# Patient Record
Sex: Male | Born: 1935 | Race: White | Hispanic: No | Marital: Married | Smoking: Never smoker
Health system: Southern US, Community
[De-identification: ages and names within clinical notes are randomized; demographics above are authoritative.]

## PROBLEM LIST (undated history)

## (undated) DIAGNOSIS — R112 Nausea with vomiting, unspecified: Secondary | ICD-10-CM

## (undated) DIAGNOSIS — M1990 Unspecified osteoarthritis, unspecified site: Secondary | ICD-10-CM

## (undated) DIAGNOSIS — E559 Vitamin D deficiency, unspecified: Secondary | ICD-10-CM

## (undated) DIAGNOSIS — M5410 Radiculopathy, site unspecified: Secondary | ICD-10-CM

## (undated) DIAGNOSIS — I2510 Atherosclerotic heart disease of native coronary artery without angina pectoris: Secondary | ICD-10-CM

## (undated) DIAGNOSIS — D649 Anemia, unspecified: Secondary | ICD-10-CM

## (undated) DIAGNOSIS — I10 Essential (primary) hypertension: Secondary | ICD-10-CM

## (undated) DIAGNOSIS — Z9889 Other specified postprocedural states: Secondary | ICD-10-CM

## (undated) HISTORY — PX: ELBOW SURGERY: SHX618

## (undated) HISTORY — PX: BACK SURGERY: SHX140

## (undated) HISTORY — PX: CARPAL TUNNEL RELEASE: SHX101

## (undated) HISTORY — PX: ROTATOR CUFF REPAIR: SHX139

## (undated) HISTORY — PX: CORONARY ANGIOPLASTY: SHX604

## (undated) HISTORY — PX: EYE SURGERY: SHX253

---

## 1996-10-11 HISTORY — PX: CARDIAC CATHETERIZATION: SHX172

## 2011-03-26 HISTORY — PX: CARDIAC CATHETERIZATION: SHX172

## 2020-02-14 ENCOUNTER — Other Ambulatory Visit: Payer: Self-pay | Admitting: Neurosurgery

## 2020-02-14 ENCOUNTER — Ambulatory Visit
Admission: RE | Admit: 2020-02-14 | Discharge: 2020-02-14 | Disposition: A | Discharge status: Home / Self Care | Payer: Medicare Other | Source: Ambulatory Visit | Attending: Neurosurgery | Admitting: Neurosurgery

## 2020-02-14 DIAGNOSIS — M5124 Other intervertebral disc displacement, thoracic region: Secondary | ICD-10-CM

## 2020-02-22 NOTE — Pre-Procedure Instructions (Signed)
Timothy Ware  02/22/2020      [facility] [address], [place] - [address] [place] [address] Phone: [phone] Fax: [phone]    Your procedure is scheduled on Dec. 3  Report to [facility] entrance A at  9:00 A.M.  Call this number if you have problems the morning of surgery:  [phone]               If any questions prior to surgery call Pre Admission Center [phone] Monday-Friday  8 AM-4 PM   Remember:  Do not eat or drink after midnight.      Take these medicines the morning of surgery with A SIP OF WATER :             Atenolol (tenormin)            Atorvastatin (lipitor)                          7 days prior to surgery STOP taking any Aspirin (unless otherwise instructed by your surgeon), Aleve, Naproxen, Ibuprofen, Motrin, Advil, Goody's, BC's, all herbal medications, fish oil, and all vitamins.                  Follow your surgeon's instructions on when to stop Aspirin.  If no instructions were given by your surgeon then you will need to call the office to get those instructions.      Do not wear jewelry.  Do not wear lotions, powders, or perfumes, or deodorant.  Do not shave 48 hours prior to surgery.  Men may shave face and neck.  Do not bring valuables to the hospital.  [facility] is not responsible for any belongings or valuables.  Contacts, dentures or bridgework may not be worn into surgery.  Leave your suitcase in the car.  After surgery it may be brought to your room.  For patients admitted to the hospital, discharge time will be determined by your treatment team.  Patients discharged the day of surgery will not be allowed to drive home.    Special instructions:   - Preparing For Surgery  Before surgery, you can play an important role. Because skin is not sterile, your skin needs to be as free of germs as possible. You can reduce the number of germs on your skin by washing with CHG  (chlorahexidine gluconate) Soap before surgery.  CHG is an antiseptic cleaner which kills germs and bonds with the skin to continue killing germs even after washing.    Oral Hygiene is also important to reduce your risk of infection.  Remember - BRUSH YOUR TEETH THE MORNING OF SURGERY WITH YOUR REGULAR TOOTHPASTE  Please do not use if you have an allergy to CHG or antibacterial soaps. If your skin becomes reddened/irritated stop using the CHG.  Do not shave (including legs and underarms) for at least 48 hours prior to first CHG shower. It is OK to shave your face.  Please follow these instructions carefully.   1. Shower the NIGHT BEFORE SURGERY and the MORNING OF SURGERY with CHG.   2. If you chose to wash your hair, wash your hair first as usual with your normal shampoo.  3. After you shampoo, rinse your hair and body thoroughly to remove the shampoo.  4. Use CHG as you would any other liquid soap. You can apply CHG directly to the skin and wash gently  with a scrungie or a clean washcloth.   5. Apply the CHG Soap to your body ONLY FROM THE NECK DOWN.  Do not use on open wounds or open sores. Avoid contact with your eyes, ears, mouth and genitals (private parts). Wash Face and genitals (private parts)  with your normal soap.  6. Wash thoroughly, paying special attention to the area where your surgery will be performed.  7. Thoroughly rinse your body with warm water from the neck down.  8. DO NOT shower/wash with your normal soap after using and rinsing off the CHG Soap.  9. Pat yourself dry with a CLEAN TOWEL.  10. Wear CLEAN PAJAMAS to bed the night before surgery, wear comfortable clothes the morning of surgery  11. Place CLEAN SHEETS on your bed the night of your first shower and DO NOT SLEEP WITH PETS.    Day of Surgery:  Do not apply any deodorants/lotions.  Please wear clean clothes to the hospital/surgery center.   Remember to brush your teeth WITH YOUR REGULAR  TOOTHPASTE.    Please read over the following fact sheets that you were given.

## 2020-02-23 ENCOUNTER — Other Ambulatory Visit: Payer: Self-pay

## 2020-02-23 ENCOUNTER — Other Ambulatory Visit (HOSPITAL_COMMUNITY)
Admission: RE | Admit: 2020-02-23 | Discharge: 2020-02-23 | Disposition: A | Discharge status: Home / Self Care | Payer: Medicare Other | Source: Ambulatory Visit | Attending: Neurosurgery | Admitting: Neurosurgery

## 2020-02-23 ENCOUNTER — Encounter (HOSPITAL_COMMUNITY)
Admission: RE | Admit: 2020-02-23 | Discharge: 2020-02-23 | Disposition: A | Discharge status: Home / Self Care | Payer: Medicare Other | Source: Ambulatory Visit | Attending: Neurosurgery | Admitting: Neurosurgery

## 2020-02-23 ENCOUNTER — Encounter (HOSPITAL_COMMUNITY): Payer: Self-pay

## 2020-02-23 DIAGNOSIS — Z01812 Encounter for preprocedural laboratory examination: Secondary | ICD-10-CM | POA: Insufficient documentation

## 2020-02-23 DIAGNOSIS — Z20822 Contact with and (suspected) exposure to covid-19: Secondary | ICD-10-CM | POA: Insufficient documentation

## 2020-02-23 DIAGNOSIS — Z01818 Encounter for other preprocedural examination: Secondary | ICD-10-CM | POA: Insufficient documentation

## 2020-02-23 DIAGNOSIS — I10 Essential (primary) hypertension: Secondary | ICD-10-CM | POA: Insufficient documentation

## 2020-02-23 HISTORY — DX: Radiculopathy, site unspecified: M54.10

## 2020-02-23 HISTORY — DX: Other specified postprocedural states: Z98.890

## 2020-02-23 HISTORY — DX: Other specified postprocedural states: R11.2

## 2020-02-23 HISTORY — DX: Anemia, unspecified: D64.9

## 2020-02-23 HISTORY — DX: Atherosclerotic heart disease of native coronary artery without angina pectoris: I25.10

## 2020-02-23 HISTORY — DX: Vitamin D deficiency, unspecified: E55.9

## 2020-02-23 HISTORY — DX: Unspecified osteoarthritis, unspecified site: M19.90

## 2020-02-23 HISTORY — DX: Essential (primary) hypertension: I10

## 2020-02-23 LAB — SURGICAL PCR SCREEN
MRSA, PCR: NEGATIVE
Staphylococcus aureus: NEGATIVE

## 2020-02-23 LAB — CBC
HCT: 42.5 % (ref 39.0–52.0)
Hemoglobin: 14.4 g/dL (ref 13.0–17.0)
MCH: 32.4 pg (ref 26.0–34.0)
MCHC: 33.9 g/dL (ref 30.0–36.0)
MCV: 95.7 fL (ref 80.0–100.0)
Platelets: 193 10*3/uL (ref 150–400)
RBC: 4.44 MIL/uL (ref 4.22–5.81)
RDW: 13 % (ref 11.5–15.5)
WBC: 8 10*3/uL (ref 4.0–10.5)
nRBC: 0 % (ref 0.0–0.2)

## 2020-02-23 LAB — BASIC METABOLIC PANEL
Anion gap: 10 (ref 5–15)
BUN: 21 mg/dL (ref 8–23)
CO2: 25 mmol/L (ref 22–32)
Calcium: 9.4 mg/dL (ref 8.9–10.3)
Chloride: 103 mmol/L (ref 98–111)
Creatinine, Ser: 1.34 mg/dL — ABNORMAL HIGH (ref 0.61–1.24)
GFR, Estimated: 53 mL/min — ABNORMAL LOW (ref >60)
Glucose, Bld: 136 mg/dL — ABNORMAL HIGH (ref 70–99)
Potassium: 4.9 mmol/L (ref 3.5–5.1)
Sodium: 138 mmol/L (ref 135–145)

## 2020-02-23 LAB — SARS CORONAVIRUS 2 (TAT 6-24 HRS): SARS Coronavirus 2: NEGATIVE

## 2020-02-23 NOTE — Progress Notes (Signed)
PCP - Dr. Corrie Mckusick, [facility] Cardiologist - Dr. Geanie Berlin- [facility]  Chest x-ray - N/A EKG - 02/23/20 Stress Test - 15+ years ago- unsure of location ECHO - denies Cardiac Cath - 01/31/12- in Care Everywhere  Sleep Study - denies  Aspirin Instructions: Patient instructed to hold all Aspirin, NSAID's, herbal medications, fish oil and vitamins 7 days prior to surgery.  Covid Test- 02/23/20 at at [address] [facility]. Pt instructed to remain in their car. Educated on Haematologist until [employer]   Anesthesia review: cardiac history  Patient denies shortness of breath, fever, cough and chest pain at PAT appointment   Patient verbalized understanding of instructions that were given to them at the PAT appointment. Patient was also instructed that they will need to review over the PAT instructions again at home before surgery.

## 2020-02-24 ENCOUNTER — Encounter (HOSPITAL_COMMUNITY): Payer: Self-pay

## 2020-02-24 NOTE — Anesthesia Preprocedure Evaluation (Addendum)
Anesthesia Evaluation  Patient identified by MRN, date of birth, ID band Patient awake    Reviewed: Allergy & Precautions, NPO status , Patient's Chart, lab work & pertinent test results  History of Anesthesia Complications (+) PONVNegative for: history of anesthetic complications  Airway Mallampati: II  TM Distance: >3 FB Neck ROM: Full    Dental  (+) Teeth Intact   Pulmonary neg pulmonary ROS,    Pulmonary exam normal        Cardiovascular hypertension, (-) angina+ CAD and + Cardiac Stents (2013)  (-) DOE Normal cardiovascular exam     Neuro/Psych negative neurological ROS  negative psych ROS   GI/Hepatic negative GI ROS, Neg liver ROS,   Endo/Other  negative endocrine ROS  Renal/GU Renal disease (Cr 1.34)  negative genitourinary   Musculoskeletal negative musculoskeletal ROS (+)   Abdominal   Peds  Hematology negative hematology ROS (+)   Anesthesia Other Findings   Reproductive/Obstetrics                          Anesthesia Physical Anesthesia Plan  ASA: III  Anesthesia Plan: General   Post-op Pain Management:    Induction: Intravenous  PONV Risk Score and Plan: 3 and Ondansetron, Dexamethasone, Treatment may vary due to age or medical condition and Midazolam  Airway Management Planned: Oral ETT  Additional Equipment: None  Intra-op Plan:   Post-operative Plan: Extubation in OR  Informed Consent: I have reviewed the patients History and Physical, chart, labs and discussed the procedure including the risks, benefits and alternatives for the proposed anesthesia with the patient or authorized representative who has indicated his/her understanding and acceptance.     Dental advisory given  Plan Discussed with:   Anesthesia Plan Comments: (See PAT note written 02/24/2020 by Shonna Chock, PA-C. )      Anesthesia Quick Evaluation

## 2020-02-24 NOTE — Progress Notes (Addendum)
Anesthesia Chart Review:  Case: [id number] Date/Time: 02/25/20 0950   Procedure: TRANSPEDICULAR MICRODISCECTOMY THORACIC 10- THORACIC 11 (N/A ) - 3C   Anesthesia type: General   Pre-op diagnosis: THORACIC DISC HERNIATION   Location: [facility] ROOM 19 / [facility] OR   Surgeons: Maeola Harman, MD      DISCUSSION: Patient is an 85 year old male scheduled for the above procedure.   History includes never smoker, post-operative N/V, CAD (RCA stent 10/29/94; s/p RCA stent x2 & Cx stent x1 10/11/96), HTN, anemia, back surgery. He reports snoring without known OSA diagnosis.   Patient sees cardiologist Dr. Rockne Menghini in [place]. Our staff and surgeon's office have been attempting to get records from [facility] but unable to get a answer or no call back yet after voice message. [facility] (hospital) had no additional contact number. I called and spoke with Mr. Cremer. His wife was also unable to speak with anyone at Dr. Oneita Jolly office. He says he is seen every six months with next visit scheduled for 04/05/20. He reports that he had burning in his throat when he required stents in 1998. He has not had any recurrent throat burning or chest pain since. He denied SOB, edema, syncope, orthopnea, palpitations. He says Dr. Rockne Menghini had wanted him to consider routine stress testing about every 4 years, but patient has declined given no symptoms and difficulty with previous stress test. He does not recall having an echo within the past several years. He is a retired [employer]. He has been having back issues for 3-4 year but more significant symptoms within 6-12 months. He can no longer stand for more than 3 minutes due to pain and numbness from his waist down, legs feel heavy. He is not able to drive a stick shift because he can't control his left leg. He used to be able to do weed eating but can't hold it any more. Symptoms are better when sitting. Denied diabetes history.    Patient's PAT visit 02/23/20, but  despite multiple attempts, we have been unable to obtain records from [facility] by 5:00 PM 02/24/20. Staff have only been able to leave a voice message. Mr. Banks does not describe any new or progressive CV symptoms. No known CHF. He reports routine cardiology follow-up about every six month. His activity is fairly limited with his spinal issues. Preoperative EKG shows NSR. Discussed with anesthesiologist Achille Rich, MD. Assigned anesthesiologist will evaluate on the day of surgery and definitive plan made at that time. Dr. Fredrich Birks staff suggested a degree of urgency for procedure given the significance and progression of symptoms. Jessica at Dr. Rush Farmer' office updated that decision to proceed as scheduled will be made following anesthesiologist evaluation of patient and further discussion with Dr. Venetia Maxon as indicated.   ADDENDUM 02/25/20 10:59 PM:  Noted that records received from [facility]. Last visit 08/11/19. Note indicates PCI RCA in 1996 and RCA/CX in 1998. (Upon further review of [facility] Care Everywhere records, Cath Lab Report was listed as being from 01/31/12 but when documents opened that dates were from 10/29/94 and 10/11/96, so I have corrected my note and history.) Last stress test 2015. No new testing ordered. To my understanding Dr. Venetia Maxon and/or his scheduler were able to speak directly with cardiologist Dr. Rockne Menghini this morning. We did received a letter from Dr. Rockne Menghini stating, "This is to inform you that the above mentioned patient is managed in our clinic for his  cardiac needs and has been cleared for his upcoming surgery from a cardiovascular standpoint."   VS: BP (!) 162/81   Pulse 83   Temp (!) 36.4 C (Oral)   Resp 18   Ht 6\' 1"  (1.854 m)   Wt 87.4 kg   SpO2 99%   BMI 25.41 kg/m   PROVIDERS: PCP - Dr. , [facility] Cardiologist - Dr. [facility], [facility] [place] [phone])   LABS: Labs reviewed: Acceptable for  surgery. (all labs ordered are listed, but only abnormal results are displayed)  Labs Reviewed  BASIC METABOLIC PANEL - Abnormal; Notable for the following components:      Result Value   Glucose, Bld 136 (*)    Creatinine, Ser 1.34 (*)    GFR, Estimated 53 (*)    All other components within normal limits  SURGICAL PCR SCREEN  CBC     EKG: 02/23/20: NSR   CV: Nuclear stress test 01/04/14 (as outlined in 08/11/19 office note by Dr. 08/13/19): "EF 65%, no ischemia.   Cardiac cath 10/11/1996 ([facility]) DIAGNOSTIC SUMMARY  Coronary Artery Disease    Left Main: normal    LAD system: insignificant    LCX system: significant    RCA system: significant    Number of vessels with significant CAD: 2 - Vessel  Left Ventriculogram    Ejection Fraction:  72%   INTERVENTIONAL SUMMARY  Lesions Attempted: 3   Lesion #1: Dist RCA 95% (pre) to <25% (post)   Lesion #2: Prox RCA 75% (pre) to <25% (post)   Lesion #3: OM2 75% (pre) to <25% (post)   COMMENT  75% proximal and distal 95% RCA and 75% OM2 lesions successfully treated   with stenting. 3 mutilink stants placed (2 RCA and 1 LCX). ReoPro   given.      Past Medical History:  Diagnosis Date  . Anemia   . Back pain with radiculopathy   . Coronary artery disease   . DJD (degenerative joint disease)   . Hypertension   . PONV (postoperative nausea and vomiting)   . Vitamin D deficiency     Past Surgical History:  Procedure Laterality Date  . BACK SURGERY    . CARDIAC CATHETERIZATION  10/11/1996   NL LM, insignificant LAD, 75% pRCA, 95% dRCA, 75% OM2, EF 72%. S/p RCA stents x2 & CX x1.  10/13/1996 CARPAL TUNNEL RELEASE Bilateral   . CORONARY ANGIOPLASTY     RCA PTCA/stent 10/29/1994; RCA stents x2, Cx x1, [facility] 10/11/1996  . ELBOW SURGERY Left   . EYE SURGERY Bilateral    cataract  . ROTATOR CUFF REPAIR Left     MEDICATIONS: . aspirin EC 81 MG tablet  . atenolol (TENORMIN) 25 MG  tablet  . atorvastatin (LIPITOR) 40 MG tablet  . B Complex-C (B-COMPLEX WITH VITAMIN C) tablet  . benazepril (LOTENSIN) 10 MG tablet  . Cholecalciferol (VITAMIN D) 50 MCG (2000 UT) tablet   No current facility-administered medications for this encounter.  ASA is on hold. He takes pills at night except benazpril.   10/13/1996, PA-C Surgical Short Stay/Anesthesiology [facility] Phone [phone] [facility] Phone [phone] 02/24/2020 5:14 PM

## 2020-02-24 NOTE — H&P (Signed)
Patient ID:   [id number] Patient: Timothy Ware  Date of Birth: Sep 12, 1934 Visit Type: Office Visit   Date: 02/16/2020 10:45 AM Provider: Danae Orleans. Venetia Maxon MD   This 85 year old male presents for CT Review.  HISTORY OF PRESENT ILLNESS: 1.  CT Review  Patient returns to review his CT.  The CT scan redemonstrates the large disc herniation at the T10/T11 level and it is only partially calcified  I reviewed the imaging with the patient, his wife, Dr. Jake Samples.  In light of the minimal degree of calcification we feel that a transpedicular resection of the disc herniation is a reasonable approach to this lesion.  Thoracotomy would have significant risks of its own, especially at his age of 63      Medical/Surgical/Interim History Reviewed, no change.  Last detailed document date:11/17/2019.     PAST MEDICAL HISTORY, SURGICAL HISTORY, FAMILY HISTORY, SOCIAL HISTORY AND REVIEW OF SYSTEMS I have reviewed the patient's past medical, surgical, family and social history as well as the comprehensive review of systems as included on the [facility] history form dated 02/14/2020, which I have signed.  Family History: Reviewed, no changes.  Last detailed document date:11/17/2019.   Social History: Reviewed, no changes. Last detailed document date: 11/17/2019.    MEDICATIONS: (added, continued or stopped this visit) Started Medication Directions Instruction Stopped  aspirin     atenolol take 1 tablet by oral route  every day    atorvastatin     benazepril take 1 tablet by oral route  every day   12/20/2019 diazepam 5 mg tablet take one tablet 30 minutes prior to MRI      ALLERGIES: Ingredient Reaction Medication Name Comment PENICILLINS     Reviewed, no changes.    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  Proceed with left T10/11 transpedicular resection of herniated disc.  He will have spinal cord monitoring during this procedure.  We discussed the potential for instrumentation but in light of his previously instrumented fusion from L2 through L5 levels, I have recommended against the use of hardware.  PLAN: T10/11 diskectomy on expedited basis due to the patient's progressive weakness and severe myelopathy  Orders: Instruction(s)/Education: Assessment Instruction I10 Lifestyle education [phone] Dietary management education, guidance, and counseling  Completed Orders (this encounter) Order Details Reason Side Interpretation Result Initial Treatment Date Region Lifestyle education Patient will follow up with Primary Care Physician.       Dietary management education, guidance, and counseling Encouraged patient to eat well balanced diet.        Assessment/Plan  # Detail Type Description  1. Assessment Thoracic disc herniation (M51.24).     2. 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     3. Assessment Essential (primary) hypertension (I10).     4. Assessment Body mass index (BMI) 25.0-25.9, adult (T02.40).  Plan Orders Today's instructions / counseling include(s) Dietary management education, guidance, and counseling. Clinical information/comments: Encouraged patient to eat well balanced diet.       Pain Management Plan Pain Scale: 0/10. Method: Numeric Pain Intensity Scale.              Provider:  Danae Orleans. Venetia Maxon MD  02/20/2020 07:03 AM    Dictation edited by: Danae Orleans. Venetia Maxon    CC Providers: Trudie Reed  Vernell Leep [address] [place],  [place]  [address]   Corrie Mckusick  [address] [place], [place] [address]               Electronically signed by Danae Orleans. Venetia Maxon MD on 02/20/2020 07:03 AM Patient ID:   [id number] Patient: Timothy Ware  Date of Birth: 1934-06-13 Visit Type: Office Visit   Date: 02/14/2020 09:15 AM Provider: Danae Orleans. Venetia Maxon MD   This 85 year old male presents for back pain and bilateral leg numbness.  HISTORY OF PRESENT ILLNESS: 1.  back pain and bilateral leg numbness  Mr. Copher is an 85 year old male who was referred for neurosurgical evaluation for low back pain and bilateral lower extremity numbness and pain.  The patient reports that he has a long history of chronic low back pain and has had 4 lumbar surgeries in the past.  He has seen multiple pain management doctors, as well as has done multiple conservative treatments that have included physical therapy and chiropractic.  He reports over the last few years he has been experiencing worsening symptoms and now reports significant pain while standing and ambulating.  He reports that the numbness in his bilateral lower extremities has been there for many years.  However, he feels as though it has been worsening.  Left is worse than right.  Currently while sitting in the exam room, he rates his pain to be a 0/10.  However, with standing and ambulation, his pain is reported to be a 10/10.  He was recently seen by Dr. Lorrine Kin and received bilateral L5-S1 transforaminal epidural steroid injections.  The patient reports that the injections made no change in his symptoms.  He was trialed in the past for a spinal cord stimulator, but due to a severe increase in his back pain during the trial, it was ultimately aborted.   12/13/2019 Bilateral L5-S1 transforaminal ESI      Medical/Surgical/Interim History Reviewed, no change.  Last detailed document date:11/17/2019.     Family History: Reviewed, no changes.  Last detailed document date:11/17/2019.   Social History: Reviewed, no changes. Last detailed document date: 11/17/2019.    MEDICATIONS: (added, continued or stopped  this visit) Started Medication Directions Instruction Stopped  aspirin     atenolol take 1 tablet by oral route  every day    atorvastatin     benazepril take 1 tablet by oral route  every day   12/20/2019 diazepam 5 mg tablet take one tablet 30 minutes prior to MRI      ALLERGIES: Ingredient Reaction Medication Name Comment PENICILLINS     Reviewed, no changes.    PHYSICAL EXAM:  Vitals Date Temp F BP Pulse Ht In Wt Lb BMI BSA Pain Score 02/14/2020  167/69 78 73 192 25.33  0/10   PHYSICAL EXAM Details General Level of Distress: no acute distress Overall Appearance: normal    Cardiovascular Cardiac: regular rate and rhythm without murmur  Respiratory Lungs: clear to auscultation  Neurological Recent and Remote Memory: normal Attention Span and Concentration:   normal Language: normal Fund of Knowledge: normal  Right Left Sensation: normal normal Upper Extremity Coordination: normal normal  Lower Extremity Coordination: normal normal  Musculoskeletal Gait and Station: normal  Right Left Upper Extremity Muscle Strength: normal normal Lower Extremity Muscle Strength: normal normal Upper Extremity Muscle Tone:  normal normal Lower Extremity Muscle Tone: normal normal   Motor Strength Upper and lower extremity motor strength was tested in the clinically  pertinent muscles. Any abnormal findings will be noted below.   Right Left Hip Flexor: 4/5 4/5 Knee Extensor: 4+/5 4/5 Knee Flexor: 4+/5 4/5 EHL: 4+/5    Deep Tendon Reflexes  Right Left Biceps: increase increase Triceps: increase increase Brachioradialis: increase increase Patellar: increase increase Achilles: increase increase  Sensory Sensation was tested at L1 to S1. Any abnormal findings will be noted below.  Right Left L5: decreased   S1: decreased   Cranial Nerves II. Optic Nerve/Visual Fields: normal III. Oculomotor: normal IV.  Trochlear: normal V. Trigeminal: normal VI. Abducens: normal VII. Facial: normal VIII. Acoustic/Vestibular: normal IX. Glossopharyngeal: normal X. Vagus: normal XI. Spinal Accessory: normal XII. Hypoglossal: normal  Motor and other Tests Lhermittes: negative Rhomberg: negative    Right Left Hoffman's: normal normal Clonus: normal normal Babinski: normal normal SLR: negative negative Patrick's Pearlean Brownie): negative negative Toe Walk: normal normal Toe Lift: normal normal Heel Walk: normal normal SI Joint: nontender nontender     DIAGNOSTIC RESULTS:  Thoracic MRI revealed a disc herniation and osteophyte complex at the T10-11 level leading to severe spinal stenosis and moderate bilateral foraminal stenosis. Leftward disc protrusion and disc osteophyte at the T11-T12 level causing spinal stenosis with severe left foraminal stenosis. T1-2 with disc protrusion causing mild spinal stenosis and moderate bilateral foraminal stenosis.  Lumbar MRI revealed a grade 1 retrolisthesis L1 on L2 with severe bilateral foraminal stenosis.  L4-5 and L5-S1 also with severe bilateral foraminal stenosis.  There is by a mild bilateral foraminal stenosis at the L3-4 level.  The prominent right paracentral osteophyte formation at the L3-4 level.    IMPRESSION:  The patient reports worsening low back pain and bilateral lower extremity radiculopathy with neurogenic claudication.  The patient's examination was most significant for bilateral lower extremity weakness and hyperreflexia.  The patient's imaging is most remarkable for a significant disc herniation at the T10-11 level causing severe spinal stenosis, bilateral foraminal stenosis, and compression of the spinal cord.  I have discussed with the patient that he will need surgery to remove the portion of disc that is causing him his symptoms.  He will need undergo a thoracic CT scan to help determine whether we are able to do an anterior or posterior  approach.  PLAN: The patient will undergo a stat thoracic CT.  He will follow up after his imaging has been completed.  Tentative plan for a transpedicular microdiskectomy at the T10-11 level on 02/25/2020 at 1:30 p.m..  Orders: Diagnostic Procedures: Assessment Procedure M51.24 CT T-Spine W/o Contrast Instruction(s)/Education: Assessment Instruction R03.0 Lifestyle education Z68.25 Dietary management education, guidance, and counseling  Completed Orders (this encounter) Order Details Reason Side Interpretation Result Initial Treatment Date Region Lifestyle education Patient will monitor and contact primary care physician if needed.       Dietary management education, guidance, and counseling Encouraged patient to eat well balanced diet.        Assessment/Plan  # Detail Type Description  1. Assessment Thoracic disc herniation (M51.24).     2. Assessment History of lumbosacral spine surgery (I43.329).     3. Assessment Thoracic spinal stenosis (M48.04).     4. Assessment Neurogenic claudication (G95.19).     5. 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     6. Assessment Elevated blood-pressure reading, w/o diagnosis of htn (R03.0).     7. Assessment Body mass index (BMI) 25.0-25.9, adult (J18.84).  Plan Orders Today's instructions / counseling include(s) Dietary management education, guidance, and counseling. Clinical information/comments: Encouraged patient to  eat well balanced diet.       Pain Management Plan Pain Scale: 0/10. Method: Numeric Pain Intensity Scale.              Provider:  Danae OrleansJoseph D. Venetia MaxonStern MD  02/14/2020 04:51 PM    Dictation edited by: Danae OrleansJoseph D. [facility]    CC Providers: Corrie Mckusickradeep Pradhan  [address] [place], [place] [id number]               Electronically signed by Danae OrleansJoseph D. Venetia MaxonStern MD on 02/14/2020 04:51 PM

## 2020-02-25 ENCOUNTER — Ambulatory Visit (HOSPITAL_COMMUNITY): Payer: Medicare Other | Admitting: Physician Assistant

## 2020-02-25 ENCOUNTER — Encounter (HOSPITAL_COMMUNITY)
Admission: RE | Disposition: A | Discharge status: Rehabilitation Facility | Payer: Self-pay | Source: Home / Self Care | Attending: Neurosurgery

## 2020-02-25 ENCOUNTER — Inpatient Hospital Stay (HOSPITAL_COMMUNITY)
Admission: RE | Admit: 2020-02-25 | Discharge: 2020-02-28 | DRG: 520 | Disposition: A | Discharge status: Rehabilitation Facility | Payer: Medicare Other | Attending: Neurosurgery | Admitting: Neurosurgery

## 2020-02-25 ENCOUNTER — Ambulatory Visit (HOSPITAL_COMMUNITY): Payer: Medicare Other | Admitting: Anesthesiology

## 2020-02-25 ENCOUNTER — Ambulatory Visit (HOSPITAL_COMMUNITY): Payer: Medicare Other

## 2020-02-25 ENCOUNTER — Encounter (HOSPITAL_COMMUNITY): Payer: Self-pay | Admitting: Neurosurgery

## 2020-02-25 ENCOUNTER — Encounter (HOSPITAL_COMMUNITY): Payer: Self-pay

## 2020-02-25 DIAGNOSIS — M5104 Intervertebral disc disorders with myelopathy, thoracic region: Principal | ICD-10-CM | POA: Diagnosis present

## 2020-02-25 DIAGNOSIS — G8918 Other acute postprocedural pain: Secondary | ICD-10-CM | POA: Diagnosis not present

## 2020-02-25 DIAGNOSIS — E46 Unspecified protein-calorie malnutrition: Secondary | ICD-10-CM | POA: Diagnosis present

## 2020-02-25 DIAGNOSIS — R269 Unspecified abnormalities of gait and mobility: Secondary | ICD-10-CM | POA: Diagnosis present

## 2020-02-25 DIAGNOSIS — M2578 Osteophyte, vertebrae: Secondary | ICD-10-CM | POA: Diagnosis present

## 2020-02-25 DIAGNOSIS — Z7982 Long term (current) use of aspirin: Secondary | ICD-10-CM

## 2020-02-25 DIAGNOSIS — Z880 Allergy status to penicillin: Secondary | ICD-10-CM | POA: Diagnosis not present

## 2020-02-25 DIAGNOSIS — Z79899 Other long term (current) drug therapy: Secondary | ICD-10-CM | POA: Diagnosis not present

## 2020-02-25 DIAGNOSIS — K592 Neurogenic bowel, not elsewhere classified: Secondary | ICD-10-CM | POA: Diagnosis not present

## 2020-02-25 DIAGNOSIS — N3000 Acute cystitis without hematuria: Secondary | ICD-10-CM | POA: Diagnosis not present

## 2020-02-25 DIAGNOSIS — R609 Edema, unspecified: Secondary | ICD-10-CM | POA: Diagnosis not present

## 2020-02-25 DIAGNOSIS — I2510 Atherosclerotic heart disease of native coronary artery without angina pectoris: Secondary | ICD-10-CM | POA: Diagnosis present

## 2020-02-25 DIAGNOSIS — R339 Retention of urine, unspecified: Secondary | ICD-10-CM | POA: Diagnosis present

## 2020-02-25 DIAGNOSIS — R799 Abnormal finding of blood chemistry, unspecified: Secondary | ICD-10-CM | POA: Diagnosis not present

## 2020-02-25 DIAGNOSIS — Z20822 Contact with and (suspected) exposure to covid-19: Secondary | ICD-10-CM | POA: Diagnosis present

## 2020-02-25 DIAGNOSIS — Z6824 Body mass index (BMI) 24.0-24.9, adult: Secondary | ICD-10-CM | POA: Diagnosis not present

## 2020-02-25 DIAGNOSIS — I10 Essential (primary) hypertension: Secondary | ICD-10-CM | POA: Diagnosis present

## 2020-02-25 DIAGNOSIS — Z955 Presence of coronary angioplasty implant and graft: Secondary | ICD-10-CM | POA: Diagnosis not present

## 2020-02-25 DIAGNOSIS — D72828 Other elevated white blood cell count: Secondary | ICD-10-CM | POA: Diagnosis not present

## 2020-02-25 DIAGNOSIS — R7989 Other specified abnormal findings of blood chemistry: Secondary | ICD-10-CM | POA: Diagnosis not present

## 2020-02-25 DIAGNOSIS — D6959 Other secondary thrombocytopenia: Secondary | ICD-10-CM | POA: Diagnosis not present

## 2020-02-25 DIAGNOSIS — I82412 Acute embolism and thrombosis of left femoral vein: Secondary | ICD-10-CM | POA: Diagnosis not present

## 2020-02-25 DIAGNOSIS — E785 Hyperlipidemia, unspecified: Secondary | ICD-10-CM | POA: Diagnosis present

## 2020-02-25 DIAGNOSIS — R0989 Other specified symptoms and signs involving the circulatory and respiratory systems: Secondary | ICD-10-CM | POA: Diagnosis not present

## 2020-02-25 DIAGNOSIS — R112 Nausea with vomiting, unspecified: Secondary | ICD-10-CM | POA: Diagnosis not present

## 2020-02-25 DIAGNOSIS — K5900 Constipation, unspecified: Secondary | ICD-10-CM | POA: Diagnosis present

## 2020-02-25 DIAGNOSIS — M4804 Spinal stenosis, thoracic region: Secondary | ICD-10-CM | POA: Diagnosis present

## 2020-02-25 DIAGNOSIS — E8809 Other disorders of plasma-protein metabolism, not elsewhere classified: Secondary | ICD-10-CM | POA: Diagnosis present

## 2020-02-25 DIAGNOSIS — D696 Thrombocytopenia, unspecified: Secondary | ICD-10-CM | POA: Diagnosis not present

## 2020-02-25 DIAGNOSIS — R748 Abnormal levels of other serum enzymes: Secondary | ICD-10-CM | POA: Diagnosis not present

## 2020-02-25 DIAGNOSIS — M5124 Other intervertebral disc displacement, thoracic region: Secondary | ICD-10-CM | POA: Diagnosis present

## 2020-02-25 DIAGNOSIS — M4316 Spondylolisthesis, lumbar region: Secondary | ICD-10-CM | POA: Diagnosis present

## 2020-02-25 DIAGNOSIS — I951 Orthostatic hypotension: Secondary | ICD-10-CM | POA: Diagnosis not present

## 2020-02-25 DIAGNOSIS — M48061 Spinal stenosis, lumbar region without neurogenic claudication: Secondary | ICD-10-CM | POA: Diagnosis present

## 2020-02-25 DIAGNOSIS — R001 Bradycardia, unspecified: Secondary | ICD-10-CM | POA: Diagnosis not present

## 2020-02-25 DIAGNOSIS — Z9889 Other specified postprocedural states: Secondary | ICD-10-CM | POA: Diagnosis not present

## 2020-02-25 DIAGNOSIS — Z4789 Encounter for other orthopedic aftercare: Secondary | ICD-10-CM | POA: Diagnosis present

## 2020-02-25 DIAGNOSIS — M7989 Other specified soft tissue disorders: Secondary | ICD-10-CM | POA: Diagnosis not present

## 2020-02-25 DIAGNOSIS — T380X5A Adverse effect of glucocorticoids and synthetic analogues, initial encounter: Secondary | ICD-10-CM | POA: Diagnosis present

## 2020-02-25 DIAGNOSIS — G479 Sleep disorder, unspecified: Secondary | ICD-10-CM | POA: Diagnosis not present

## 2020-02-25 DIAGNOSIS — I129 Hypertensive chronic kidney disease with stage 1 through stage 4 chronic kidney disease, or unspecified chronic kidney disease: Secondary | ICD-10-CM | POA: Diagnosis present

## 2020-02-25 DIAGNOSIS — R739 Hyperglycemia, unspecified: Secondary | ICD-10-CM | POA: Diagnosis present

## 2020-02-25 DIAGNOSIS — N1831 Chronic kidney disease, stage 3a: Secondary | ICD-10-CM | POA: Diagnosis not present

## 2020-02-25 DIAGNOSIS — N319 Neuromuscular dysfunction of bladder, unspecified: Secondary | ICD-10-CM | POA: Diagnosis present

## 2020-02-25 DIAGNOSIS — D62 Acute posthemorrhagic anemia: Secondary | ICD-10-CM | POA: Diagnosis not present

## 2020-02-25 DIAGNOSIS — Z419 Encounter for procedure for purposes other than remedying health state, unspecified: Secondary | ICD-10-CM

## 2020-02-25 DIAGNOSIS — N189 Chronic kidney disease, unspecified: Secondary | ICD-10-CM | POA: Diagnosis present

## 2020-02-25 DIAGNOSIS — M4714 Other spondylosis with myelopathy, thoracic region: Secondary | ICD-10-CM | POA: Diagnosis not present

## 2020-02-25 HISTORY — PX: THORACIC DISCECTOMY: SHX6113

## 2020-02-25 SURGERY — THORACIC DISCECTOMY
Anesthesia: General | Site: Spine Thoracic

## 2020-02-25 MED ORDER — THROMBIN 5000 UNITS EX SOLR
CUTANEOUS | Status: AC
  Filled 2020-02-25: qty 5000

## 2020-02-25 MED ORDER — BENAZEPRIL HCL 20 MG PO TABS
10.0000 mg | ORAL_TABLET | Freq: Every day | ORAL | Status: DC
  Administered 2020-02-26 – 2020-02-28 (×3): 10 mg via ORAL
  Filled 2020-02-25 (×3): qty 1

## 2020-02-25 MED ORDER — SUCCINYLCHOLINE CHLORIDE 200 MG/10ML IV SOSY
PREFILLED_SYRINGE | INTRAVENOUS | Status: DC | PRN
  Administered 2020-02-25: 100 mg via INTRAVENOUS

## 2020-02-25 MED ORDER — VANCOMYCIN HCL IN DEXTROSE 1-5 GM/200ML-% IV SOLN
1000.0000 mg | INTRAVENOUS | Status: AC
  Administered 2020-02-25: 1000 mg via INTRAVENOUS
  Filled 2020-02-25: qty 200

## 2020-02-25 MED ORDER — HYDROCODONE-ACETAMINOPHEN 5-325 MG PO TABS: 2.0000 | ORAL_TABLET | ORAL | Status: DC | PRN

## 2020-02-25 MED ORDER — ONDANSETRON HCL 4 MG/2ML IJ SOLN
INTRAMUSCULAR | Status: DC | PRN
  Administered 2020-02-25: 4 mg via INTRAVENOUS

## 2020-02-25 MED ORDER — LIDOCAINE-EPINEPHRINE 1 %-1:100000 IJ SOLN
INTRAMUSCULAR | Status: DC | PRN
  Administered 2020-02-25: 5 mL

## 2020-02-25 MED ORDER — DEXAMETHASONE SODIUM PHOSPHATE 10 MG/ML IJ SOLN
INTRAMUSCULAR | Status: DC | PRN
  Administered 2020-02-25: 10 mg via INTRAVENOUS

## 2020-02-25 MED ORDER — METHOCARBAMOL 500 MG PO TABS
ORAL_TABLET | ORAL | Status: AC
  Filled 2020-02-25: qty 1

## 2020-02-25 MED ORDER — MENTHOL 3 MG MT LOZG: 1.0000 | LOZENGE | OROMUCOSAL | Status: DC | PRN

## 2020-02-25 MED ORDER — DEXAMETHASONE SODIUM PHOSPHATE 10 MG/ML IJ SOLN
INTRAMUSCULAR | Status: AC
  Filled 2020-02-25: qty 1

## 2020-02-25 MED ORDER — AMISULPRIDE (ANTIEMETIC) 5 MG/2ML IV SOLN: 10.0000 mg | Freq: Once | INTRAVENOUS | Status: DC | PRN

## 2020-02-25 MED ORDER — PROPOFOL 10 MG/ML IV BOLUS
INTRAVENOUS | Status: DC | PRN
  Administered 2020-02-25: 125 mg via INTRAVENOUS
  Administered 2020-02-25 (×2): 20 mg via INTRAVENOUS

## 2020-02-25 MED ORDER — POLYETHYLENE GLYCOL 3350 17 G PO PACK: 17.0000 g | PACK | Freq: Every day | ORAL | Status: DC | PRN

## 2020-02-25 MED ORDER — CHLORHEXIDINE GLUCONATE CLOTH 2 % EX PADS: 6.0000 | MEDICATED_PAD | Freq: Once | CUTANEOUS | Status: DC

## 2020-02-25 MED ORDER — PANTOPRAZOLE SODIUM 40 MG IV SOLR
40.0000 mg | Freq: Every day | INTRAVENOUS | Status: DC
  Administered 2020-02-25: 40 mg via INTRAVENOUS
  Filled 2020-02-25: qty 40

## 2020-02-25 MED ORDER — FENTANYL CITRATE (PF) 100 MCG/2ML IJ SOLN
25.0000 ug | INTRAMUSCULAR | Status: DC | PRN
  Administered 2020-02-25: 25 ug via INTRAVENOUS

## 2020-02-25 MED ORDER — LIDOCAINE HCL (PF) 2 % IJ SOLN
INTRAMUSCULAR | Status: AC
  Filled 2020-02-25: qty 5

## 2020-02-25 MED ORDER — 0.9 % SODIUM CHLORIDE (POUR BTL) OPTIME
TOPICAL | Status: DC | PRN
  Administered 2020-02-25: 1000 mL

## 2020-02-25 MED ORDER — SODIUM CHLORIDE 0.9% FLUSH
3.0000 mL | Freq: Two times a day (BID) | INTRAVENOUS | Status: DC
  Administered 2020-02-25 – 2020-02-28 (×5): 3 mL via INTRAVENOUS

## 2020-02-25 MED ORDER — LACTATED RINGERS IV SOLN: INTRAVENOUS | Status: DC

## 2020-02-25 MED ORDER — FENTANYL CITRATE (PF) 250 MCG/5ML IJ SOLN
INTRAMUSCULAR | Status: DC | PRN
  Administered 2020-02-25 (×2): 100 ug via INTRAVENOUS
  Administered 2020-02-25: 50 ug via INTRAVENOUS

## 2020-02-25 MED ORDER — ONDANSETRON HCL 4 MG/2ML IJ SOLN: 4.0000 mg | Freq: Once | INTRAMUSCULAR | Status: DC | PRN

## 2020-02-25 MED ORDER — ALUM & MAG HYDROXIDE-SIMETH 200-200-20 MG/5ML PO SUSP: 30.0000 mL | Freq: Four times a day (QID) | ORAL | Status: DC | PRN

## 2020-02-25 MED ORDER — OXYCODONE HCL 5 MG PO TABS
5.0000 mg | ORAL_TABLET | ORAL | Status: DC | PRN
  Administered 2020-02-26: 5 mg via ORAL
  Filled 2020-02-25: qty 1

## 2020-02-25 MED ORDER — LIDOCAINE 2% (20 MG/ML) 5 ML SYRINGE
INTRAMUSCULAR | Status: DC | PRN
  Administered 2020-02-25: 60 mg via INTRAVENOUS

## 2020-02-25 MED ORDER — ORAL CARE MOUTH RINSE: 15.0000 mL | Freq: Once | OROMUCOSAL | Status: AC

## 2020-02-25 MED ORDER — BUPIVACAINE HCL (PF) 0.5 % IJ SOLN
INTRAMUSCULAR | Status: AC
  Filled 2020-02-25: qty 30

## 2020-02-25 MED ORDER — FLEET ENEMA 7-19 GM/118ML RE ENEM: 1.0000 | ENEMA | Freq: Once | RECTAL | Status: DC | PRN

## 2020-02-25 MED ORDER — GLYCOPYRROLATE PF 0.2 MG/ML IJ SOSY
PREFILLED_SYRINGE | INTRAMUSCULAR | Status: DC | PRN
  Administered 2020-02-25 (×2): .1 mg via INTRAVENOUS

## 2020-02-25 MED ORDER — DOCUSATE SODIUM 100 MG PO CAPS
100.0000 mg | ORAL_CAPSULE | Freq: Two times a day (BID) | ORAL | Status: DC
  Administered 2020-02-25 – 2020-02-28 (×6): 100 mg via ORAL
  Filled 2020-02-25 (×6): qty 1

## 2020-02-25 MED ORDER — SODIUM CHLORIDE 0.9% FLUSH: 3.0000 mL | INTRAVENOUS | Status: DC | PRN

## 2020-02-25 MED ORDER — FENTANYL CITRATE (PF) 250 MCG/5ML IJ SOLN
INTRAMUSCULAR | Status: AC
  Filled 2020-02-25: qty 5

## 2020-02-25 MED ORDER — OXYCODONE HCL 5 MG/5ML PO SOLN: 5.0000 mg | Freq: Once | ORAL | Status: DC | PRN

## 2020-02-25 MED ORDER — ASPIRIN EC 81 MG PO TBEC
81.0000 mg | DELAYED_RELEASE_TABLET | Freq: Every day | ORAL | Status: DC
  Administered 2020-02-25 – 2020-02-28 (×4): 81 mg via ORAL
  Filled 2020-02-25 (×4): qty 1

## 2020-02-25 MED ORDER — FENTANYL CITRATE (PF) 100 MCG/2ML IJ SOLN
INTRAMUSCULAR | Status: AC
  Filled 2020-02-25: qty 2

## 2020-02-25 MED ORDER — BUPIVACAINE HCL (PF) 0.5 % IJ SOLN
INTRAMUSCULAR | Status: DC | PRN
  Administered 2020-02-25: 5 mL

## 2020-02-25 MED ORDER — PHENYLEPHRINE HCL-NACL 10-0.9 MG/250ML-% IV SOLN
INTRAVENOUS | Status: DC | PRN
  Administered 2020-02-25: 50 ug/min via INTRAVENOUS
  Administered 2020-02-25: 25 ug/min via INTRAVENOUS

## 2020-02-25 MED ORDER — PHENOL 1.4 % MT LIQD: 1.0000 | OROMUCOSAL | Status: DC | PRN

## 2020-02-25 MED ORDER — ACETAMINOPHEN 325 MG PO TABS: 650.0000 mg | ORAL_TABLET | ORAL | Status: DC | PRN

## 2020-02-25 MED ORDER — KCL IN DEXTROSE-NACL 20-5-0.45 MEQ/L-%-% IV SOLN
INTRAVENOUS | Status: DC
  Filled 2020-02-25 (×5): qty 1000

## 2020-02-25 MED ORDER — PROPOFOL 1000 MG/100ML IV EMUL
INTRAVENOUS | Status: AC
  Filled 2020-02-25: qty 100

## 2020-02-25 MED ORDER — ONDANSETRON HCL 4 MG/2ML IJ SOLN: 4.0000 mg | Freq: Four times a day (QID) | INTRAMUSCULAR | Status: DC | PRN

## 2020-02-25 MED ORDER — SUCCINYLCHOLINE CHLORIDE 200 MG/10ML IV SOSY
PREFILLED_SYRINGE | INTRAVENOUS | Status: AC
  Filled 2020-02-25: qty 10

## 2020-02-25 MED ORDER — ONDANSETRON HCL 4 MG PO TABS: 4.0000 mg | ORAL_TABLET | Freq: Four times a day (QID) | ORAL | Status: DC | PRN

## 2020-02-25 MED ORDER — THROMBIN 5000 UNITS EX SOLR
OROMUCOSAL | Status: DC | PRN
  Administered 2020-02-25: 5 mL via TOPICAL

## 2020-02-25 MED ORDER — ATENOLOL 25 MG PO TABS
25.0000 mg | ORAL_TABLET | Freq: Every day | ORAL | Status: DC
  Administered 2020-02-26 – 2020-02-28 (×3): 25 mg via ORAL
  Filled 2020-02-25 (×3): qty 1

## 2020-02-25 MED ORDER — ATORVASTATIN CALCIUM 10 MG PO TABS
20.0000 mg | ORAL_TABLET | Freq: Every day | ORAL | Status: DC
  Administered 2020-02-26 – 2020-02-28 (×3): 20 mg via ORAL
  Filled 2020-02-25 (×3): qty 2

## 2020-02-25 MED ORDER — PROPOFOL 10 MG/ML IV BOLUS
INTRAVENOUS | Status: AC
  Filled 2020-02-25: qty 20

## 2020-02-25 MED ORDER — ONDANSETRON HCL 4 MG/2ML IJ SOLN
INTRAMUSCULAR | Status: AC
  Filled 2020-02-25: qty 2

## 2020-02-25 MED ORDER — LIDOCAINE-EPINEPHRINE 1 %-1:100000 IJ SOLN
INTRAMUSCULAR | Status: AC
  Filled 2020-02-25: qty 1

## 2020-02-25 MED ORDER — BISACODYL 10 MG RE SUPP: 10.0000 mg | Freq: Every day | RECTAL | Status: DC | PRN

## 2020-02-25 MED ORDER — METHOCARBAMOL 500 MG PO TABS
500.0000 mg | ORAL_TABLET | Freq: Four times a day (QID) | ORAL | Status: DC | PRN
  Administered 2020-02-25 – 2020-02-26 (×2): 500 mg via ORAL
  Filled 2020-02-25: qty 1

## 2020-02-25 MED ORDER — DEXAMETHASONE SODIUM PHOSPHATE 4 MG/ML IJ SOLN
INTRAMUSCULAR | Status: AC
  Filled 2020-02-25: qty 1

## 2020-02-25 MED ORDER — DEXAMETHASONE SODIUM PHOSPHATE 4 MG/ML IJ SOLN
4.0000 mg | Freq: Four times a day (QID) | INTRAMUSCULAR | Status: DC
  Administered 2020-02-25 – 2020-02-26 (×3): 4 mg via INTRAVENOUS
  Filled 2020-02-25 (×2): qty 1

## 2020-02-25 MED ORDER — VITAMIN D 25 MCG (1000 UNIT) PO TABS
2000.0000 [IU] | ORAL_TABLET | Freq: Every day | ORAL | Status: DC
  Administered 2020-02-26 – 2020-02-28 (×3): 2000 [IU] via ORAL
  Filled 2020-02-25 (×3): qty 2

## 2020-02-25 MED ORDER — ZOLPIDEM TARTRATE 5 MG PO TABS: 5.0000 mg | ORAL_TABLET | Freq: Every evening | ORAL | Status: DC | PRN

## 2020-02-25 MED ORDER — B COMPLEX-C PO TABS
1.0000 | ORAL_TABLET | Freq: Every day | ORAL | Status: DC
  Administered 2020-02-26 – 2020-02-28 (×3): 1 via ORAL
  Filled 2020-02-25 (×3): qty 1

## 2020-02-25 MED ORDER — OXYCODONE HCL 5 MG PO TABS: 5.0000 mg | ORAL_TABLET | Freq: Once | ORAL | Status: DC | PRN

## 2020-02-25 MED ORDER — CHLORHEXIDINE GLUCONATE 0.12 % MT SOLN
15.0000 mL | Freq: Once | OROMUCOSAL | Status: AC
  Administered 2020-02-25: 15 mL via OROMUCOSAL
  Filled 2020-02-25: qty 15

## 2020-02-25 MED ORDER — HYDROMORPHONE HCL 1 MG/ML IJ SOLN: 0.5000 mg | INTRAMUSCULAR | Status: DC | PRN

## 2020-02-25 MED ORDER — PROPOFOL 500 MG/50ML IV EMUL
INTRAVENOUS | Status: DC | PRN
  Administered 2020-02-25: 50 ug/kg/min via INTRAVENOUS

## 2020-02-25 MED ORDER — METHOCARBAMOL 1000 MG/10ML IJ SOLN
500.0000 mg | Freq: Four times a day (QID) | INTRAVENOUS | Status: DC | PRN
  Filled 2020-02-25: qty 5

## 2020-02-25 MED ORDER — ACETAMINOPHEN 650 MG RE SUPP: 650.0000 mg | RECTAL | Status: DC | PRN

## 2020-02-25 MED ORDER — SODIUM CHLORIDE 0.9 % IV SOLN: 250.0000 mL | INTRAVENOUS | Status: DC

## 2020-02-25 SURGICAL SUPPLY — 63 items
ADH SKN CLS APL DERMABOND .7 (GAUZE/BANDAGES/DRESSINGS) ×1
BIT DRILL NEURO 2X3.1 SFT TUCH (MISCELLANEOUS) ×1 IMPLANT
BLADE CLIPPER SURG (BLADE) ×4 IMPLANT
BUR ROUND FLUTED 5 RND (BURR) ×2 IMPLANT
CANISTER SUCT 3000ML PPV (MISCELLANEOUS) ×2 IMPLANT
CARTRIDGE OIL MAESTRO DRILL (MISCELLANEOUS) ×1 IMPLANT
COVER WAND RF STERILE (DRAPES) IMPLANT
DECANTER SPIKE VIAL GLASS SM (MISCELLANEOUS) ×4 IMPLANT
DERMABOND ADVANCED (GAUZE/BANDAGES/DRESSINGS) ×1
DERMABOND ADVANCED .7 DNX12 (GAUZE/BANDAGES/DRESSINGS) ×1 IMPLANT
DIFFUSER DRILL AIR PNEUMATIC (MISCELLANEOUS) ×2 IMPLANT
DRAPE LAPAROTOMY 100X72X124 (DRAPES) ×2 IMPLANT
DRAPE MICROSCOPE LEICA (MISCELLANEOUS) ×2 IMPLANT
DRILL NEURO 2X3.1 SOFT TOUCH (MISCELLANEOUS) ×2
DRSG OPSITE POSTOP 3X4 (GAUZE/BANDAGES/DRESSINGS) ×2 IMPLANT
DURAPREP 26ML APPLICATOR (WOUND CARE) ×2 IMPLANT
ELECT BLADE 4.0 EZ CLEAN MEGAD (MISCELLANEOUS) ×2
ELECT REM PT RETURN 9FT ADLT (ELECTROSURGICAL) ×2
ELECTRODE BLDE 4.0 EZ CLN MEGD (MISCELLANEOUS) ×1 IMPLANT
ELECTRODE REM PT RTRN 9FT ADLT (ELECTROSURGICAL) ×1 IMPLANT
FEE INTRAOP MONITOR IMPULS NCS (MISCELLANEOUS) ×1 IMPLANT
GAUZE 4X4 16PLY RFD (DISPOSABLE) IMPLANT
GAUZE SPONGE 4X4 12PLY STRL (GAUZE/BANDAGES/DRESSINGS) IMPLANT
GLOVE BIO SURGEON STRL SZ 6.5 (GLOVE) ×8 IMPLANT
GLOVE BIO SURGEON STRL SZ8 (GLOVE) ×4 IMPLANT
GLOVE BIOGEL PI IND STRL 7.5 (GLOVE) ×1 IMPLANT
GLOVE BIOGEL PI IND STRL 8 (GLOVE) ×2 IMPLANT
GLOVE BIOGEL PI IND STRL 8.5 (GLOVE) ×1 IMPLANT
GLOVE BIOGEL PI INDICATOR 7.5 (GLOVE) ×1
GLOVE BIOGEL PI INDICATOR 8 (GLOVE) ×2
GLOVE BIOGEL PI INDICATOR 8.5 (GLOVE) ×1
GLOVE ECLIPSE 8.0 STRL XLNG CF (GLOVE) ×2 IMPLANT
GLOVE EXAM NITRILE XL STR (GLOVE) IMPLANT
GLOVE SS BIOGEL STRL SZ 6.5 (GLOVE) ×2 IMPLANT
GLOVE SUPERSENSE BIOGEL SZ 6.5 (GLOVE) ×2
GLOVE SURG SS PI 7.0 STRL IVOR (GLOVE) ×4 IMPLANT
GLOVE SURG UNDER POLY LF SZ6.5 (GLOVE) ×4 IMPLANT
GOWN STRL REUS W/ TWL LRG LVL3 (GOWN DISPOSABLE) ×5 IMPLANT
GOWN STRL REUS W/ TWL XL LVL3 (GOWN DISPOSABLE) ×3 IMPLANT
GOWN STRL REUS W/TWL 2XL LVL3 (GOWN DISPOSABLE) ×2 IMPLANT
GOWN STRL REUS W/TWL LRG LVL3 (GOWN DISPOSABLE) ×10
GOWN STRL REUS W/TWL XL LVL3 (GOWN DISPOSABLE) ×6
HEMOSTAT POWDER KIT SURGIFOAM (HEMOSTASIS) ×2 IMPLANT
INTRAOP MONITOR FEE IMPULS NCS (MISCELLANEOUS) ×1
INTRAOP MONITOR FEE IMPULSE (MISCELLANEOUS) ×1
KIT BASIN OR (CUSTOM PROCEDURE TRAY) ×2 IMPLANT
KIT TURNOVER KIT B (KITS) ×2 IMPLANT
NEEDLE HYPO 25X1 1.5 SAFETY (NEEDLE) ×2 IMPLANT
NEEDLE SPNL 18GX3.5 QUINCKE PK (NEEDLE) ×2 IMPLANT
NS IRRIG 1000ML POUR BTL (IV SOLUTION) ×2 IMPLANT
OIL CARTRIDGE MAESTRO DRILL (MISCELLANEOUS) ×2
PACK LAMINECTOMY NEURO (CUSTOM PROCEDURE TRAY) ×2 IMPLANT
PAD ARMBOARD 7.5X6 YLW CONV (MISCELLANEOUS) ×12 IMPLANT
PATTIES SURGICAL 1X1 (DISPOSABLE) ×2 IMPLANT
SPONGE SURGIFOAM ABS GEL SZ50 (HEMOSTASIS) IMPLANT
STAPLER SKIN PROX WIDE 3.9 (STAPLE) IMPLANT
SUT VIC AB 0 CT1 18XCR BRD8 (SUTURE) ×1 IMPLANT
SUT VIC AB 0 CT1 8-18 (SUTURE) ×2
SUT VIC AB 2-0 CT1 18 (SUTURE) ×2 IMPLANT
SUT VIC AB 3-0 SH 8-18 (SUTURE) ×2 IMPLANT
TOWEL GREEN STERILE (TOWEL DISPOSABLE) ×2 IMPLANT
TOWEL GREEN STERILE FF (TOWEL DISPOSABLE) ×2 IMPLANT
WATER STERILE IRR 1000ML POUR (IV SOLUTION) ×2 IMPLANT

## 2020-02-25 NOTE — Anesthesia Postprocedure Evaluation (Signed)
Anesthesia Post Note  Patient: Timothy Ware  Procedure(s) Performed: TRANSPEDICULAR MICRODISCECTOMY THORACIC TEN THORACIC ELEVEN (N/A Spine Thoracic)     Patient location during evaluation: PACU Anesthesia Type: General Level of consciousness: awake and alert Pain management: pain level controlled Vital Signs Assessment: post-procedure vital signs reviewed and stable Respiratory status: spontaneous breathing, nonlabored ventilation, respiratory function stable and patient connected to nasal cannula oxygen Cardiovascular status: blood pressure returned to baseline and stable Postop Assessment: no apparent nausea or vomiting Anesthetic complications: no   No complications documented.  Last Vitals:  Vitals:   02/25/20 1335 02/25/20 1350  BP: 133/80 (!) 141/69  Pulse: 70 80  Resp: 17 19  Temp:    SpO2: 100% 97%    Last Pain:  Vitals:   02/25/20 1350  TempSrc:   PainSc: 4     LLE Motor Response: Purposeful movement (02/25/20 1350) LLE Sensation: Full sensation (02/25/20 1350) RLE Motor Response: Purposeful movement (02/25/20 1350) RLE Sensation: Full sensation (02/25/20 1350)      Lucretia Kern

## 2020-02-25 NOTE — Progress Notes (Addendum)
Pt is s/p TRANSPEDICULAR MICRODISCECTOMY. He doesn't have any drain in place. He will not need any further vanc due to his renal function of 47 ml/min so the pre-op vanc will remains in his system for 24 hrs.   Ulyses Southward, PharmD, BCIDP, AAHIVP, CPP Infectious Disease Pharmacist 02/25/2020 7:22 PM

## 2020-02-25 NOTE — Interval H&P Note (Signed)
History and Physical Interval Note:  02/25/2020 10:22 AM  Timothy Ware  has presented today for surgery, with the diagnosis of THORACIC DISC HERNIATION.  The various methods of treatment have been discussed with the patient and family. After consideration of risks, benefits and other options for treatment, the patient has consented to  Procedure(s) with comments: TRANSPEDICULAR MICRODISCECTOMY THORACIC 10- THORACIC 11 (N/A) - 3C as a surgical intervention.  The patient's history has been reviewed, patient examined, no change in status, stable for surgery.  I have reviewed the patient's chart and labs.  Questions were answered to the patient's satisfaction.     Dorian Heckle

## 2020-02-25 NOTE — Transfer of Care (Signed)
Immediate Anesthesia Transfer of Care Note  Patient: Timothy Ware  Procedure(s) Performed: TRANSPEDICULAR MICRODISCECTOMY THORACIC TEN THORACIC ELEVEN (N/A Spine Thoracic)  Patient Location: PACU  Anesthesia Type:General  Level of Consciousness: patient cooperative and responds to stimulation  Airway & Oxygen Therapy: Patient Spontanous Breathing and Patient connected to face mask oxygen  Post-op Assessment: Report given to RN and Post -op Vital signs reviewed and stable  Post vital signs: Reviewed and stable  Last Vitals:  Vitals Value Taken Time  BP 120/61 02/25/20 1322  Temp 36.4 C 02/25/20 1320  Pulse 72 02/25/20 1329  Resp 23 02/25/20 1329  SpO2 100 % 02/25/20 1329  Vitals shown include unvalidated device data.  Last Pain:  Vitals:   02/25/20 1320  TempSrc:   PainSc: Asleep      Patients Stated Pain Goal: 2 (02/25/20 0846)  Complications: No complications documented.

## 2020-02-25 NOTE — Brief Op Note (Signed)
02/25/2020  1:22 PM  PATIENT:  Timothy Ware  85 y.o. 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  PROCEDURE:  Procedure(s) with comments: TRANSPEDICULAR MICRODISCECTOMY THORACIC TEN THORACIC ELEVEN (N/A) - posterior with nerve monitoring and microdissection  SURGEON:  Surgeon(s) and Role:    * Langley Ingalls, MD - Primary    * Dawley, Troy C, DO - Assisting  PHYSICIAN ASSISTANT:   ASSISTANTS: Poteat, RN   ANESTHESIA:   general  EBL:  50 mL   BLOOD ADMINISTERED:none  DRAINS: none   LOCAL MEDICATIONS USED:  MARCAINE    and LIDOCAINE   SPECIMEN:  No Specimen  DISPOSITION OF SPECIMEN:  N/A  COUNTS:  YES  TOURNIQUET:  * No tourniquets in log *  DICTATION: Patient has spinal cord compression at T 10 - 11 due to a left sided partially calcified disc herniation with myelopathy and numbness and bilateral lower extremity weakness. It was elected to take him to surgery for T 10 - T 11 laminectomy and transpedicular discectomy withresection of disc herniation with spinal cord monitoring.  Procedure: Patient was brought to the operating room and following the smooth and uncomplicated induction of general endotracheal anesthesia he was placed in a prone position on the Wilson frame.His back was prepped and draped in the usual sterile fashion with betadine scrub and DuraPrep.  After prepping, a lateral radiograph radiograph was obtained to localize the  T 10 - T 11 levels. Area of planned incision was infiltrated with local lidocaine. Incision was made in the midline and carried to the lumbodorsal fascia which was incised on the left of midline and subperiosteal exposure of the T 10 - T 11 level was performed.  A second radiograph was obtained with marker at the T 10/11 level intralaminar space. The high speed drill  was used to create a laminectomy defect of T 10 and T 11 and transpedicular removal of the left T 11 pedicle was also performed. A calcified disc herniation was identified and the  and the spinal cord dura was defined and this appeared to be displaced  with significant cord compression. The disc space was entered laterally and Epstein curets and a variety of curets and ball hooks were used to push the herniated disc material into the interspace, at which point it was removed with various pituitary rongeurs.  The herniated disc material was removed with resultant decompression of the spinal cord.  This was done with a variety of microinstruments under the operating microscope.  At this point it was felt that all neural elements were well decompressed and we felt we had removed the vast majority of disc material compressing the spinal cord and certainly all the material that we felt we could safely remove .  The wounds were then irrigated with saline. Hemostasis was assured with bipolar electrocautery and surgifoam. The thoracodorsal fascia was closed with 0 Vicryl sutures the subcutaneous tissues reapproximated 2-0 Vicryl inverted sutures and the skin edges were reapproximated with 3-0 Vicryl subcuticular stitch. The wounds were dressed with demabond and occlusive dressings. Patient was extubated in the operating room and taken to recovery in stable and satisfactory condition having tolerated his operation well counts were correct at the end of the case.  Nerve monitoring demonstrated stable motor recordings with improved somatosensory recordings through the course of the surgery.     PLAN OF CARE: Admit to inpatient   PATIENT DISPOSITION:  PACU - hemodynamically stable.   Delay start of Pharmacological VTE agent (>24hrs) due to surgical blood loss or risk of bleeding: yes   

## 2020-02-25 NOTE — Progress Notes (Signed)
Patient c/o of numbness in lower extremities while getting up to the wheelchair. Dr. Venetia Maxon made aware. Sharia Reeve, NP sent to bedside to assess patient. MD would prefer patient to be  progressive level to observe patient closer. Transfer ordered entered and patient placed back in a bed. Patient's wife made aware. Patient placed back on the monitor to wait for new bed assignment.

## 2020-02-25 NOTE — Op Note (Signed)
02/25/2020  1:22 PM  PATIENT:  Timothy Ware  85 y.o. 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  PROCEDURE:  Procedure(s) with comments: TRANSPEDICULAR MICRODISCECTOMY THORACIC TEN THORACIC ELEVEN (N/A) - posterior with nerve monitoring and microdissection  SURGEON:  Surgeon(s) and Role:    Maeola Harman, MD - Primary    * Dawley, Alan Mulder, DO - Assisting  PHYSICIAN ASSISTANT:   ASSISTANTS: Poteat, RN   ANESTHESIA:   general  EBL:  50 mL   BLOOD ADMINISTERED:none  DRAINS: none   LOCAL MEDICATIONS USED:  MARCAINE    and LIDOCAINE   SPECIMEN:  No Specimen  DISPOSITION OF SPECIMEN:  N/A  COUNTS:  YES  TOURNIQUET:  * No tourniquets in log *  DICTATION: Patient has spinal cord compression at T 10 - 11 due to a left sided partially calcified disc herniation with myelopathy and numbness and bilateral lower extremity weakness. It was elected to take him to surgery for T 10 - T 11 laminectomy and transpedicular discectomy withresection of disc herniation with spinal cord monitoring.  Procedure: Patient was brought to the operating room and following the smooth and uncomplicated induction of general endotracheal anesthesia he was placed in a prone position on the Wilson frame.His back was prepped and draped in the usual sterile fashion with betadine scrub and DuraPrep.  After prepping, a lateral radiograph radiograph was obtained to localize the  T 10 - T 11 levels. Area of planned incision was infiltrated with local lidocaine. Incision was made in the midline and carried to the lumbodorsal fascia which was incised on the left of midline and subperiosteal exposure of the T 10 - T 11 level was performed.  A second radiograph was obtained with marker at the T 10/11 level intralaminar space. The high speed drill  was used to create a laminectomy defect of T 10 and T 11 and transpedicular removal of the left T 11 pedicle was also performed. A calcified disc herniation was identified and the  and the spinal cord dura was defined and this appeared to be displaced  with significant cord compression. The disc space was entered laterally and Epstein curets and a variety of curets and ball hooks were used to push the herniated disc material into the interspace, at which point it was removed with various pituitary rongeurs.  The herniated disc material was removed with resultant decompression of the spinal cord.  This was done with a variety of microinstruments under the operating microscope.  At this point it was felt that all neural elements were well decompressed and we felt we had removed the vast majority of disc material compressing the spinal cord and certainly all the material that we felt we could safely remove .  The wounds were then irrigated with saline. Hemostasis was assured with bipolar electrocautery and surgifoam. The thoracodorsal fascia was closed with 0 Vicryl sutures the subcutaneous tissues reapproximated 2-0 Vicryl inverted sutures and the skin edges were reapproximated with 3-0 Vicryl subcuticular stitch. The wounds were dressed with demabond and occlusive dressings. Patient was extubated in the operating room and taken to recovery in stable and satisfactory condition having tolerated his operation well counts were correct at the end of the case.  Nerve monitoring demonstrated stable motor recordings with improved somatosensory recordings through the course of the surgery.  PLAN OF CARE: Admit to inpatient   PATIENT DISPOSITION:  PACU - hemodynamically stable.   Delay start of Pharmacological VTE agent (>24hrs) due to surgical blood loss or risk of bleeding: yes   

## 2020-02-25 NOTE — Anesthesia Procedure Notes (Signed)
Procedure Name: Intubation Date/Time: 02/25/2020 10:36 AM Performed by: Verdie Drown, CRNA Pre-anesthesia Checklist: Patient identified, Emergency Drugs available, Suction available and Patient being monitored Patient Re-evaluated:Patient Re-evaluated prior to induction Oxygen Delivery Method: Circle System Utilized Preoxygenation: Pre-oxygenation with 100% oxygen Induction Type: IV induction Ventilation: Mask ventilation without difficulty Laryngoscope Size: Mac and 3 Grade View: Grade II Tube type: Oral Tube size: 7.5 mm Number of attempts: 1 Airway Equipment and Method: Stylet and Oral airway Placement Confirmation: ETT inserted through vocal cords under direct vision,  positive ETCO2 and breath sounds checked- equal and bilateral Secured at: 23 cm Tube secured with: Tape Dental Injury: Teeth and Oropharynx as per pre-operative assessment

## 2020-02-26 ENCOUNTER — Encounter (HOSPITAL_COMMUNITY): Payer: Self-pay | Admitting: Neurosurgery

## 2020-02-26 MED ORDER — PANTOPRAZOLE SODIUM 40 MG PO TBEC
40.0000 mg | DELAYED_RELEASE_TABLET | Freq: Every day | ORAL | Status: DC
  Administered 2020-02-26 – 2020-02-27 (×2): 40 mg via ORAL
  Filled 2020-02-26 (×2): qty 1

## 2020-02-26 MED ORDER — DEXAMETHASONE SODIUM PHOSPHATE 4 MG/ML IJ SOLN
4.0000 mg | Freq: Three times a day (TID) | INTRAMUSCULAR | Status: DC
  Administered 2020-02-26 – 2020-02-28 (×7): 4 mg via INTRAVENOUS
  Filled 2020-02-26 (×7): qty 1

## 2020-02-26 NOTE — Evaluation (Signed)
Occupational Therapy Evaluation Patient Details Name: Timothy Ware MRN: [id number] DOB: [birth year] Today's Date: 02/26/2020    History of Present Illness Pt is an 85 yo male s/p TRANSPEDICULAR MICRODISCECTOMY T10-T11. PMHx: HTN.   Clinical Impression   Pt PTA: Pt was independent prior with pain in LLE. Pt currently with numbness in entire RLE and LLE is pain free. Back pain severe in standing today. Pt leaning BLE on bed when taking 3-4 steps up toward [facility] with RW. Pt modA for stability. Pt limited by decreased strength, numbness in LLE, decreased ability to care for self and decreased strength. Pt set-upA to maxA overall for ADL tasks. Pt would greatly benefit from continued OT skilled services. OT following acutely.      Follow Up Recommendations  CIR;Supervision/Assistance - 24 hour    Equipment Recommendations  3 in 1 bedside commode    Recommendations for Other Services       Precautions / Restrictions Precautions Precautions: Back;Fall Precaution Booklet Issued: No Precaution Comments: Pt has had 4 prior back surgeries discussed back precautions Restrictions Weight Bearing Restrictions: No      Mobility Bed Mobility Overal bed mobility: Needs Assistance Bed Mobility: Rolling;Sidelying to Sit;Sit to Supine Rolling: Min guard Sidelying to sit: Min assist   Sit to supine: Mod assist   General bed mobility comments: modA for LB to manuever; minA for trunk elevation    Transfers Overall transfer level: Independent Equipment used: None                  Balance Overall balance assessment: Needs assistance   Sitting balance-Leahy Scale: Fair       Standing balance-Leahy Scale: Poor Standing balance comment: BUE external support required                           ADL either performed or assessed with clinical judgement   ADL Overall ADL's : Needs assistance/impaired Eating/Feeding: Set up   Grooming: Set up;Sitting   Upper Body Bathing:  Minimal assistance;Sitting   Lower Body Bathing: Maximal assistance;Sitting/lateral leans;Sit to/from stand   Upper Body Dressing : Minimal assistance;Sitting   Lower Body Dressing: Maximal assistance;Sitting/lateral leans;Sit to/from stand   Toilet Transfer: Moderate assistance;+2 for physical assistance;+2 for safety/equipment;Squat-pivot;RW Toilet Transfer Details (indicate cue type and reason): pt able to shuffle steps R to left and up towards [facility] Toileting- Clothing Manipulation and Hygiene: Maximal assistance;Sitting/lateral lean;Sit to/from stand       Functional mobility during ADLs: Moderate assistance;Rolling walker (taking a few steps to [facility]; BLEs leaning on bed) General ADL Comments: PT limited by decreased strength, numbness in LLE, decreased ability to care for self and decreased strength.      Vision Baseline Vision/History: No visual deficits Patient Visual Report: No change from baseline Vision Assessment?: No apparent visual deficits     Perception     Praxis      Pertinent Vitals/Pain Pain Assessment: 0-10 Pain Score: 8  Pain Location: back Pain Descriptors / Indicators: Discomfort;Numbness;Stabbing Pain Intervention(s): Limited activity within patient's tolerance;Monitored during session;Repositioned     Hand Dominance Right   Extremity/Trunk Assessment Upper Extremity Assessment Upper Extremity Assessment: Overall WFL for tasks assessed;RUE deficits/detail RUE Deficits / Details: RUE pain with shoulder flex to 90* previous injury   Lower Extremity Assessment Lower Extremity Assessment: Defer to PT evaluation;Generalized weakness;RLE deficits/detail RLE Deficits / Details: numbness in groin area and entire RLE   Cervical / Trunk Assessment Cervical / Trunk Assessment:  Other exceptions Cervical / Trunk Exceptions: s/p thoracic sx   Communication Communication Communication: No difficulties   Cognition Arousal/Alertness: Awake/alert Behavior  During Therapy: WFL for tasks assessed/performed Overall Cognitive Status: Within Functional Limits for tasks assessed                                     General Comments  VSS on RAl HR 70 at rest to 100 BPM with exertion    Exercises     Shoulder Instructions      Home Living Family/patient expects to be discharged to:: Private residence Living Arrangements: Spouse/significant other Available Help at Discharge: Family;Available 24 hours/day Type of Home: House Home Access: Stairs to enter [employer] of Steps: 1   Home Layout: One level;Laundry or work area in [employer] Shower/Tub: Tub/shower unit;Door   [employer] Toilet: Handicapped height Bathroom Accessibility: Yes How Accessible: Accessible via walker Home Equipment: Cane - single point;Walker - 2 wheels;Walker - standard;Shower seat          Prior Functioning/Environment Level of Independence: Independent with assistive device(s)        Comments: SPC occasionally        OT Problem List: Decreased strength;Decreased activity tolerance;Impaired balance (sitting and/or standing);Decreased safety awareness;Pain;Other (comment);Decreased knowledge of use of DME or AE      OT Treatment/Interventions: Self-care/ADL training;Therapeutic exercise;Energy conservation;DME and/or AE instruction;Therapeutic activities;Patient/family education;Balance training    OT Goals(Current goals can be found in the care plan section) Acute Rehab OT Goals Patient Stated Goal: to go home when I can walk OT Goal Formulation: With patient Time For Goal Achievement: 03/11/20 Potential to Achieve Goals: Good ADL Goals Pt Will Perform Grooming: with min guard assist;standing Pt Will Perform Lower Body Dressing: with min assist;sitting/lateral leans;sit to/from stand Pt Will Transfer to Toilet: with min assist;ambulating;bedside commode Additional ADL Goal #1: Pt will increase standing tolerance to  x6 mins in prep for OOB ADL.  OT Frequency: Min 2X/week   Barriers to D/C:            Co-evaluation              AM-PAC OT "6 Clicks" Daily Activity     Outcome Measure Help from another person eating meals?: None Help from another person taking care of personal grooming?: A Little Help from another person toileting, which includes using toliet, bedpan, or urinal?: A Lot Help from another person bathing (including washing, rinsing, drying)?: A Lot Help from another person to put on and taking off regular upper body clothing?: A Little Help from another person to put on and taking off regular lower body clothing?: A Lot 6 Click Score: 16   End of Session Equipment Utilized During Treatment: Gait belt;Rolling walker Nurse Communication: Mobility status  Activity Tolerance: Patient tolerated treatment well;Patient limited by pain Patient left: in bed;with call bell/phone within reach;with bed alarm set  OT Visit Diagnosis: Unsteadiness on feet (R26.81);Muscle weakness (generalized) (M62.81);Pain Pain - part of body:  (back)                Time: 1440-1505 OT Time Calculation (min): 25 min Charges:  OT General Charges $OT Visit: 1 Visit OT Evaluation $OT Eval Moderate Complexity: 1 Mod OT Treatments $Self Care/Home Management : 8-22 mins  Flora Lipps, OTR/L Acute Rehabilitation Services Pager: [phone] Office: [phone]   Kathya Wilz C 02/26/2020, 3:31 PM

## 2020-02-26 NOTE — Progress Notes (Signed)
Inpatient Rehab Admissions Coordinator Note:   Per PT/OT recommendations, pt was screened for CIR candidacy by Wolfgang Phoenix, MS, CCC-SLP.  At this time we are recommending an inpatient rehab consult.  AC will contact attending to request consult order.  Please contact me with questions.    Wolfgang Phoenix, MS, CCC-SLP Admissions Coordinator [phone] 02/26/20 5:13 PM

## 2020-02-26 NOTE — Evaluation (Signed)
Physical Therapy Evaluation Patient Details Name: Timothy Ware MRN: [id number] DOB: 1935/02/25 Today's Date: 02/26/2020   History of Present Illness  Pt is an 85 yo male s/p TRANSPEDICULAR MICRODISCECTOMY T10-T11. PMHx: HTN.  Clinical Impression  Pt presents to PT with deficits in functional mobility, gait, balance, sensation, power, and adherence to back precautions. Pt with significant numbness in RLE at this time, unable to control R knee in standing and requires R knee block to prevent knee buckling. Pt is unable to initiate gait training at this time due to falls risk. Pt will benefit from continued acute PT POC to improve mobility quality and reduce falls risk. PT recommends CIR placement at this time due to high falls risk. Pt may progress quickly if sensation returns in RLE.    Follow Up Recommendations CIR;Supervision/Assistance - 24 hour (may progress quickly with RLE sensation return)    Equipment Recommendations  Wheelchair (measurements PT)    Recommendations for Other Services       Precautions / Restrictions Precautions Precautions: Back;Fall Precaution Booklet Issued: Yes (comment) Precaution Comments: PT reviews back precautions verbally, pt able to recall from previous surgeries Restrictions Weight Bearing Restrictions: No      Mobility  Bed Mobility Overal bed mobility: Needs Assistance Bed Mobility: Supine to Sit Rolling: Min guard Sidelying to sit: Min assist Supine to sit: Supervision;HOB elevated Sit to supine: Mod assist   General bed mobility comments: PT provides cues for log roll however pt does not follow these cues and performs supine to sit    Transfers Overall transfer level: Needs assistance Equipment used: Rolling walker (2 wheeled) Transfers: Stand Pivot Transfers;Sit to/from Stand Sit to Stand: Mod assist Stand pivot transfers: Max assist       General transfer comment: pt with hypextension of R knee during SPT, R knee block provided  to prevent buckling  Ambulation/Gait Ambulation/Gait assistance:  (deferred due to R knee buckling)              Stairs            Wheelchair Mobility    Modified Rankin (Stroke Patients Only)       Balance Overall balance assessment: Needs assistance Sitting-balance support: No upper extremity supported;Feet supported Sitting balance-Leahy Scale: Good     Standing balance support: Bilateral upper extremity supported Standing balance-Leahy Scale: Poor Standing balance comment: minA with BUE support of RW                             Pertinent Vitals/Pain Pain Assessment: No/denies pain (denies pain at rest) Pain Score: 8  Pain Location: back Pain Descriptors / Indicators: Discomfort;Numbness;Stabbing Pain Intervention(s): Limited activity within patient's tolerance;Monitored during session;Repositioned    Home Living Family/patient expects to be discharged to:: Private residence Living Arrangements: Spouse/significant other Available Help at Discharge: Family;Available 24 hours/day Type of Home: House Home Access: Stairs to enter Entrance Stairs-Rails: None Entrance Stairs-Number of Steps: 1 Home Layout: One level;Laundry or work area in [employer] Equipment: Gilmer Mor - single point;Walker - 2 wheels;Walker - standard;Shower seat;Cane - quad      Prior Function Level of Independence: Independent with assistive device(s)         Comments: SPC occasionally     Hand Dominance   Dominant Hand: Right    Extremity/Trunk Assessment   Upper Extremity Assessment Upper Extremity Assessment: Defer to OT evaluation RUE Deficits / Details: RUE pain with shoulder flex to 90* previous injury  Lower Extremity Assessment Lower Extremity Assessment: RLE deficits/detail;LLE deficits/detail RLE Deficits / Details: numb in entire RLE, no sensation to light touch or pressure. Strength WFL LLE Deficits / Details: strength WFL, numb but remains able  to feel light touch    Cervical / Trunk Assessment Cervical / Trunk Assessment: Other exceptions Cervical / Trunk Exceptions: s/p thoracic sx  Communication   Communication: No difficulties  Cognition Arousal/Alertness: Awake/alert Behavior During Therapy: WFL for tasks assessed/performed Overall Cognitive Status: Within Functional Limits for tasks assessed                                 General Comments: pt recalls back precautions, does not recall log roll technique      General Comments General comments (skin integrity, edema, etc.): VSS on RA    Exercises     Assessment/Plan    PT Assessment Patient needs continued PT services  PT Problem List Decreased strength;Decreased activity tolerance;Decreased balance;Decreased mobility;Impaired sensation;Decreased safety awareness;Decreased knowledge of use of DME;Decreased knowledge of precautions       PT Treatment Interventions DME instruction;Gait training;Stair training;Therapeutic activities;Functional mobility training;Therapeutic exercise;Balance training;Neuromuscular re-education;Patient/family education    PT Goals (Current goals can be found in the Care Plan section)  Acute Rehab PT Goals Patient Stated Goal: to return to independent mobility PT Goal Formulation: With patient Time For Goal Achievement: 03/11/20 Potential to Achieve Goals: Good    Frequency Min 5X/week   Barriers to discharge        Co-evaluation               AM-PAC PT "6 Clicks" Mobility  Outcome Measure Help needed turning from your back to your side while in a flat bed without using bedrails?: A Little Help needed moving from lying on your back to sitting on the side of a flat bed without using bedrails?: A Little Help needed moving to and from a bed to a chair (including a wheelchair)?: A Lot Help needed standing up from a chair using your arms (e.g., wheelchair or bedside chair)?: A Lot Help needed to walk in  hospital room?: A Lot Help needed climbing 3-5 steps with a railing? : Total 6 Click Score: 13    End of Session   Activity Tolerance: Patient tolerated treatment well Patient left: in chair;with call bell/phone within reach;with chair alarm set Nurse Communication: Mobility status;Need for lift equipment (STEDY) PT Visit Diagnosis: Unsteadiness on feet (R26.81);Other abnormalities of gait and mobility (R26.89);Muscle weakness (generalized) (M62.81);Other symptoms and signs involving the nervous system (R29.898)    Time: 4562-5638 PT Time Calculation (min) (ACUTE ONLY): 29 min   Charges:   PT Evaluation $PT Eval Low Complexity: 1 Low          Arlyss Gandy, PT, DPT Acute Rehabilitation Pager: [phone]   Arlyss Gandy 02/26/2020, 3:52 PM

## 2020-02-26 NOTE — Progress Notes (Signed)
Subjective: Patient reports numb in right leg  Objective: Vital signs in last 24 hours: Temp:  [97.6 F (36.4 C)-98.1 F (36.7 C)] 98.1 F (36.7 C) (12/04 0802) Pulse Rate:  [56-80] 74 (12/04 0323) Resp:  [12-20] 18 (12/04 0323) BP: (120-174)/(61-88) 169/70 (12/04 0802) SpO2:  [95 %-100 %] 98 % (12/04 0323)  Intake/Output from previous day: 12/03 0701 - 12/04 0700 In: 800 [I.V.:800] Out: 585 [Urine:535; Blood:50] Intake/Output this shift: Total I/O In: 885.7 [I.V.:885.7] Out: -   Physical Exam: Full strength both lower extremities with fairly dense right leg numbness.  Dressing CDI.  Lab Results: Recent Labs    02/23/20 1030  WBC 8.0  HGB 14.4  HCT 42.5  PLT 193   BMET Recent Labs    02/23/20 1030  NA 138  K 4.9  CL 103  CO2 25  GLUCOSE 136*  BUN 21  CREATININE 1.34*  CALCIUM 9.4    Studies/Results: DG Thoracolumabar Spine  Result Date: 02/25/2020 CLINICAL DATA:  Intraoperative images for localization EXAM: THORACOLUMBAR SPINE 1V COMPARISON:  03/05/2018 FINDINGS: Two lateral views of the spine centered at the thoracolumbar junction are obtained. The first image demonstrates surgical instrumentation posterior to the T11 level, image limited by technique. The second image demonstrates surgical instrumentation posterior to the T11 vertebral body, overlying the expected region of the central canal. Posterior fusion hardware from L2 through L4 unchanged. IMPRESSION: 1. Intraoperative exam as above. Electronically Signed   By: Sharlet Salina M.D.   On: 02/25/2020 15:13    Assessment/Plan: Patient has right leg numbness, likely secondary to dorsal column dysfunction, otherwise his strength is improved.  He will need some PT and possibly Rehab.  Will taper steroids and initiate PT.  Continue hospitalization for the present.      LOS: 1 day    Dorian Heckle, MD 02/26/2020, 8:14 AM

## 2020-02-27 MED ORDER — STROKE: EARLY STAGES OF RECOVERY BOOK
Status: AC
  Filled 2020-02-27: qty 1

## 2020-02-27 NOTE — Progress Notes (Signed)
Subjective: Patient reports still numb in right leg  Objective: Vital signs in last 24 hours: Temp:  [97.6 F (36.4 C)-98.2 F (36.8 C)] 97.9 F (36.6 C) (12/05 0911) Pulse Rate:  [58-89] 71 (12/05 0911) Resp:  [17-20] 20 (12/05 0911) BP: (131-151)/(63-73) 150/73 (12/05 0911) SpO2:  [95 %-98 %] 98 % (12/05 0911)  Intake/Output from previous day: 12/04 0701 - 12/05 0700 In: 1730.2 [I.V.:1730.2] Out: 2425 [Urine:2425] Intake/Output this shift: Total I/O In: -  Out: 200 [Urine:200]  Physical Exam: Numbness is slowly improving.  Strength in LEs is full. Dressing CDI.  Lab Results: No results for input(s): WBC, HGB, HCT, PLT in the last 72 hours. BMET No results for input(s): NA, K, CL, CO2, GLUCOSE, BUN, CREATININE, CALCIUM in the last 72 hours.  Studies/Results: DG Thoracolumabar Spine  Result Date: 02/25/2020 CLINICAL DATA:  Intraoperative images for localization EXAM: THORACOLUMBAR SPINE 1V COMPARISON:  03/05/2018 FINDINGS: Two lateral views of the spine centered at the thoracolumbar junction are obtained. The first image demonstrates surgical instrumentation posterior to the T11 level, image limited by technique. The second image demonstrates surgical instrumentation posterior to the T11 vertebral body, overlying the expected region of the central canal. Posterior fusion hardware from L2 through L4 unchanged. IMPRESSION: 1. Intraoperative exam as above. Electronically Signed   By: Sharlet Salina M.D.   On: 02/25/2020 15:13    Assessment/Plan: Patient is improving.  Mobilize with PT.  Will benefit from inpt. Rehab.  (I was informed by Case Manager that patient's surgery was approved as outpatient only and not for admission.  This is patently absurd and abusive.  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  He underwent a difficult, high risk surgery with neural monitoring due to the severe spinal  cord compression.  His post-operative course is not surprising and he is actually doing well.  If this is not approved for inpatient stay, I wonder if anything will qualify.  This decision is an affront and incorrect, ill-informed and based in greed, rather than in reality or in the best interests of this patient).      LOS: 2 days    Dorian Heckle, MD 02/27/2020, 10:51 AM

## 2020-02-27 NOTE — PMR Pre-admission (Signed)
PMR Admission Coordinator Pre-Admission Assessment  Patient: Timothy Ware is an 85 y.o., male MRN: [id number] DOB: 1934-11-01 Height: '6\' 1"'  (185.4 cm) Weight: 87.1 kg  Insurance Information HMO:     PPO:      PCP:      IPA:      80/20: yes     OTHER:  PRIMARY: Medicare A & B      Policy#: [id number]      Subscriber: patient CM Name:       Phone#:      Fax#:  Pre-Cert#:       Employer:  Benefits:  Phone #:verified online via [employer] on 02/27/20      Name:  Eff. Date: Part A & B effective 05/23/01     Deduct: $1,484      Out of Pocket Max: NA      Life Max: NA CIR: 100% with Medicare approval      SNF: 100% (days 1-20), 80% (days 21-100) Outpatient: 80%     Co-Pay: 20% Home Health: 100%      Co-Pay:  DME: 80%     Co-Pay: 20% Providers: pt's choice SECONDARY: A[employer]      Policy#: [id number]     Phone#: [phone]  Financial Counselor:       Phone#:   The "Data Collection Information Summary" for patients in Inpatient Rehabilitation Facilities with attached "Privacy Act [place] Records" was provided and verbally reviewed with: Patient and Family  Emergency Contact Information Contact Information    Name Relation Home [place] Spouse [place] Daughter [phone]        Current Medical History  Patient Admitting Diagnosis: 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 Pt admitted to hospital on 02/25/20 for planned transpedicular microdisectomy T10/11 with posterior nerve monitoring and microdissection.  After surgery, noted that pt had increased numbness in his right lower extremity.  Numbness slowly improved, but continues to be persistent and cause ambulation to be difficult. Therapy evaluations completed and CIR recommended d/t functional decline.     Patient's medical record from [facility] has been reviewed by the rehabilitation admission coordinator and physician.  Past Medical History  Past Medical History:  Diagnosis Date  . Anemia   . Back pain with radiculopathy   . Coronary artery disease   . DJD (degenerative joint disease)   . Hypertension   . PONV (postoperative nausea and vomiting)   . Vitamin D deficiency     Family History   family history is not on file.  Prior Rehab/Hospitalizations Has the patient had prior rehab or hospitalizations prior to admission? No  Has the patient had major surgery during 100 days prior to admission? Yes   Current Medications  Current Facility-Administered Medications:  .  0.9 %  sodium chloride infusion, 250 mL, Intravenous, Continuous, Erline Levine, MD, Stopped at 02/25/20 1952 .  acetaminophen (TYLENOL) tablet 650 mg, 650 mg, Oral, Q4H PRN **OR** acetaminophen (TYLENOL) suppository 650 mg, 650 mg, Rectal, Q4H PRN, Erline Levine, MD .  alum & mag hydroxide-simeth (MAALOX/MYLANTA) 200-200-20 MG/5ML suspension 30 mL, 30 mL, Oral, Q6H PRN, Erline Levine, MD .  aspirin EC tablet 81 mg, 81 mg, Oral, Daily, Erline Levine, MD, 81 mg at 02/28/20 0931 .  atenolol (TENORMIN) tablet 25 mg, 25 mg, Oral, Daily, Erline Levine,  MD, 25 mg at 02/28/20 0931 .  atorvastatin (LIPITOR) tablet 20 mg, 20 mg, Oral, Daily, Erline Levine, MD, 20 mg at 02/28/20 0931 .  B-complex with vitamin C tablet 1 tablet, 1 tablet, Oral, Daily, Erline Levine, MD, 1 tablet at 02/28/20 0931 .  benazepril (LOTENSIN) tablet 10 mg, 10 mg, Oral, Daily, Erline Levine, MD, 10 mg at 02/28/20 0931 .  bisacodyl (DULCOLAX) suppository 10 mg, 10 mg, Rectal, Daily PRN, Erline Levine, MD .  cholecalciferol (VITAMIN D3) tablet 2,000 Units, 2,000 Units, Oral, Daily, Erline Levine, MD, 2,000 Units at 02/28/20 0931 .  dexamethasone (DECADRON) injection 4 mg, 4 mg, Intravenous, Q8H, Erline Levine, MD, 4 mg at 02/28/20 [phone] .   dextrose 5 % and 0.45 % NaCl with KCl 20 mEq/L infusion, , Intravenous, Continuous, Erline Levine, MD, Last Rate: 75 mL/hr at 02/28/20 1035, New Bag at 02/28/20 1035 .  docusate sodium (COLACE) capsule 100 mg, 100 mg, Oral, BID, Erline Levine, MD, 100 mg at 02/28/20 0931 .  HYDROcodone-acetaminophen (NORCO/VICODIN) 5-325 MG per tablet 2 tablet, 2 tablet, Oral, Q4H PRN, Erline Levine, MD .  HYDROmorphone (DILAUDID) injection 0.5 mg, 0.5 mg, Intravenous, Q2H PRN, Erline Levine, MD .  menthol-cetylpyridinium (CEPACOL) lozenge 3 mg, 1 lozenge, Oral, PRN **OR** phenol (CHLORASEPTIC) mouth spray 1 spray, 1 spray, Mouth/Throat, PRN, Erline Levine, MD .  methocarbamol (ROBAXIN) tablet 500 mg, 500 mg, Oral, Q6H PRN, 500 mg at 02/26/20 1001 **OR** methocarbamol (ROBAXIN) 500 mg in dextrose 5 % 50 mL IVPB, 500 mg, Intravenous, Q6H PRN, Erline Levine, MD .  ondansetron [facility]) tablet 4 mg, 4 mg, Oral, Q6H PRN **OR** ondansetron (ZOFRAN) injection 4 mg, 4 mg, Intravenous, Q6H PRN, Erline Levine, MD .  oxyCODONE (Oxy IR/ROXICODONE) immediate release tablet 5 mg, 5 mg, Oral, Q3H PRN, Erline Levine, MD, 5 mg at 02/26/20 1001 .  pantoprazole (PROTONIX) EC tablet 40 mg, 40 mg, Oral, QHS, [name], Dwayne A, RPH, 40 mg at 02/27/20 2126 .  polyethylene glycol (MIRALAX / GLYCOLAX) packet 17 g, 17 g, Oral, Daily PRN, Erline Levine, MD .  sodium chloride flush (NS) 0.9 % injection 3 mL, 3 mL, Intravenous, Q12H, Erline Levine, MD, 3 mL at 02/28/20 0932 .  sodium chloride flush (NS) 0.9 % injection 3 mL, 3 mL, Intravenous, PRN, Erline Levine, MD .  sodium phosphate (FLEET) 7-19 GM/118ML enema 1 enema, 1 enema, Rectal, Once PRN, Erline Levine, MD .  zolpidem [facility]) tablet 5 mg, 5 mg, Oral, QHS PRN, Erline Levine, MD  Patients Current Diet:  Diet Order            Diet regular Room service appropriate? Yes; Fluid consistency: Thin  Diet effective now                 Precautions /  Restrictions Precautions Precautions: Back, Fall Precaution Booklet Issued: Yes (comment) Precaution Comments: Reviewed log roll technique Restrictions Weight Bearing Restrictions: No   Has the patient had 2 or more falls or a fall with injury in the past year? No  Prior Activity Level Community (5-7x/wk): drives. typically gets out of house daily. However the last few weeks rarely got out of house  Prior Functional Level Self Care: Did the patient need help bathing, dressing, using the toilet or eating? Independent  Indoor Mobility: Did the patient need assistance with walking from room to room (with or without device)? Independent  Stairs: Did the patient need assistance with internal or external stairs (with or without device)? Pt reported not  going up/down steps  Functional Cognition: Did the patient need help planning regular tasks such as shopping or remembering to take medications? [place] Devices/Equipment: Radio producer (specify quad or straight) Home Equipment: Cane - single point, Environmental consultant - 2 wheels, Environmental consultant - standard, Civil engineer, contracting, [employer] - quad  Prior Device Use: Indicate devices/aids used by the patient prior to current illness, exacerbation or injury? cane for past 3-4 months  Current Functional Level Cognition  Overall Cognitive Status: Within Functional Limits for tasks assessed Orientation Level: Oriented X4 General Comments: pt recalls back precautions, does not recall log roll technique    Extremity Assessment (includes Sensation/Coordination)  Upper Extremity Assessment: Defer to OT evaluation RUE Deficits / Details: RUE pain with shoulder flex to 90* previous injury  Lower Extremity Assessment: RLE deficits/detail, LLE deficits/detail RLE Deficits / Details: numb in entire RLE, no sensation to light touch or pressure. Strength WFL LLE Deficits / Details: strength WFL, numb but remains able to feel light touch     ADLs  Overall ADL's : Needs assistance/impaired Eating/Feeding: Set up Grooming: Set up, Sitting Upper Body Bathing: Minimal assistance, Sitting Lower Body Bathing: Maximal assistance, Sitting/lateral leans, Sit to/from stand Upper Body Dressing : Minimal assistance, Sitting Lower Body Dressing: Maximal assistance, Sitting/lateral leans, Sit to/from stand Toilet Transfer: Moderate assistance, +2 for physical assistance, +2 for safety/equipment, Squat-pivot, RW Toilet Transfer Details (indicate cue type and reason): pt able to shuffle steps R to left and up towards [place] and Hygiene: Maximal assistance, Sitting/lateral lean, Sit to/from stand Functional mobility during ADLs: Moderate assistance, Rolling walker (taking a few steps to [facility]; BLEs leaning on bed) General ADL Comments: PT limited by decreased strength, numbness in LLE, decreased ability to care for self and decreased strength.     Mobility  Overal bed mobility: Needs Assistance Bed Mobility: Rolling, Sidelying to Sit Rolling: Min guard Sidelying to sit: Min guard, HOB elevated Supine to sit: Supervision, HOB elevated Sit to supine: Mod assist General bed mobility comments: Step by step cues for log roll technique; use of rail to get to EOB. Increased time/effort but no assist needed.    Transfers  Overall transfer level: Needs assistance Equipment used: Rolling walker (2 wheeled) Transfers: Sit to/from Stand, Stand Pivot Transfers Sit to Stand: Min assist, From elevated surface Stand pivot transfers: Min assist, +2 safety/equipment, +2 physical assistance General transfer comment: Assist to power to standing with cues for hand placement/technique. Cues for upright posture and hip/knee extension and to widen boS. Stood from [employer], from chair x1. SPT bed to chair with assist for RW management, weight shifting and foot placement. posterior lean through hips esp when fatigued.    Ambulation /  Gait / Stairs / Wheelchair Mobility  Ambulation/Gait Ambulation/Gait assistance: Min assist, +2 safety/equipment Gait Distance (Feet): 7 Feet Assistive device: Rolling walker (2 wheeled) Gait Pattern/deviations: Step-through pattern, Narrow base of support, Trunk flexed, Decreased step length - left General Gait Details: Slow, unsteady gait withi difficulty advancing RLE requiring assist for weight shifting, cues to widen BoS and for upright posture. Posterior lean through hips when fatigued. no knee buckling noted. Gait velocity: decreased    Posture / Balance Balance Overall balance assessment: Needs assistance Sitting-balance support: Feet supported, No upper extremity supported Sitting balance-Leahy Scale: Good Standing balance support: During functional activity Standing balance-Leahy Scale: Poor Standing balance comment: Requires UE support and Min guard-Min A    Special needs/care consideration Continuous Drip IV  dextrose 5% and 0.45% NaCl with KCl 120 mEq/L infusion: 75 mL/hr; 0.9% sodium chloride infusion 250 mL: 85m/hr, Skin surgical incision: back and Designated visitor LOctavia Mottola wife   Previous Home Environment (from acute therapy documentation) Living Arrangements: Spouse/significant other Available Help at Discharge: Family, Available 24 hours/day Type of Home: House Home Layout: Two level, Able to live on main level with bedroom/bathroom Alternate Level Stairs-Rails:  (pt does not go downstairs) Home Access: Stairs to enter Entrance Stairs-Rails: None Entrance Stairs-Number of Steps: 1 Bathroom Shower/Tub: TChiropodist Handicapped height Bathroom Accessibility: Yes How Accessible: Accessible via walker [place] No  Discharge Living Setting Plans for Discharge Living Setting: Patient's home Type of Home at Discharge: House Discharge Home Layout: Two level, Able to live on main level with bedroom/bathroom Alternate Level  Stairs-Rails:  (pt does not go downstairs) Discharge Home Access: Stairs to enter Entrance Stairs-Rails: None Entrance Stairs-Number of Steps: 1 Discharge Bathroom Shower/Tub: Tub/shower unit Discharge Bathroom Toilet: Handicapped height Discharge Bathroom Accessibility: Yes How Accessible: Accessible via walker Does the patient have any problems obtaining your medications?: No  Social/Family/Support Systems Patient Roles: Spouse Anticipated Caregiver: LSuzzette Righter wife and children Anticipated Caregiver's Contact Information: [phone] Availability: 24/7 (planning on quitting job ) Discharge Plan Discussed with Primary Caregiver: Yes Is Caregiver In Agreement with Plan?: Yes Does Caregiver/Family have Issues with Lodging/Transportation while Pt is in Rehab?: No  Goals Patient/Family Goal for Rehab: Mod I: PT/OT Expected length of stay: 14-18 days Pt/Family Agrees to Admission and willing to participate: Yes Program Orientation Provided & Reviewed with Pt/Caregiver Including Roles  & Responsibilities: Yes  Decrease burden of Care through IP rehab admission: NA  Possible need for SNF placement upon discharge: NA  Patient Condition: [facility] spoken with CM, and patient and spouse. I met with patient at the bedside for inpatient rehabilitation assessment.  Patient will benefit from ongoing PT and OT, can actively participate in 3 hours of therapy a day 5 days of the week, and can make measurable gains during the admission.  Patient will also benefit from the coordinated team approach during an Inpatient Acute Rehabilitation admission.  The patient will receive intensive therapy as well as Rehabilitation physician, nursing, social worker, and care management interventions.  Due to safety, skin/wound care, disease management, medication administration, pain management and patient education the patient requires 24 hour a day  rehabilitation nursing.  The patient is currently Min A+2-Min G with mobility and Min A-Max A with basic ADLs.  Discharge setting and therapy post discharge at home with home health is anticipated.  Patient has agreed to participate in the Acute Inpatient Rehabilitation Program and will admit today.  Preadmission Screen Completed By:  LBethel Born 02/28/2020 1:36 PM ______________________________________________________________________   Discussed status with Dr. KLetta Pateon 02/28/20  at 1:36 PM and received approval for admission today.  Admission Coordinator:  LBethel Born CCC-SLP, time 1:36 PM/Date 02/28/20    Assessment/Plan: Diagnosis:  Thoracic myelopathy 1. Does the need for close, 24 hr/day Medical supervision in concert with the patient's rehab needs make it unreasonable for this patient to be served in a less intensive setting? Yes 2. Co-Morbidities requiring supervision/potential complications: CAD, HTN 3. Due to bladder management, bowel management, safety, skin/wound care, disease management, medication administration, pain management and patient education, does the patient require 24 hr/day rehab nursing? Yes 4. Does the patient require coordinated care of a physician, rehab  nurse, PT, OT, and SLP to address physical and functional deficits in the context of the above medical diagnosis(es)? Yes Addressing deficits in the following areas: balance, endurance, locomotion, strength, transferring, bowel/bladder control, bathing, dressing, grooming, toileting and psychosocial support 5. Can the patient actively participate in an intensive therapy program of at least 3 hrs of therapy 5 days a week? Yes 6. The potential for patient to make measurable gains while on inpatient rehab is excellent 7. Anticipated functional outcomes upon discharge from inpatient rehab: modified independent PT, modified independent OT, n/a SLP 8. Estimated rehab length of stay to reach the  above functional goals is: 14-18d 9. Anticipated discharge destination: Home 10. Overall Rehab/Functional Prognosis: excellent   MD Signature: Charlett Blake M.D. [facility] FAAPM&R (Neuromuscular Med) Diplomate Am Board of Electrodiagnostic Med Fellow Am Board of Interventional Pain

## 2020-02-27 NOTE — Progress Notes (Signed)
Inpatient Rehab Admissions:  Inpatient Rehab Consult received.  I met with patient at the bedside for rehabilitation assessment and to discuss goals and expectations of an inpatient rehab admission.  Pt acknowledged understanding of goals and expectations. Also spoke with pt's wife, Vaughan Basta on the phone. She also acknowledged understanding of goals and expectations. Both interested in pursuing CIR. Pt appears to be an appropriate candidate for potential admission. Will continue to follow.  Signed: Gayland Curry, [place], [place] Admissions Coordinator [phone]

## 2020-02-27 NOTE — Plan of Care (Signed)

## 2020-02-28 ENCOUNTER — Encounter (HOSPITAL_COMMUNITY): Payer: Self-pay | Admitting: Neurosurgery

## 2020-02-28 ENCOUNTER — Inpatient Hospital Stay (HOSPITAL_COMMUNITY)
Admission: RE | Admit: 2020-02-28 | Discharge: 2020-03-22 | DRG: 560 | Disposition: A | Discharge status: Home Health | Payer: Medicare Other | Source: Intra-hospital | Attending: Physical Medicine & Rehabilitation | Admitting: Physical Medicine & Rehabilitation

## 2020-02-28 ENCOUNTER — Other Ambulatory Visit: Payer: Self-pay

## 2020-02-28 DIAGNOSIS — R339 Retention of urine, unspecified: Secondary | ICD-10-CM | POA: Diagnosis present

## 2020-02-28 DIAGNOSIS — Z955 Presence of coronary angioplasty implant and graft: Secondary | ICD-10-CM | POA: Diagnosis not present

## 2020-02-28 DIAGNOSIS — D72828 Other elevated white blood cell count: Secondary | ICD-10-CM | POA: Diagnosis not present

## 2020-02-28 DIAGNOSIS — K592 Neurogenic bowel, not elsewhere classified: Secondary | ICD-10-CM

## 2020-02-28 DIAGNOSIS — Z4789 Encounter for other orthopedic aftercare: Principal | ICD-10-CM

## 2020-02-28 DIAGNOSIS — M4714 Other spondylosis with myelopathy, thoracic region: Secondary | ICD-10-CM | POA: Diagnosis present

## 2020-02-28 DIAGNOSIS — E46 Unspecified protein-calorie malnutrition: Secondary | ICD-10-CM | POA: Diagnosis present

## 2020-02-28 DIAGNOSIS — G8918 Other acute postprocedural pain: Secondary | ICD-10-CM

## 2020-02-28 DIAGNOSIS — Z6824 Body mass index (BMI) 24.0-24.9, adult: Secondary | ICD-10-CM | POA: Diagnosis not present

## 2020-02-28 DIAGNOSIS — M5104 Intervertebral disc disorders with myelopathy, thoracic region: Secondary | ICD-10-CM | POA: Diagnosis present

## 2020-02-28 DIAGNOSIS — I2510 Atherosclerotic heart disease of native coronary artery without angina pectoris: Secondary | ICD-10-CM | POA: Diagnosis present

## 2020-02-28 DIAGNOSIS — I129 Hypertensive chronic kidney disease with stage 1 through stage 4 chronic kidney disease, or unspecified chronic kidney disease: Secondary | ICD-10-CM | POA: Diagnosis present

## 2020-02-28 DIAGNOSIS — T380X5A Adverse effect of glucocorticoids and synthetic analogues, initial encounter: Secondary | ICD-10-CM | POA: Diagnosis present

## 2020-02-28 DIAGNOSIS — E8809 Other disorders of plasma-protein metabolism, not elsewhere classified: Secondary | ICD-10-CM | POA: Diagnosis present

## 2020-02-28 DIAGNOSIS — D72829 Elevated white blood cell count, unspecified: Secondary | ICD-10-CM

## 2020-02-28 DIAGNOSIS — Z789 Other specified health status: Secondary | ICD-10-CM

## 2020-02-28 DIAGNOSIS — M7989 Other specified soft tissue disorders: Secondary | ICD-10-CM | POA: Diagnosis not present

## 2020-02-28 DIAGNOSIS — E785 Hyperlipidemia, unspecified: Secondary | ICD-10-CM | POA: Diagnosis present

## 2020-02-28 DIAGNOSIS — I10 Essential (primary) hypertension: Secondary | ICD-10-CM | POA: Diagnosis present

## 2020-02-28 DIAGNOSIS — R799 Abnormal finding of blood chemistry, unspecified: Secondary | ICD-10-CM

## 2020-02-28 DIAGNOSIS — I951 Orthostatic hypotension: Secondary | ICD-10-CM

## 2020-02-28 DIAGNOSIS — R739 Hyperglycemia, unspecified: Secondary | ICD-10-CM | POA: Diagnosis present

## 2020-02-28 DIAGNOSIS — N319 Neuromuscular dysfunction of bladder, unspecified: Secondary | ICD-10-CM

## 2020-02-28 DIAGNOSIS — D696 Thrombocytopenia, unspecified: Secondary | ICD-10-CM

## 2020-02-28 DIAGNOSIS — Z7982 Long term (current) use of aspirin: Secondary | ICD-10-CM

## 2020-02-28 DIAGNOSIS — D62 Acute posthemorrhagic anemia: Secondary | ICD-10-CM

## 2020-02-28 DIAGNOSIS — N189 Chronic kidney disease, unspecified: Secondary | ICD-10-CM | POA: Diagnosis present

## 2020-02-28 DIAGNOSIS — Z79899 Other long term (current) drug therapy: Secondary | ICD-10-CM

## 2020-02-28 DIAGNOSIS — R748 Abnormal levels of other serum enzymes: Secondary | ICD-10-CM | POA: Diagnosis not present

## 2020-02-28 DIAGNOSIS — K5900 Constipation, unspecified: Secondary | ICD-10-CM | POA: Diagnosis present

## 2020-02-28 DIAGNOSIS — R001 Bradycardia, unspecified: Secondary | ICD-10-CM | POA: Diagnosis not present

## 2020-02-28 DIAGNOSIS — D6959 Other secondary thrombocytopenia: Secondary | ICD-10-CM | POA: Diagnosis not present

## 2020-02-28 DIAGNOSIS — R0989 Other specified symptoms and signs involving the circulatory and respiratory systems: Secondary | ICD-10-CM | POA: Diagnosis not present

## 2020-02-28 DIAGNOSIS — R7989 Other specified abnormal findings of blood chemistry: Secondary | ICD-10-CM | POA: Diagnosis not present

## 2020-02-28 DIAGNOSIS — Z09 Encounter for follow-up examination after completed treatment for conditions other than malignant neoplasm: Secondary | ICD-10-CM

## 2020-02-28 DIAGNOSIS — R609 Edema, unspecified: Secondary | ICD-10-CM | POA: Diagnosis not present

## 2020-02-28 DIAGNOSIS — R52 Pain, unspecified: Secondary | ICD-10-CM

## 2020-02-28 MED ORDER — METHOCARBAMOL 1000 MG/10ML IJ SOLN: 500.0000 mg | Freq: Four times a day (QID) | INTRAVENOUS | Status: DC | PRN

## 2020-02-28 MED ORDER — OXYCODONE HCL 5 MG PO TABS: 5.0000 mg | ORAL_TABLET | ORAL | Status: DC | PRN

## 2020-02-28 MED ORDER — DOCUSATE SODIUM 100 MG PO CAPS
100.0000 mg | ORAL_CAPSULE | Freq: Two times a day (BID) | ORAL | Status: DC
  Administered 2020-02-28 – 2020-03-18 (×37): 100 mg via ORAL
  Filled 2020-02-28 (×38): qty 1

## 2020-02-28 MED ORDER — ATENOLOL 25 MG PO TABS
25.0000 mg | ORAL_TABLET | Freq: Every day | ORAL | Status: DC
  Administered 2020-02-29 – 2020-03-22 (×20): 25 mg via ORAL
  Filled 2020-02-28 (×23): qty 1

## 2020-02-28 MED ORDER — BISACODYL 10 MG RE SUPP
10.0000 mg | Freq: Every day | RECTAL | Status: DC | PRN
  Administered 2020-03-07 – 2020-03-18 (×2): 10 mg via RECTAL
  Filled 2020-02-28 (×4): qty 1

## 2020-02-28 MED ORDER — PANTOPRAZOLE SODIUM 40 MG PO TBEC
40.0000 mg | DELAYED_RELEASE_TABLET | Freq: Every day | ORAL | Status: DC
  Administered 2020-02-28 – 2020-03-21 (×23): 40 mg via ORAL
  Filled 2020-02-28 (×23): qty 1

## 2020-02-28 MED ORDER — ONDANSETRON HCL 4 MG PO TABS: 4.0000 mg | ORAL_TABLET | Freq: Four times a day (QID) | ORAL | Status: DC | PRN

## 2020-02-28 MED ORDER — DEXAMETHASONE 4 MG PO TABS
4.0000 mg | ORAL_TABLET | Freq: Three times a day (TID) | ORAL | Status: DC
  Administered 2020-02-28 – 2020-03-01 (×5): 4 mg via ORAL
  Filled 2020-02-28 (×5): qty 1

## 2020-02-28 MED ORDER — ASPIRIN EC 81 MG PO TBEC
81.0000 mg | DELAYED_RELEASE_TABLET | Freq: Every day | ORAL | Status: DC
  Administered 2020-02-29 – 2020-03-21 (×22): 81 mg via ORAL
  Filled 2020-02-28 (×22): qty 1

## 2020-02-28 MED ORDER — VITAMIN D 25 MCG (1000 UNIT) PO TABS
2000.0000 [IU] | ORAL_TABLET | Freq: Every day | ORAL | Status: DC
  Administered 2020-02-29 – 2020-03-22 (×23): 2000 [IU] via ORAL
  Filled 2020-02-28 (×23): qty 2

## 2020-02-28 MED ORDER — B COMPLEX-C PO TABS
1.0000 | ORAL_TABLET | Freq: Every day | ORAL | Status: DC
  Administered 2020-02-29 – 2020-03-22 (×23): 1 via ORAL
  Filled 2020-02-28 (×23): qty 1

## 2020-02-28 MED ORDER — ACETAMINOPHEN 650 MG RE SUPP: 650.0000 mg | RECTAL | Status: DC | PRN

## 2020-02-28 MED ORDER — ACETAMINOPHEN 325 MG PO TABS
650.0000 mg | ORAL_TABLET | ORAL | Status: DC | PRN
  Administered 2020-03-17 – 2020-03-22 (×4): 650 mg via ORAL
  Filled 2020-02-28 (×4): qty 2

## 2020-02-28 MED ORDER — ONDANSETRON HCL 4 MG/2ML IJ SOLN: 4.0000 mg | Freq: Four times a day (QID) | INTRAMUSCULAR | Status: DC | PRN

## 2020-02-28 MED ORDER — BENAZEPRIL HCL 5 MG PO TABS
10.0000 mg | ORAL_TABLET | Freq: Every day | ORAL | Status: DC
  Administered 2020-02-29 – 2020-03-16 (×17): 10 mg via ORAL
  Filled 2020-02-28 (×18): qty 2

## 2020-02-28 MED ORDER — HYDROCODONE-ACETAMINOPHEN 5-325 MG PO TABS: 2.0000 | ORAL_TABLET | ORAL | Status: DC | PRN

## 2020-02-28 MED ORDER — ATORVASTATIN CALCIUM 10 MG PO TABS
20.0000 mg | ORAL_TABLET | Freq: Every day | ORAL | Status: DC
  Administered 2020-02-29 – 2020-03-22 (×23): 20 mg via ORAL
  Filled 2020-02-28 (×23): qty 2

## 2020-02-28 MED ORDER — METHOCARBAMOL 500 MG PO TABS: 500.0000 mg | ORAL_TABLET | Freq: Four times a day (QID) | ORAL | Status: DC | PRN

## 2020-02-28 MED ORDER — POLYETHYLENE GLYCOL 3350 17 G PO PACK
17.0000 g | PACK | Freq: Every day | ORAL | Status: DC | PRN
  Administered 2020-03-07 – 2020-03-17 (×4): 17 g via ORAL
  Filled 2020-02-28 (×4): qty 1

## 2020-02-28 NOTE — Progress Notes (Signed)
Physical Therapy Treatment Patient Details Name: Timothy Ware MRN: [id number] DOB: Aug 19, 1934 Today's Date: 02/28/2020    History of Present Illness Pt is an 85 yo male s/p TRANSPEDICULAR MICRODISCECTOMY T10-T11. PMHx: HTN.    PT Comments    Patient progressing well towards PT goals. Tolerated standing with min A and use of RW for support with cues for hand/foot placement. Initiated gait training today with Min A of 2; assist with weight shifting to advance RLE, cues for upright posture, RW management and right foot placement using vision. Noted to have no sensation to RLE from hip all the way down to foot as well as buttocks and impaired proprioception. Instructed pt in LE there ex to perform throughout the day. Pt is a great CIR candidate. Will follow.   Follow Up Recommendations  CIR;Supervision/Assistance - 24 hour     Equipment Recommendations  None recommended by PT    Recommendations for Other Services       Precautions / Restrictions Precautions Precautions: Back;Fall Precaution Booklet Issued: Yes (comment) Precaution Comments: Reviewed log roll technique Restrictions Weight Bearing Restrictions: No    Mobility  Bed Mobility Overal bed mobility: Needs Assistance Bed Mobility: Rolling;Sidelying to Sit Rolling: Min guard Sidelying to sit: Min guard;HOB elevated       General bed mobility comments: Step by step cues for log roll technique; use of rail to get to EOB. Increased time/effort but no assist needed.  Transfers Overall transfer level: Needs assistance Equipment used: Rolling walker (2 wheeled) Transfers: Sit to/from [employer] Sit to Stand: Min assist;From elevated surface Stand pivot transfers: Min assist;+2 safety/equipment;+2 physical assistance       General transfer comment: Assist to power to standing with cues for hand placement/technique. Cues for upright posture and hip/knee extension and to widen boS. Stood from [employer], from  chair x1. SPT bed to chair with assist for RW management, weight shifting and foot placement. posterior lean through hips esp when fatigued.  Ambulation/Gait Ambulation/Gait assistance: Min assist;+2 safety/equipment Gait Distance (Feet): 7 Feet Assistive device: Rolling walker (2 wheeled) Gait Pattern/deviations: Step-through pattern;Narrow base of support;Trunk flexed;Decreased step length - left Gait velocity: decreased   General Gait Details: Slow, unsteady gait withi difficulty advancing RLE requiring assist for weight shifting, cues to widen BoS and for upright posture. Posterior lean through hips when fatigued. no knee buckling noted.   Stairs             Wheelchair Mobility    Modified Rankin (Stroke Patients Only)       Balance Overall balance assessment: Needs assistance Sitting-balance support: Feet supported;No upper extremity supported Sitting balance-Leahy Scale: Good     Standing balance support: During functional activity Standing balance-Leahy Scale: Poor Standing balance comment: Requires UE support and Min guard-Min A                            Cognition Arousal/Alertness: Awake/alert Behavior During Therapy: WFL for tasks assessed/performed Overall Cognitive Status: Within Functional Limits for tasks assessed                                        Exercises General Exercises - Lower Extremity Ankle Circles/Pumps: AROM;Both;10 reps;Supine Long Arc Quad: AROM;Both;15 reps;Seated Hip ABduction/ADduction: AROM;Both;15 reps;Seated Straight Leg Raises: AROM;Both;15 reps;Supine Hip Flexion/Marching: AROM;Both;15 reps;Seated Toe Raises: AROM;Both;15 reps;Seated Heel Raises: AROM;Both;15 reps;Seated  General Comments General comments (skin integrity, edema, etc.): Wife present during session. Noted to have no sensation of RLE and impaired proprioception at right hallux.      Pertinent Vitals/Pain Pain Assessment:  No/denies pain    Home Living                      Prior Function            PT Goals (current goals can now be found in the care plan section) Progress towards PT goals: Progressing toward goals    Frequency    Min 5X/week      PT Plan Current plan remains appropriate    Co-evaluation              AM-PAC PT "6 Clicks" Mobility   Outcome Measure  Help needed turning from your back to your side while in a flat bed without using bedrails?: None Help needed moving from lying on your back to sitting on the side of a flat bed without using bedrails?: A Little Help needed moving to and from a bed to a chair (including a wheelchair)?: A Little Help needed standing up from a chair using your arms (e.g., wheelchair or bedside chair)?: A Little Help needed to walk in hospital room?: A Lot Help needed climbing 3-5 steps with a railing? : Total 6 Click Score: 16    End of Session Equipment Utilized During Treatment: Gait belt Activity Tolerance: Patient tolerated treatment well Patient left: in chair;with call bell/phone within reach;with chair alarm set;with family/visitor present Nurse Communication: Mobility status PT Visit Diagnosis: Unsteadiness on feet (R26.81);Other abnormalities of gait and mobility (R26.89);Muscle weakness (generalized) (M62.81);Other symptoms and signs involving the nervous system (T55.732)     Time: 2025-4270 PT Time Calculation (min) (ACUTE ONLY): 27 min  Charges:  $Gait Training: 8-22 mins                     Vale Haven, PT, DPT Acute Rehabilitation Services Pager [phone] Office [phone]       Blake Divine A Lanier Ensign 02/28/2020, 12:02 PM

## 2020-02-28 NOTE — H&P (Signed)
Physical Medicine and Rehabilitation Admission H&P       HPI: Timothy Ware is an 85 year old right-handed male with history of CAD/PTCA maintained on aspirin.  Per chart review lives with spouse.  Independent with assistive device.  1 level home one-step to entry.  Family assistance as needed.  Presented 02/24/2020 with progressive weakness and back pain as well as numbness lower extremities right greater than left.  X-rays and imaging revealed thoracic disc herniation T10-T11 with thoracic myelopathy and canal stenosis.  Underwent transpedicular microdiscectomy thoracic 10-thoracic 11 02/25/2020 per Dr. Venetia Maxon.  Decadron taper as directed. No brace required. Admission chemistries showed a glucose 136 creatinine 1.34 and hemoglobin 12.1.  No postoperative labs have been ordered.  He is tolerating a regular diet.  Therapy evaluations completed and patient was admitted for a comprehensive rehab program.   Review of Systems  Constitutional: Negative for fever.  Eyes: Negative for blurred vision and double vision.  Respiratory: Negative for cough and shortness of breath.   Cardiovascular: Positive for leg swelling. Negative for chest pain and palpitations.  Gastrointestinal: Positive for constipation. Negative for heartburn and nausea.  Genitourinary: Positive for urgency. Negative for dysuria, flank pain and hematuria.  Musculoskeletal: Positive for back pain and myalgias.  Skin: Negative for rash.  Neurological: Positive for sensory change and weakness.  All other systems reviewed and are negative.       Past Medical History:  Diagnosis Date  . Anemia    . Back pain with radiculopathy    . Coronary artery disease    . DJD (degenerative joint disease)    . Hypertension    . PONV (postoperative nausea and vomiting)    . Vitamin D deficiency      Past Surgical History:  Procedure Laterality Date  . BACK SURGERY      . CARDIAC CATHETERIZATION   10/11/1996    NL LM, insignificant LAD, 75%  pRCA, 95% dRCA, 75% OM2, EF 72%. S/p RCA stents x2 & CX x1.  Marland Kitchen CARPAL TUNNEL RELEASE Bilateral    . CORONARY ANGIOPLASTY        RCA PTCA/stent 10/29/1994; RCA stents x2, Cx x1, [facility] 10/11/1996  . ELBOW SURGERY Left    . EYE SURGERY Bilateral      cataract  . ROTATOR CUFF REPAIR Left    . THORACIC DISCECTOMY N/A 02/25/2020    Procedure: TRANSPEDICULAR MICRODISCECTOMY THORACIC TEN THORACIC ELEVEN;  Surgeon: Maeola Harman, MD;  Location: [facility] OR;  Service: Neurosurgery;  Laterality: N/A;  posterior    History reviewed. No pertinent family history. Social History:  reports that he has never smoked. He has never used smokeless tobacco. He reports that he does not drink alcohol and does not use drugs. Allergies:      Allergies  Allergen Reactions  . Penicillins Hives          Medications Prior to Admission  Medication Sig Dispense Refill  . aspirin EC 81 MG tablet Take 81 mg by mouth daily. Swallow whole.      Marland Kitchen atenolol (TENORMIN) 25 MG tablet Take 25 mg by mouth daily.      Marland Kitchen atorvastatin (LIPITOR) 40 MG tablet Take 20 mg by mouth daily.      . B Complex-C (B-COMPLEX WITH VITAMIN C) tablet Take 1 tablet by mouth daily.      . benazepril (LOTENSIN) 10 MG tablet Take 10 mg by mouth daily.      . Cholecalciferol (VITAMIN D) 50 MCG (2000 UT) tablet Take  2,000 Units by mouth daily.          Drug Regimen Review Drug regimen was reviewed and remains appropriate with no significant issues identified   Home: Home Living Family/patient expects to be discharged to:: Private residence Living Arrangements: Spouse/significant other Available Help at Discharge: Family, Available 24 hours/day Type of Home: House Home Access: Stairs to enter [employer] of Steps: 1 Entrance Stairs-Rails: None Home Layout: Two level, Able to live on main level with bedroom/bathroom Alternate Level Stairs-Rails:  (pt does not go downstairs) Bathroom Shower/Tub: Engineer, manufacturing systems: Handicapped  height Bathroom Accessibility: Yes Home Equipment: Cane - single point, Environmental consultant - 2 wheels, Environmental consultant - standard, Information systems manager, [employer] - quad   Functional History: Prior Function Level of Independence: Independent with assistive device(s) Comments: SPC occasionally   Functional Status:  Mobility: Bed Mobility Overal bed mobility: Needs Assistance Bed Mobility: Supine to Sit Rolling: Min guard Sidelying to sit: Min assist Supine to sit: Supervision, HOB elevated Sit to supine: Mod assist General bed mobility comments: PT provides cues for log roll however pt does not follow these cues and performs supine to sit Transfers Overall transfer level: Needs assistance Equipment used: Rolling walker (2 wheeled) Transfers: Stand Pivot Transfers, Sit to/from Stand Sit to Stand: Mod assist Stand pivot transfers: Max assist General transfer comment: pt with hypextension of R knee during SPT, R knee block provided to prevent buckling Ambulation/Gait Ambulation/Gait assistance:  (deferred due to R knee buckling)   ADL: ADL Overall ADL's : Needs assistance/impaired Eating/Feeding: Set up Grooming: Set up, Sitting Upper Body Bathing: Minimal assistance, Sitting Lower Body Bathing: Maximal assistance, Sitting/lateral leans, Sit to/from stand Upper Body Dressing : Minimal assistance, Sitting Lower Body Dressing: Maximal assistance, Sitting/lateral leans, Sit to/from stand Toilet Transfer: Moderate assistance, +2 for physical assistance, +2 for safety/equipment, Squat-pivot, RW Toilet Transfer Details (indicate cue type and reason): pt able to shuffle steps R to left and up towards [facility] Toileting- Clothing Manipulation and Hygiene: Maximal assistance, Sitting/lateral lean, Sit to/from stand Functional mobility during ADLs: Moderate assistance, Rolling walker (taking a few steps to [facility]; BLEs leaning on bed) General ADL Comments: PT limited by decreased strength, numbness in LLE, decreased ability to  care for self and decreased strength.    Cognition: Cognition Overall Cognitive Status: Within Functional Limits for tasks assessed Orientation Level: (P) Oriented X4 Cognition Arousal/Alertness: Awake/alert Behavior During Therapy: WFL for tasks assessed/performed Overall Cognitive Status: Within Functional Limits for tasks assessed General Comments: pt recalls back precautions, does not recall log roll technique   Physical Exam: Blood pressure (!) 155/74, pulse 77, temperature (!) 97.3 F (36.3 C), temperature source Oral, resp. rate 20, height 6\' 1"  (1.854 m), weight 87.1 kg, SpO2 99 %. Physical Exam Skin:    Comments: Back incision is dressed  Neurological:     Comments: Patient is alert in no acute distress oriented x3 and follows commands.      General: No acute distress Mood and affect are appropriate Heart: Regular rate and rhythm no rubs murmurs or extra sounds Lungs: Clear to auscultation, breathing unlabored, no rales or wheezes Abdomen: Positive bowel sounds, soft nontender to palpation, nondistended Extremities: No clubbing, cyanosis, or edema Skin: No evidence of breakdown, no evidence of rash Neurologic: Cranial nerves II through XII intact, motor strength is 5/5 in bilateral deltoid, bicep, tricep, grip, hip flexor, knee extensors, ankle dorsiflexor and plantar flexor Sensory exam absent sensation to light touch and intact proprioception in right  lower ext,  reduced LT in Left 5th toe, normal sensation BUE Cerebellar exam limited due to shoulder ROM in BUE Musculoskeletal: L>R shoulder ROM loss 0-90 deg with bad and flexion  No joint swelling   Lab Results Last 48 Hours  No results found for this or any previous visit (from the past 48 hour(s)).   Imaging Results (Last 48 hours)  No results found.           Medical Problem List and Plan: 1.  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  Status post transpedicular microdiscectomy  T10-T11 02/25/2020.  Decadron taper.No brace required             -patient may not shower             -ELOS/Goals: Mod I , 14-18d 2.  Antithrombotics: -DVT/anticoagulation: SCDs.  Check vascular study             -antiplatelet therapy: Aspirin 81 mg daily 3. Pain Management: Hydrocodone and Robaxin as needed 4. Mood: Provide emotional support             -antipsychotic agents: N/A 5. Neuropsych: This patient is capable of making decisions on his own behalf. 6. Skin/Wound Care: Routine skin checks 7. Fluids/Electrolytes/Nutrition: Routine in and outs with follow-up chemistries 8.  Hypertension.  Tenormin 25 mg daily, Lotensin 10 mg daily.  Monitor with increased mobility 9.  CAD with PTCA.  Aspirin 81 mg daily. 10.  Hyperlipidemia.  Lipitor        Mcarthur Rossetti Angiulli, PA-C 02/28/2020 "I have personally performed a face to face diagnostic evaluation of this patient.  Additionally, I have reviewed and concur with the physician assistant's documentation above." Erick Colace M.D. [facility] FAAPM&R (Neuromuscular Med) Diplomate Am Board of Electrodiagnostic Med Fellow Am Board of Interventional Pain

## 2020-02-28 NOTE — Progress Notes (Signed)
Subjective: Patient reports that he is doing well overall. RLE numbness persists and is making ambulation difficult.   Objective: Vital signs in last 24 hours: Temp:  [97.3 F (36.3 C)-98.7 F (37.1 C)] 97.3 F (36.3 C) (12/06 0840) Pulse Rate:  [61-77] 77 (12/06 0840) Resp:  [17-20] 20 (12/06 0840) BP: (141-155)/(69-80) 155/74 (12/06 0840) SpO2:  [96 %-99 %] 99 % (12/06 0840)  Intake/Output from previous day: 12/05 0701 - 12/06 0700 In: 1901.6 [P.O.:480; I.V.:1421.6] Out: 2535 [Urine:2235; Stool:300] Intake/Output this shift: No intake/output data recorded.  Physical Exam: Awake, alert, and conversant. Patient is doing well. MAEW with good power. Dressing CDI. RLE numbness persists.   Lab Results: No results for input(s): WBC, HGB, HCT, PLT in the last 72 hours. BMET No results for input(s): NA, K, CL, CO2, GLUCOSE, BUN, CREATININE, CALCIUM in the last 72 hours.  Studies/Results: No results found.  Assessment/Plan: Patient is continuing to slowly improve. LE numbness persists and is making ambulation difficult for the patient. He would benefit from CIR. Awaiting CIR evaluation and placement. Continue encouraging mobility and ambulation.   LOS: 3 days     Council Mechanic, DNP, NP-C 02/28/2020, 9:41 AM

## 2020-02-28 NOTE — IPOC Note (Signed)
Individualized overall Plan of Care [facility]) Patient Details Name: Timothy Ware MRN: [id number] DOB: 04-Dec-1934  Admitting Diagnosis: HNP (herniated nucleus pulposus with myelopathy), thoracic  Hospital Problems: Principal Problem:   HNP (herniated nucleus pulposus with myelopathy), thoracic Active Problems:   Thoracic myelopathy   Hypoalbuminemia due to protein-calorie malnutrition [facility])   Essential hypertension   Postoperative pain     Functional Problem List: Nursing Motor, Pain, Safety, Skin Integrity  PT Balance, Endurance, Motor, Pain, Sensory, Skin Integrity  OT Balance, Sensory, Endurance, Motor  SLP    TR         Basic ADL's: OT Grooming, Bathing, Dressing, Toileting     Advanced  ADL's: OT       Transfers: PT Bed Mobility, Bed to Chair, Customer service manager, Tub/Shower     Locomotion: PT Ambulation, Stairs     Additional Impairments: OT None  SLP        TR      Anticipated Outcomes Item Anticipated Outcome  Self Feeding    Swallowing      Basic self-care  supervision  Toileting  supervision   Bathroom Transfers supervision  Bowel/Bladder   (Patient will remain continent until discharge)  Transfers  Supervision with LRAD  Locomotion  Supervision with LRAD  Communication     Cognition     Pain   (Patient will have a 2 out of 10 on pain scale)  Safety/Judgment   (Patient will remain free of falls until discharge.)   Therapy Plan: PT Intensity: Minimum of 1-2 x/day ,45 to 90 minutes PT Frequency: 5 out of 7 days PT Duration Estimated Length of Stay: 2 weeks OT Intensity: Minimum of 1-2 x/day, 45 to 90 minutes OT Frequency: 5 out of 7 days OT Duration/Estimated Length of Stay: 2 weeks      Team Interventions: Nursing Interventions Pain Management, Discharge Planning, Patient/Family Education  PT interventions Ambulation/gait training, Warden/ranger, Community reintegration, Development worker, international aid stimulation, Equities trader  education, Museum/gallery curator, UE/LE Coordination activities, UE/LE Strength taining/ROM, Splinting/orthotics, Pain management, DME/adaptive equipment instruction, Cognitive remediation/compensation, Disease management/prevention, Neuromuscular re-education, Skin care/wound management, Therapeutic Exercise, Wheelchair propulsion/positioning, Visual/perceptual remediation/compensation, Therapeutic Activities, Psychosocial support, Functional mobility training, Discharge planning  OT Interventions Balance/vestibular training, Discharge planning, Self Care/advanced ADL retraining, Therapeutic Activities, UE/LE Coordination activities, Pain management, Functional mobility training, Disease mangement/prevention, Patient/family education, Therapeutic Exercise, UE/LE Strength taining/ROM, Psychosocial support, Neuromuscular re-education, DME/adaptive equipment instruction, Wheelchair propulsion/positioning  SLP Interventions    TR Interventions    SW/CM Interventions Discharge Planning, Psychosocial Support, Patient/Family Education   Barriers to Discharge MD  Medical stability  Nursing      PT Home environment Best boy, Insurance for SNF coverage, Decreased caregiver support    OT      SLP      SW Decreased caregiver support Wife still working as crossing guard at this time-gone for a few hours each day   Team Discharge Planning: Destination: PT-Home ,OT- Home , SLP-  Projected Follow-up: PT-24 hour supervision/assistance, Other (comment) (HHPT vs OPPT pending transportation and progress), OT-  24 hour supervision/assistance, SLP-  Projected Equipment Needs: PT-To be determined, OT- To be determined, SLP-  Equipment Details: PT- , OT-  Patient/family involved in discharge planning: PT- Patient,  OT-Patient, SLP-   MD ELOS: 12-15 days. Medical Rehab Prognosis:  Excellent Assessment: Right-handed male with history of CAD/PTCA maintained on aspirin. He presented 02/24/2020 with progressive  weakness and back pain as well as numbness lower extremities right greater than left.  X-rays  and imaging revealed thoracic disc herniation T10-T11 with thoracic myelopathy and canal stenosis.  Underwent transpedicular microdiscectomy thoracic 10-thoracic 11 02/25/2020 per Dr. Venetia Maxon.  Decadron taper as directed. No brace required. Admission chemistries showed a glucose 136 creatinine 1.34 and hemoglobin 12.1. He is tolerating a regular diet. Patient with resulting functional deficits with mobility, transfers, self-care.  Will set goals for Supervision with PT/OT.   Due to the current state of emergency, patients may not be receiving their 3-hours of Medicare-mandated therapy.  See Team Conference Notes for weekly updates to the plan of care

## 2020-02-28 NOTE — Discharge Summary (Signed)
Physician Discharge Summary  Patient ID: Wesson Stith MRN: [id number] DOB/AGE: 1934/06/29 85 y.o.  Admit date: 02/25/2020 Discharge date: 02/28/2020  Admission Diagnoses: THORACIC DISC HERNIATION T 10 - T 11 with thoracic myelopathy and canal stenosis  Discharge Diagnoses: THORACIC DISC HERNIATION T 10 - T 11 with thoracic myelopathy and canal stenosis Active Problems:   Displacement of thoracic intervertebral disc with myelopathy   Thoracic disc herniation   Discharged Condition: good  Hospital Course: The patient was admitted on 02/25/2020 and taken to the operating room where the patient underwent transpedicular microdiscectomy T10/11 with posterior nerve monitoring and microdissection. The patient tolerated the procedure well and was taken to the recovery room and then to the floor in stable condition. After surgery, it was noted that the patient had an increased numbness in his RLE. He was started on IV steroids. The numbness slowly improved throughout his admission, but continues to be persistent and make ambulation difficult. The wound remained clean dry and intact. Pt had appropriate back soreness. No complaints of leg pain or new weakness. The patient remained afebrile with stable vital signs, and tolerated a regular diet. The patient continued to increase activities as tolerated, and pain was well controlled with oral pain medications. He is ready for discharge to CIR.  Consults: None  Significant Diagnostic Studies: radiology: X-Ray  Treatments: surgery: TRANSPEDICULAR MICRODISCECTOMY THORACIC TEN THORACIC ELEVEN (N/A) - posterior with nerve monitoring and microdissection  Discharge Exam: Blood pressure 132/76, pulse 65, temperature 97.7 F (36.5 C), temperature source Oral, resp. rate 18, height 6\' 1"  (1.854 m), weight 87.1 kg, SpO2 98 %. Physical Exam: Awake, alert, and conversant. Patient is doing well. MAEW with good power. Dressing CDI. RLE numbness persists.    Disposition: Discharge disposition: [facility] Not Defined        Allergies as of 02/28/2020      Reactions   Penicillins Hives      Medication List    TAKE these medications   aspirin EC 81 MG tablet Take 81 mg by mouth daily. Swallow whole.   atenolol 25 MG tablet Commonly known as: TENORMIN Take 25 mg by mouth daily.   atorvastatin 40 MG tablet Commonly known as: LIPITOR Take 20 mg by mouth daily.   B-complex with vitamin C tablet Take 1 tablet by mouth daily.   benazepril 10 MG tablet Commonly known as: LOTENSIN Take 10 mg by mouth daily.   Vitamin D 50 MCG (2000 UT) tablet Take 2,000 Units by mouth daily.        Signed: 14/08/2019, DNP, NP-C 02/28/2020, 12:40 PM

## 2020-02-28 NOTE — Progress Notes (Signed)
Occupational Therapy Treatment Patient Details Name: Timothy Ware MRN: [id number] DOB: 1935-02-03 Today's Date: 02/28/2020    History of present illness Pt is an 85 yo male s/p TRANSPEDICULAR MICRODISCECTOMY T10-T11. PMHx: HTN.   OT comments  Pt seen in conjunction with PT to maximize pts activity tolerance and to progress pt towards functional mobility goals. Pt able to take steps this session with MIN A +2 with RW. Pt noted to have increased posterior lean, especially with fatigue and reports no sensation in RLE. Pt with difficulty advancing RLE during mobility but responds well to tactile cues and physical assist to shift weight to L in order to progress RLE. Pt currently requires MINA to achieve figure position on RLE for LB dressing and c/o pain in L shoulder, ? Frozen shoulder. Pt would continue to benefit from skilled occupational therapy while admitted and after d/c to address the below listed limitations in order to improve overall functional mobility and facilitate independence with BADL participation. DC plan remains appropriate, will follow acutely per POC.    Follow Up Recommendations  CIR;Supervision/Assistance - 24 hour    Equipment Recommendations  3 in 1 bedside commode    Recommendations for Other Services      Precautions / Restrictions Precautions Precautions: Back;Fall Precaution Booklet Issued: Yes (comment) Precaution Comments: Reviewed log roll technique Restrictions Weight Bearing Restrictions: No       Mobility Bed Mobility Overal bed mobility: Needs Assistance Bed Mobility: Rolling;Sidelying to Sit Rolling: Min guard Sidelying to sit: Min guard;HOB elevated       General bed mobility comments: Step by step cues for log roll technique; use of rail to get to EOB. Increased time/effort but no assist needed.  Transfers Overall transfer level: Needs assistance Equipment used: Rolling walker (2 wheeled) Transfers: Sit to/from [employer] Sit to Stand: Min assist;From elevated surface Stand pivot transfers: Min assist;+2 safety/equipment;+2 physical assistance       General transfer comment: Assist to power to standing with cues for hand placement/technique. Cues for upright posture and hip/knee extension and to widen boS. Stood from [employer], from chair x1. SPT bed to chair with assist for RW management, weight shifting and foot placement. posterior lean through hips esp when fatigued.    Balance Overall balance assessment: Needs assistance Sitting-balance support: Feet supported;No upper extremity supported Sitting balance-Leahy Scale: Good     Standing balance support: During functional activity Standing balance-Leahy Scale: Poor Standing balance comment: Requires UE support and Min guard-Min A                           ADL either performed or assessed with clinical judgement   ADL Overall ADL's : Needs assistance/impaired             Lower Body Bathing: Minimal assistance;Sitting/lateral leans Lower Body Bathing Details (indicate cue type and reason): simulated via LB Dressing needing MIN A to achieve figure four position     Lower Body Dressing: Minimal assistance;Sitting/lateral leans Lower Body Dressing Details (indicate cue type and reason): pt able to adjust sock from sitting in recliner needing MIN A to position RLE into figure four position Toilet Transfer: Minimal assistance;+2 for physical assistance;RW Toilet Transfer Details (indicate cue type and reason): simulated via functional mobility MIN A +2 to pivot to recliner with RW         Functional mobility during ADLs: Minimal assistance;Moderate assistance;Rolling walker General ADL Comments: pt continues to present with  impaired proprioception, decreased strength, numbness in RLE and decreased ability to care for self impacting pts ability to complete BADLs independently     Vision Baseline Vision/History: No visual  deficits Patient Visual Report: No change from baseline Vision Assessment?: No apparent visual deficits   Perception     Praxis      Cognition Arousal/Alertness: Awake/alert Behavior During Therapy: WFL for tasks assessed/performed Overall Cognitive Status: Within Functional Limits for tasks assessed                                          Exercises General Exercises - Lower Extremity Ankle Circles/Pumps: AROM;Both;10 reps;Supine Long Arc Quad: AROM;Both;15 reps;Seated Hip ABduction/ADduction: AROM;Both;15 reps;Seated Straight Leg Raises: AROM;Both;15 reps;Supine Hip Flexion/Marching: AROM;Both;15 reps;Seated Toe Raises: AROM;Both;15 reps;Seated Heel Raises: AROM;Both;15 reps;Seated   Shoulder Instructions       General Comments Wife present during session. Noted to have no sensation of RLE and impaired proprioception at right hallux.    Pertinent Vitals/ Pain       Pain Assessment: No/denies pain  Home Living                                          Prior Functioning/Environment              Frequency  Min 2X/week        Progress Toward Goals  OT Goals(current goals can now be found in the care plan section)  Progress towards OT goals: Progressing toward goals  Acute Rehab OT Goals Patient Stated Goal: to return to independent mobility OT Goal Formulation: With patient Time For Goal Achievement: 03/11/20 Potential to Achieve Goals: Good  Plan Discharge plan remains appropriate;Frequency remains appropriate    Co-evaluation    PT/OT/SLP Co-Evaluation/Treatment: Yes Reason for Co-Treatment: Complexity of the patient's impairments (multi-system involvement);For patient/therapist safety;To address functional/ADL transfers   OT goals addressed during session: ADL's and self-care      AM-PAC OT "6 Clicks" Daily Activity     Outcome Measure   Help from another person eating meals?: None Help from another person  taking care of personal grooming?: A Little Help from another person toileting, which includes using toliet, bedpan, or urinal?: A Lot Help from another person bathing (including washing, rinsing, drying)?: A Lot Help from another person to put on and taking off regular upper body clothing?: A Little Help from another person to put on and taking off regular lower body clothing?: A Lot 6 Click Score: 16    End of Session Equipment Utilized During Treatment: Gait belt;Rolling walker  OT Visit Diagnosis: Unsteadiness on feet (R26.81);Muscle weakness (generalized) (M62.81);Pain   Activity Tolerance Patient tolerated treatment well   Patient Left in chair;with call bell/phone within reach;with chair alarm set;with family/visitor present   Nurse Communication Mobility status        Time: 4944-9675 OT Time Calculation (min): 25 min  Charges: OT General Charges $OT Visit: 1 Visit OT Treatments $Therapeutic Activity: 8-22 mins  Audery Amel., COTA/L Acute Rehabilitation Services [phone]    Angelina Pih 02/28/2020, 2:36 PM

## 2020-02-28 NOTE — Progress Notes (Signed)
Inpatient Rehabilitation Medication Review by a Pharmacist  A complete drug regimen review was completed for this patient to identify any potential clinically significant medication issues.  Clinically significant medication issues were identified:  no  Check AMION for pharmacist assigned to patient if future medication questions/issues arise during this admission.  Pharmacist comments:   Time spent performing this drug regimen review (minutes):  5   Wania Longstreth 02/28/2020 8:20 PM

## 2020-02-28 NOTE — H&P (Incomplete)
Physical Medicine and Rehabilitation Admission H&P     HPI: Timothy Ware is an 85 year old right-handed male with history of CAD/PTCA maintained on aspirin.  Per chart review lives with spouse.  Independent with assistive device.  1 level home one-step to entry.  Family assistance as needed.  Presented 02/24/2020 with progressive weakness and back pain as well as numbness lower extremities right greater than left.  X-rays and imaging revealed thoracic disc herniation T10-T11 with thoracic myelopathy and canal stenosis.  Underwent transpedicular microdiscectomy thoracic 10-thoracic 11 02/25/2020 per Dr. Venetia Maxon.  Decadron taper as directed. No brace required. Admission chemistries showed a glucose 136 creatinine 1.34 and hemoglobin 12.1.  No postoperative labs have been ordered.  He is tolerating a regular diet.  Therapy evaluations completed and patient was admitted for a comprehensive rehab program.  Review of Systems  Constitutional: Negative for fever.  Eyes: Negative for blurred vision and double vision.  Respiratory: Negative for cough and shortness of breath.   Cardiovascular: Positive for leg swelling. Negative for chest pain and palpitations.  Gastrointestinal: Positive for constipation. Negative for heartburn and nausea.  Genitourinary: Positive for urgency. Negative for dysuria, flank pain and hematuria.  Musculoskeletal: Positive for back pain and myalgias.  Skin: Negative for rash.  Neurological: Positive for sensory change and weakness.  All other systems reviewed and are negative.  Past Medical History:  Diagnosis Date  . Anemia   . Back pain with radiculopathy   . Coronary artery disease   . DJD (degenerative joint disease)   . Hypertension   . PONV (postoperative nausea and vomiting)   . Vitamin D deficiency    Past Surgical History:  Procedure Laterality Date  . BACK SURGERY    . CARDIAC CATHETERIZATION  10/11/1996   NL LM, insignificant LAD, 75% pRCA, 95% dRCA, 75%  OM2, EF 72%. S/p RCA stents x2 & CX x1.  Marland Kitchen CARPAL TUNNEL RELEASE Bilateral   . CORONARY ANGIOPLASTY     RCA PTCA/stent 10/29/1994; RCA stents x2, Cx x1, [facility] 10/11/1996  . ELBOW SURGERY Left   . EYE SURGERY Bilateral    cataract  . ROTATOR CUFF REPAIR Left   . THORACIC DISCECTOMY N/A 02/25/2020   Procedure: TRANSPEDICULAR MICRODISCECTOMY THORACIC TEN THORACIC ELEVEN;  Surgeon: Maeola Harman, MD;  Location: [facility] OR;  Service: Neurosurgery;  Laterality: N/A;  posterior   History reviewed. No pertinent family history. Social History:  reports that he has never smoked. He has never used smokeless tobacco. He reports that he does not drink alcohol and does not use drugs. Allergies:  Allergies  Allergen Reactions  . Penicillins Hives   Medications Prior to Admission  Medication Sig Dispense Refill  . aspirin EC 81 MG tablet Take 81 mg by mouth daily. Swallow whole.    Marland Kitchen atenolol (TENORMIN) 25 MG tablet Take 25 mg by mouth daily.    Marland Kitchen atorvastatin (LIPITOR) 40 MG tablet Take 20 mg by mouth daily.    . B Complex-C (B-COMPLEX WITH VITAMIN C) tablet Take 1 tablet by mouth daily.    . benazepril (LOTENSIN) 10 MG tablet Take 10 mg by mouth daily.    . Cholecalciferol (VITAMIN D) 50 MCG (2000 UT) tablet Take 2,000 Units by mouth daily.      Drug Regimen Review Drug regimen was reviewed and remains appropriate with no significant issues identified  Home: Home Living Family/patient expects to be discharged to:: Private residence Living Arrangements: Spouse/significant other Available Help at Discharge: Family, Available 24 hours/day  Type of Home: House Home Access: Stairs to enter [employer] of Steps: 1 Entrance Stairs-Rails: None Home Layout: Two level, Able to live on main level with bedroom/bathroom Alternate Level Stairs-Rails:  (pt does not go downstairs) Bathroom Shower/Tub: Engineer, manufacturing systems: Handicapped height Bathroom Accessibility: Yes Home Equipment: Cane  - single point, Environmental consultant - 2 wheels, Environmental consultant - standard, Information systems manager, [employer] - quad   Functional History: Prior Function Level of Independence: Independent with assistive device(s) Comments: SPC occasionally  Functional Status:  Mobility: Bed Mobility Overal bed mobility: Needs Assistance Bed Mobility: Supine to Sit Rolling: Min guard Sidelying to sit: Min assist Supine to sit: Supervision, HOB elevated Sit to supine: Mod assist General bed mobility comments: PT provides cues for log roll however pt does not follow these cues and performs supine to sit Transfers Overall transfer level: Needs assistance Equipment used: Rolling walker (2 wheeled) Transfers: Stand Pivot Transfers, Sit to/from Stand Sit to Stand: Mod assist Stand pivot transfers: Max assist General transfer comment: pt with hypextension of R knee during SPT, R knee block provided to prevent buckling Ambulation/Gait Ambulation/Gait assistance:  (deferred due to R knee buckling)    ADL: ADL Overall ADL's : Needs assistance/impaired Eating/Feeding: Set up Grooming: Set up, Sitting Upper Body Bathing: Minimal assistance, Sitting Lower Body Bathing: Maximal assistance, Sitting/lateral leans, Sit to/from stand Upper Body Dressing : Minimal assistance, Sitting Lower Body Dressing: Maximal assistance, Sitting/lateral leans, Sit to/from stand Toilet Transfer: Moderate assistance, +2 for physical assistance, +2 for safety/equipment, Squat-pivot, RW Toilet Transfer Details (indicate cue type and reason): pt able to shuffle steps R to left and up towards [facility] Toileting- Clothing Manipulation and Hygiene: Maximal assistance, Sitting/lateral lean, Sit to/from stand Functional mobility during ADLs: Moderate assistance, Rolling walker (taking a few steps to [facility]; BLEs leaning on bed) General ADL Comments: PT limited by decreased strength, numbness in LLE, decreased ability to care for self and decreased strength.   Cognition:  Cognition Overall Cognitive Status: Within Functional Limits for tasks assessed Orientation Level: (P) Oriented X4 Cognition Arousal/Alertness: Awake/alert Behavior During Therapy: WFL for tasks assessed/performed Overall Cognitive Status: Within Functional Limits for tasks assessed General Comments: pt recalls back precautions, does not recall log roll technique  Physical Exam: Blood pressure (!) 155/74, pulse 77, temperature (!) 97.3 F (36.3 C), temperature source Oral, resp. rate 20, height 6\' 1"  (1.854 m), weight 87.1 kg, SpO2 99 %. Physical Exam Skin:    Comments: Back incision is dressed  Neurological:     Comments: Patient is alert in no acute distress oriented x3 and follows commands.     No results found for this or any previous visit (from the past 48 hour(s)). No results found.     Medical Problem List and Plan: 1.  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  Status post transpedicular microdiscectomy T10-T11 02/25/2020.  Decadron taper.No brace required  -patient may *** shower  -ELOS/Goals: *** 2.  Antithrombotics: -DVT/anticoagulation: SCDs.  Check vascular study  -antiplatelet therapy: Aspirin 81 mg daily 3. Pain Management: Hydrocodone and Robaxin as needed 4. Mood: Provide emotional support  -antipsychotic agents: N/A 5. Neuropsych: This patient is capable of making decisions on his own behalf. 6. Skin/Wound Care: Routine skin checks 7. Fluids/Electrolytes/Nutrition: Routine in and outs with follow-up chemistries 8.  Hypertension.  Tenormin 25 mg daily, Lotensin 10 mg daily.  Monitor with increased mobility 9.  CAD with PTCA.  Aspirin 81 mg daily. 10.  Hyperlipidemia.  Lipitor   ***  Mcarthur Rossetti Elaura Calix, PA-C 02/28/2020

## 2020-02-28 NOTE — TOC Transition Note (Signed)
Transition of Care [facility]) - CM/SW Discharge Note   Patient Details  Name: Timothy Ware MRN: [id number] Date of Birth: Aug 11, 1934  [facility] of [facility]) CM/SW Contact:  Kermit Balo, RN Phone Number: 02/28/2020, 12:49 PM   Clinical Narrative:    Pt is discharging to CIR today. CM signing off.    Final next level of care: IP Rehab Facility Barriers to Discharge: No Barriers Identified   Patient Goals and CMS Choice        Discharge Placement                       Discharge Plan and Services                                     Social Determinants of Health (SDOH) Interventions     Readmission Risk Interventions No flowsheet data found.

## 2020-02-28 NOTE — Progress Notes (Signed)
Discharged to 4W rm 7 after report given to [facility] and IV access removed.

## 2020-02-28 NOTE — Progress Notes (Signed)
Inpatient Rehab Admissions Coordinator:  There is a bed available for pt to admit to CIR today.  Benita Gutter, NP made aware and is in agreement.  NSG and TOC made aware. Called to notify pt's family; awaiting return call.  Wolfgang Phoenix, MS, CCC-SLP Admissions Coordinator [phone]

## 2020-02-28 NOTE — Progress Notes (Signed)
Signed       PMR Admission Coordinator Pre-Admission Assessment   Patient: Timothy Ware is an 85 y.o., male MRN: [id number] DOB: 29-Sep-1934 Height: _0  (185.4 cm) Weight: 87.1 kg   Insurance Information HMO:     PPO:      PCP:      IPA:      80/20: yes     OTHER:  PRIMARY: Medicare A & B      Policy#: [id number]      Subscriber: patient CM Name:       Phone#:      Fax#:  Pre-Cert#:       Employer:  Benefits:  Phone #:verified online via [employer] on 02/27/20      Name:  Eff. Date: Part A & B effective 05/23/01     Deduct: $1,484      Out of Pocket Max: NA      Life Max: NA CIR: 100% with Medicare approval      SNF: 100% (days 1-20), 80% (days 21-100) Outpatient: 80%     Co-Pay: 20% Home Health: 100%      Co-Pay:  DME: 80%     Co-Pay: 20% Providers: pt's choice SECONDARY: [employer]      Policy#: [id number]     Phone#: [phone]   Financial Counselor:       Phone#:    The "Data Collection Information Summary" for patients in Inpatient Rehabilitation Facilities with attached "Privacy Act [place] Records" was provided and verbally reviewed with: Patient and Family   Emergency Contact Information         Contact Information     Name Relation Home Work [place] Spouse Warren Daughter [phone]             Current Medical History  Patient Admitting Diagnosis: 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 Pt admitted to hospital on 02/25/20 for planned transpedicular microdisectomy T10/11 with posterior nerve monitoring and microdissection.  After surgery, noted that pt had increased numbness in his right lower extremity.  Numbness slowly improved, but continues to be persistent and cause ambulation to be difficult. Therapy evaluations completed and CIR  recommended d/t functional decline.   Patient's medical record from [facility] has been reviewed by the rehabilitation admission coordinator and physician.   Past Medical History      Past Medical History:  Diagnosis Date  . Anemia    . Back pain with radiculopathy    . Coronary artery disease    . DJD (degenerative joint disease)    . Hypertension    . PONV (postoperative nausea and vomiting)    . Vitamin D deficiency        Family History   family history is not on file.   Prior Rehab/Hospitalizations Has the patient had prior rehab or hospitalizations prior to admission? No   Has the patient had major surgery during 100 days prior to admission? Yes              Current Medications   Current Facility-Administered Medications:  .  0.9 %  sodium chloride infusion, 250 mL, Intravenous, Continuous, Erline Levine, MD, Stopped at 02/25/20 1952 .  acetaminophen (TYLENOL) tablet 650 mg, 650 mg, Oral, Q4H PRN **OR** acetaminophen (TYLENOL) suppository 650 mg, 650 mg, Rectal,  Q4H PRN, Erline Levine, MD .  alum & mag hydroxide-simeth (MAALOX/MYLANTA) 200-200-20 MG/5ML suspension 30 mL, 30 mL, Oral, Q6H PRN, Erline Levine, MD .  aspirin EC tablet 81 mg, 81 mg, Oral, Daily, Erline Levine, MD, 81 mg at 02/28/20 0931 .  atenolol (TENORMIN) tablet 25 mg, 25 mg, Oral, Daily, Erline Levine, MD, 25 mg at 02/28/20 0931 .  atorvastatin (LIPITOR) tablet 20 mg, 20 mg, Oral, Daily, Erline Levine, MD, 20 mg at 02/28/20 0931 .  B-complex with vitamin C tablet 1 tablet, 1 tablet, Oral, Daily, Erline Levine, MD, 1 tablet at 02/28/20 0931 .  benazepril (LOTENSIN) tablet 10 mg, 10 mg, Oral, Daily, Erline Levine, MD, 10 mg at 02/28/20 0931 .  bisacodyl (DULCOLAX) suppository 10 mg, 10 mg, Rectal, Daily PRN, Erline Levine, MD .  cholecalciferol (VITAMIN D3) tablet 2,000 Units, 2,000 Units, Oral, Daily, Erline Levine, MD, 2,000 Units at 02/28/20 0931 .  dexamethasone (DECADRON) injection 4 mg, 4 mg,  Intravenous, Q8H, Erline Levine, MD, 4 mg at 02/28/20 [phone] .  dextrose 5 % and 0.45 % NaCl with KCl 20 mEq/L infusion, , Intravenous, Continuous, Erline Levine, MD, Last Rate: 75 mL/hr at 02/28/20 1035, New Bag at 02/28/20 1035 .  docusate sodium (COLACE) capsule 100 mg, 100 mg, Oral, BID, Erline Levine, MD, 100 mg at 02/28/20 0931 .  HYDROcodone-acetaminophen (NORCO/VICODIN) 5-325 MG per tablet 2 tablet, 2 tablet, Oral, Q4H PRN, Erline Levine, MD .  HYDROmorphone (DILAUDID) injection 0.5 mg, 0.5 mg, Intravenous, Q2H PRN, Erline Levine, MD .  menthol-cetylpyridinium (CEPACOL) lozenge 3 mg, 1 lozenge, Oral, PRN **OR** phenol (CHLORASEPTIC) mouth spray 1 spray, 1 spray, Mouth/Throat, PRN, Erline Levine, MD .  methocarbamol (ROBAXIN) tablet 500 mg, 500 mg, Oral, Q6H PRN, 500 mg at 02/26/20 1001 **OR** methocarbamol (ROBAXIN) 500 mg in dextrose 5 % 50 mL IVPB, 500 mg, Intravenous, Q6H PRN, Erline Levine, MD .  ondansetron [facility]) tablet 4 mg, 4 mg, Oral, Q6H PRN **OR** ondansetron (ZOFRAN) injection 4 mg, 4 mg, Intravenous, Q6H PRN, Erline Levine, MD .  oxyCODONE (Oxy IR/ROXICODONE) immediate release tablet 5 mg, 5 mg, Oral, Q3H PRN, Erline Levine, MD, 5 mg at 02/26/20 1001 .  pantoprazole (PROTONIX) EC tablet 40 mg, 40 mg, Oral, QHS, [name], Dwayne A, RPH, 40 mg at 02/27/20 2126 .  polyethylene glycol (MIRALAX / GLYCOLAX) packet 17 g, 17 g, Oral, Daily PRN, Erline Levine, MD .  sodium chloride flush (NS) 0.9 % injection 3 mL, 3 mL, Intravenous, Q12H, Erline Levine, MD, 3 mL at 02/28/20 0932 .  sodium chloride flush (NS) 0.9 % injection 3 mL, 3 mL, Intravenous, PRN, Erline Levine, MD .  sodium phosphate (FLEET) 7-19 GM/118ML enema 1 enema, 1 enema, Rectal, Once PRN, Erline Levine, MD .  zolpidem [facility]) tablet 5 mg, 5 mg, Oral, QHS PRN, Erline Levine, MD   Patients Current Diet:  Diet Order                      Diet regular Room service appropriate? Yes; Fluid consistency: Thin  Diet effective now                       Precautions / Restrictions Precautions Precautions: Back, Fall Precaution Booklet Issued: Yes (comment) Precaution Comments: Reviewed log roll technique Restrictions Weight Bearing Restrictions: No    Has the patient had 2 or more falls or a fall with injury in the past year? No   Prior Activity Level Community (  5-7x/wk): drives. typically gets out of house daily. However the last few weeks rarely got out of house   Prior Functional Level Self Care: Did the patient need help bathing, dressing, using the toilet or eating? Independent   Indoor Mobility: Did the patient need assistance with walking from room to room (with or without device)? Independent   Stairs: Did the patient need assistance with internal or external stairs (with or without device)? Pt reported not going up/down steps   Functional Cognition: Did the patient need help planning regular tasks such as shopping or remembering to take medications? [place] Devices/Equipment: Radio producer (specify quad or straight) Home Equipment: Cane - single point, Environmental consultant - 2 wheels, Environmental consultant - standard, Civil engineer, contracting, [employer] - quad   Prior Device Use: Indicate devices/aids used by the patient prior to current illness, exacerbation or injury? cane for past 3-4 months   Current Functional Level Cognition   Overall Cognitive Status: Within Functional Limits for tasks assessed Orientation Level: Oriented X4 General Comments: pt recalls back precautions, does not recall log roll technique    Extremity Assessment (includes Sensation/Coordination)   Upper Extremity Assessment: Defer to OT evaluation RUE Deficits / Details: RUE pain with shoulder flex to 90* previous injury  Lower Extremity Assessment: RLE deficits/detail, LLE deficits/detail RLE Deficits / Details: numb in entire RLE, no sensation to light touch or pressure. Strength WFL LLE Deficits / Details: strength  WFL, numb but remains able to feel light touch     ADLs   Overall ADL's : Needs assistance/impaired Eating/Feeding: Set up Grooming: Set up, Sitting Upper Body Bathing: Minimal assistance, Sitting Lower Body Bathing: Maximal assistance, Sitting/lateral leans, Sit to/from stand Upper Body Dressing : Minimal assistance, Sitting Lower Body Dressing: Maximal assistance, Sitting/lateral leans, Sit to/from stand Toilet Transfer: Moderate assistance, +2 for physical assistance, +2 for safety/equipment, Squat-pivot, RW Toilet Transfer Details (indicate cue type and reason): pt able to shuffle steps R to left and up towards [place] and Hygiene: Maximal assistance, Sitting/lateral lean, Sit to/from stand Functional mobility during ADLs: Moderate assistance, Rolling walker (taking a few steps to [facility]; BLEs leaning on bed) General ADL Comments: PT limited by decreased strength, numbness in LLE, decreased ability to care for self and decreased strength.      Mobility   Overal bed mobility: Needs Assistance Bed Mobility: Rolling, Sidelying to Sit Rolling: Min guard Sidelying to sit: Min guard, HOB elevated Supine to sit: Supervision, HOB elevated Sit to supine: Mod assist General bed mobility comments: Step by step cues for log roll technique; use of rail to get to EOB. Increased time/effort but no assist needed.     Transfers   Overall transfer level: Needs assistance Equipment used: Rolling walker (2 wheeled) Transfers: Sit to/from Stand, Stand Pivot Transfers Sit to Stand: Min assist, From elevated surface Stand pivot transfers: Min assist, +2 safety/equipment, +2 physical assistance General transfer comment: Assist to power to standing with cues for hand placement/technique. Cues for upright posture and hip/knee extension and to widen boS. Stood from [employer], from chair x1. SPT bed to chair with assist for RW management, weight shifting and foot placement. posterior  lean through hips esp when fatigued.     Ambulation / Gait / Stairs / Wheelchair Mobility   Ambulation/Gait Ambulation/Gait assistance: Min assist, +2 safety/equipment Gait Distance (Feet): 7 Feet Assistive device: Rolling walker (2 wheeled) Gait Pattern/deviations: Step-through pattern, Narrow base of support, Trunk flexed, Decreased step  length - left General Gait Details: Slow, unsteady gait withi difficulty advancing RLE requiring assist for weight shifting, cues to widen BoS and for upright posture. Posterior lean through hips when fatigued. no knee buckling noted. Gait velocity: decreased     Posture / Balance Balance Overall balance assessment: Needs assistance Sitting-balance support: Feet supported, No upper extremity supported Sitting balance-Leahy Scale: Good Standing balance support: During functional activity Standing balance-Leahy Scale: Poor Standing balance comment: Requires UE support and Min guard-Min A     Special needs/care consideration Continuous Drip IV  dextrose 5% and 0.45% NaCl with KCl 120 mEq/L infusion: 75 mL/hr; 0.9% sodium chloride infusion 250 mL: 82m/hr, Skin surgical incision: back and Designated visitor LCalob Ware wife    Previous Home Environment (from acute therapy documentation) Living Arrangements: Spouse/significant other Available Help at Discharge: Family, Available 24 hours/day Type of Home: House Home Layout: Two level, Able to live on main level with bedroom/bathroom Alternate Level Stairs-Rails:  (pt does not go downstairs) Home Access: Stairs to enter Entrance Stairs-Rails: None Entrance Stairs-Number of Steps: 1 Bathroom Shower/Tub: TChiropodist Handicapped height Bathroom Accessibility: Yes How Accessible: Accessible via walker [place] No   Discharge Living Setting Plans for Discharge Living Setting: Patient's home Type of Home at Discharge: House Discharge Home Layout: Two level, Able to live  on main level with bedroom/bathroom Alternate Level Stairs-Rails:  (pt does not go downstairs) Discharge Home Access: Stairs to enter Entrance Stairs-Rails: None Entrance Stairs-Number of Steps: 1 Discharge Bathroom Shower/Tub: Tub/shower unit Discharge Bathroom Toilet: Handicapped height Discharge Bathroom Accessibility: Yes How Accessible: Accessible via walker Does the patient have any problems obtaining your medications?: No   Social/Family/Support Systems Patient Roles: Spouse Anticipated Caregiver: Timothy Ware wife and children Anticipated Caregiver's Contact Information: [phone] Availability: 24/7 (planning on quitting job ) Discharge Plan Discussed with Primary Caregiver: Yes Is Caregiver In Agreement with Plan?: Yes Does Caregiver/Family have Issues with Lodging/Transportation while Pt is in Rehab?: No   Goals Patient/Family Goal for Rehab: Mod I: PT/OT Expected length of stay: 14-18 days Pt/Family Agrees to Admission and willing to participate: Yes Program Orientation Provided & Reviewed with Pt/Caregiver Including Roles  & Responsibilities: Yes   Decrease burden of Care through IP rehab admission: NA   Possible need for SNF placement upon discharge: NA   Patient Condition: I have reviewed [facility] spoken with CM, and patient and spouse. I met with patient at the bedside for inpatient rehabilitation assessment.  Patient will benefit from ongoing PT and OT, can actively participate in 3 hours of therapy a day 5 days of the week, and can make measurable gains during the admission.  Patient will also benefit from the coordinated team approach during an Inpatient Acute Rehabilitation admission.  The patient will receive intensive therapy as well as Rehabilitation physician, nursing, social worker, and care management interventions.  Due to safety, skin/wound care, disease management, medication administration, pain management and  patient education the patient requires 24 hour a day rehabilitation nursing.  The patient is currently Min A+2-Min G with mobility and Min A-Max A with basic ADLs.  Discharge setting and therapy post discharge at home with home health is anticipated.  Patient has agreed to participate in the Acute Inpatient Rehabilitation Program and will admit today.   Preadmission Screen Completed By:  LBethel Born 02/28/2020 1:36 PM ______________________________________________________________________   Discussed status with Dr. KLetta Pateon 02/28/20  at 1:36 PM and received  approval for admission today.   Admission Coordinator:  Bethel Born, CCC-SLP, time 1:36 PM/Date 02/28/20     Assessment/Plan: Diagnosis:  Thoracic myelopathy 1. Does the need for close, 24 hr/day Medical supervision in concert with the patient's rehab needs make it unreasonable for this patient to be served in a less intensive setting? Yes 2. Co-Morbidities requiring supervision/potential complications: CAD, HTN 3. Due to bladder management, bowel management, safety, skin/wound care, disease management, medication administration, pain management and patient education, does the patient require 24 hr/day rehab nursing? Yes 4. Does the patient require coordinated care of a physician, rehab nurse, PT, OT, and SLP to address physical and functional deficits in the context of the above medical diagnosis(es)? Yes Addressing deficits in the following areas: balance, endurance, locomotion, strength, transferring, bowel/bladder control, bathing, dressing, grooming, toileting and psychosocial support 5. Can the patient actively participate in an intensive therapy program of at least 3 hrs of therapy 5 days a week? Yes 6. The potential for patient to make measurable gains while on inpatient rehab is excellent 7. Anticipated functional outcomes upon discharge from inpatient rehab: modified independent PT, modified independent OT,  n/a SLP 8. Estimated rehab length of stay to reach the above functional goals is: 14-18d 9. Anticipated discharge destination: Home 10. Overall Rehab/Functional Prognosis: excellent     MD Signature: Charlett Blake M.D. [facility] FAAPM&R (Neuromuscular Med) Diplomate Am Board of Electrodiagnostic Med Fellow Am Board of Interventional Pain            Revision History

## 2020-02-29 ENCOUNTER — Inpatient Hospital Stay (HOSPITAL_COMMUNITY): Payer: Medicare Other

## 2020-02-29 ENCOUNTER — Inpatient Hospital Stay (HOSPITAL_COMMUNITY): Payer: Medicare Other | Admitting: Occupational Therapy

## 2020-02-29 DIAGNOSIS — M4714 Other spondylosis with myelopathy, thoracic region: Secondary | ICD-10-CM | POA: Diagnosis not present

## 2020-02-29 DIAGNOSIS — G8918 Other acute postprocedural pain: Secondary | ICD-10-CM

## 2020-02-29 DIAGNOSIS — E46 Unspecified protein-calorie malnutrition: Secondary | ICD-10-CM

## 2020-02-29 DIAGNOSIS — E8809 Other disorders of plasma-protein metabolism, not elsewhere classified: Secondary | ICD-10-CM

## 2020-02-29 DIAGNOSIS — R609 Edema, unspecified: Secondary | ICD-10-CM

## 2020-02-29 DIAGNOSIS — I10 Essential (primary) hypertension: Secondary | ICD-10-CM

## 2020-02-29 DIAGNOSIS — M5104 Intervertebral disc disorders with myelopathy, thoracic region: Secondary | ICD-10-CM | POA: Diagnosis not present

## 2020-02-29 DIAGNOSIS — M7989 Other specified soft tissue disorders: Secondary | ICD-10-CM

## 2020-02-29 LAB — COMPREHENSIVE METABOLIC PANEL
ALT: 36 U/L (ref 0–44)
AST: 28 U/L (ref 15–41)
Albumin: 3.2 g/dL — ABNORMAL LOW (ref 3.5–5.0)
Alkaline Phosphatase: 66 U/L (ref 38–126)
Anion gap: 13 (ref 5–15)
BUN: 21 mg/dL (ref 8–23)
CO2: 22 mmol/L (ref 22–32)
Calcium: 8.8 mg/dL — ABNORMAL LOW (ref 8.9–10.3)
Chloride: 101 mmol/L (ref 98–111)
Creatinine, Ser: 1.15 mg/dL (ref 0.61–1.24)
GFR, Estimated: >60 mL/min (ref >60)
Glucose, Bld: 130 mg/dL — ABNORMAL HIGH (ref 70–99)
Potassium: 4.5 mmol/L (ref 3.5–5.1)
Sodium: 136 mmol/L (ref 135–145)
Total Bilirubin: 0.7 mg/dL (ref 0.3–1.2)
Total Protein: 5.5 g/dL — ABNORMAL LOW (ref 6.5–8.1)

## 2020-02-29 LAB — CBC WITH DIFFERENTIAL/PLATELET
Abs Immature Granulocytes: 0.05 10*3/uL (ref 0.00–0.07)
Basophils Absolute: 0 10*3/uL (ref 0.0–0.1)
Basophils Relative: 0 %
Eosinophils Absolute: 0 10*3/uL (ref 0.0–0.5)
Eosinophils Relative: 0 %
HCT: 39.4 % (ref 39.0–52.0)
Hemoglobin: 13.5 g/dL (ref 13.0–17.0)
Immature Granulocytes: 1 %
Lymphocytes Relative: 14 %
Lymphs Abs: 1.3 10*3/uL (ref 0.7–4.0)
MCH: 31.8 pg (ref 26.0–34.0)
MCHC: 34.3 g/dL (ref 30.0–36.0)
MCV: 92.9 fL (ref 80.0–100.0)
Monocytes Absolute: 0.7 10*3/uL (ref 0.1–1.0)
Monocytes Relative: 8 %
Neutro Abs: 7.2 10*3/uL (ref 1.7–7.7)
Neutrophils Relative %: 77 %
Platelets: 151 10*3/uL (ref 150–400)
RBC: 4.24 MIL/uL (ref 4.22–5.81)
RDW: 12.6 % (ref 11.5–15.5)
WBC: 9.3 10*3/uL (ref 4.0–10.5)
nRBC: 0 % (ref 0.0–0.2)

## 2020-02-29 MED ORDER — PROSOURCE PLUS PO LIQD
30.0000 mL | Freq: Two times a day (BID) | ORAL | Status: DC
  Administered 2020-02-29 – 2020-03-21 (×42): 30 mL via ORAL
  Filled 2020-02-29 (×38): qty 30

## 2020-02-29 NOTE — Progress Notes (Signed)
Inpatient Rehabilitation  Patient information reviewed and entered into eRehab system by Sneijder Bernards M. Leevi Cullars, M.A., CCC/SLP, PPS Coordinator.  Information including medical coding, functional ability and quality indicators will be reviewed and updated through discharge.    

## 2020-02-29 NOTE — Progress Notes (Signed)
[facility] PHYSICAL MEDICINE & REHABILITATION PROGRESS NOTE  Subjective/Complaints: Patient seen laying in bed this morning.  He states he slept well overnight.  He states he is ready begin therapies.  ROS: Denies CP, SOB, N/V/D  Objective: Vital Signs: Blood pressure 140/70, pulse 60, temperature 98.3 F (36.8 C), temperature source Oral, resp. rate 18, height 6\' 1"  (1.854 m), weight 88.1 kg, SpO2 98 %. No results found. Recent Labs    02/29/20 0456  WBC 9.3  HGB 13.5  HCT 39.4  PLT 151   Recent Labs    02/29/20 0456  NA 136  K 4.5  CL 101  CO2 22  GLUCOSE 130*  BUN 21  CREATININE 1.15  CALCIUM 8.8*    Intake/Output Summary (Last 24 hours) at 02/29/2020 0857 Last data filed at 02/29/2020 0514 Gross per 24 hour  Intake 120 ml  Output 1700 ml  Net -1580 ml        Physical Exam: BP 140/70   Pulse 60   Temp 98.3 F (36.8 C) (Oral)   Resp 18   Ht 6\' 1"  (1.854 m)   Wt 88.1 kg   SpO2 98%   BMI 25.62 kg/m  Constitutional: No distress . Vital signs reviewed. HENT: Normocephalic.  Atraumatic. Eyes: EOMI. No discharge. Cardiovascular: No JVD.  RRR. Respiratory: Normal effort.  No stridor.  Bilateral clear to auscultation. GI: Non-distended.  BS +. Skin: Warm and dry.    Back incision with dressing CDI Psych: Normal mood.  Normal behavior. Musc: No edema in extremities.  No tenderness in extremities. Neurologic: Alert Bilateral upper extremities: 5/5 proximal distal Left lower extremity: Hip flexion, knee extension 4 --4/5, ankle dorsiflexion 5/5 Right lower extremity: Hip flexion, knee extension four medicine/5, ankle dorsiflexion 5/5  Sensation diminished to light touch right lower extremity  Assessment/Plan: 1. Functional deficits which require 3+ hours per day of interdisciplinary therapy in a comprehensive inpatient rehab setting.  Physiatrist is providing close team supervision and 24 hour management of active medical problems listed  below.  Physiatrist and rehab team continue to assess barriers to discharge/monitor patient progress toward functional and medical goals   Care Tool:  Bathing              Bathing assist       Upper Body Dressing/Undressing Upper body dressing        Upper body assist      Lower Body Dressing/Undressing Lower body dressing            Lower body assist       Toileting Toileting    Toileting assist Assist for toileting: Minimal Assistance - Patient > 75%     Transfers Chair/bed transfer  Transfers assist     Chair/bed transfer assist level: 2 Helpers     Locomotion Ambulation   Ambulation assist              Walk 10 feet activity   Assist           Walk 50 feet activity   Assist           Walk 150 feet activity   Assist           Walk 10 feet on uneven surface  activity   Assist           Wheelchair     Assist               Wheelchair 50 feet with 2 turns activity  Assist            Wheelchair 150 feet activity     Assist           Medical Problem List and Plan: 1.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 Status post transpedicular microdiscectomy T10-T11 02/25/2020. Decadron taper. No brace required  Begin CIR evaluations 2. Antithrombotics: -DVT/anticoagulation:SCDs.   Vascular studies pending -antiplatelet therapy: Aspirin 81 mg daily 3.  Postoperative pain:Hydrocodone and Robaxin as needed  Monitor with increased exertion 4. Mood:Provide emotional support -antipsychotic agents: N/A 5. Neuropsych: This patientiscapable of making decisions on hisown behalf. 6. Skin/Wound Care:Routine skin checks 7. Fluids/Electrolytes/Nutrition:Routine in and outs. 8. Hypertension. Tenormin 25 mg daily, Lotensin 10 mg daily.   Monitor the increased mobility 9. CAD with PTCA. Aspirin 81 mg daily. 10. Hyperlipidemia.  Lipitor 11.  Hypoalbuminemia  Supplement initiated    LOS: 1 days A FACE TO FACE EVALUATION WAS PERFORMED  Tegh Franek [name] 02/29/2020, 8:57 AM

## 2020-02-29 NOTE — Evaluation (Signed)
Occupational Therapy Assessment and Plan  Patient Details  Name: Timothy Ware MRN: [id number] Date of Birth: June 04, 1934  OT Diagnosis: abnormal posture, acute pain, apraxia, lumbago (low back pain) and muscle weakness (generalized) Rehab Potential: Rehab Potential (ACUTE ONLY): Good ELOS: 2 weeks   Today's Date: 02/29/2020 OT Individual Time: 1105-1200 ; and 1445-1530 OT Individual Time Calculation (min): 55 min; and 45 min      Hospital Problem: Principal Problem:   HNP (herniated nucleus pulposus with myelopathy), thoracic Active Problems:   Thoracic myelopathy   Hypoalbuminemia due to protein-calorie malnutrition (HCC)   Essential hypertension   Postoperative pain   Past Medical History:  Past Medical History:  Diagnosis Date  . Anemia   . Back pain with radiculopathy   . Coronary artery disease   . DJD (degenerative joint disease)   . Hypertension   . PONV (postoperative nausea and vomiting)   . Vitamin D deficiency    Past Surgical History:  Past Surgical History:  Procedure Laterality Date  . BACK SURGERY    . CARDIAC CATHETERIZATION  10/11/1996   NL LM, insignificant LAD, 75% pRCA, 95% dRCA, 75% OM2, EF 72%. S/p RCA stents x2 & CX x1.  Marland Kitchen CARPAL TUNNEL RELEASE Bilateral   . CORONARY ANGIOPLASTY     RCA PTCA/stent 10/29/1994; RCA stents x2, Cx x1, [facility] 10/11/1996  . ELBOW SURGERY Left   . EYE SURGERY Bilateral    cataract  . ROTATOR CUFF REPAIR Left   . THORACIC DISCECTOMY N/A 02/25/2020   Procedure: TRANSPEDICULAR MICRODISCECTOMY THORACIC TEN THORACIC ELEVEN;  Surgeon: Erline Levine, MD;  Location: [facility];  Service: Neurosurgery;  Laterality: N/A;  posterior    Assessment & Plan Clinical Impression: Timothy Ware is an 85 year old right-handed male with history of CAD/PTCA maintained on aspirin. Per chart review lives with spouse. Independent with assistive device. 1 level home one-step to entry. Family assistance as needed. Presented 02/24/2020 with progressive  weakness and back pain as well as numbness lower extremities right greater than left. X-rays and imaging revealed thoracic disc herniation T10-T11 with thoracic myelopathy and canal stenosis. Underwent transpedicular microdiscectomy thoracic 10-thoracic 11 02/25/2020 per Dr. Vertell Limber. Decadron taper as directed. No brace required.Admission chemistries showed a glucose 136 creatinine 1.34 and hemoglobin 12.1. No postoperative labs have been ordered.  Patient transferred to CIR on 02/28/2020 .    Patient currently requires mod with basic self-care skills secondary to muscle weakness and muscle paralysis, decreased cardiorespiratoy endurance, motor apraxia, decreased coordination and decreased motor planning and decreased sitting balance, decreased standing balance and decreased balance strategies.  Prior to hospitalization, patient could complete BADLs with independent .  Patient will benefit from skilled intervention to increase independence with basic self-care skills prior to discharge home with care partner.  Anticipate patient will require 24 hour supervision and follow up home health.  OT - End of Session Activity Tolerance: Tolerates 30+ min activity with multiple rests Endurance Deficit: Yes Endurance Deficit Description: Pain limiting as well as general deconditioning. Rest breaks required for recovery during functional mobility OT Assessment Rehab Potential (ACUTE ONLY): Good OT Patient demonstrates impairments in the following area(s): Balance;Sensory;Endurance;Motor OT Basic ADL's Functional Problem(s): Grooming;Bathing;Dressing;Toileting OT Transfers Functional Problem(s): Toilet;Tub/Shower OT Additional Impairment(s): None OT Plan OT Intensity: Minimum of 1-2 x/day, 45 to 90 minutes OT Frequency: 5 out of 7 days OT Duration/Estimated Length of Stay: 2 weeks OT Treatment/Interventions: Balance/vestibular training;Discharge planning;Self Care/advanced ADL retraining;Therapeutic  Activities;UE/LE Coordination activities;Pain management;Functional mobility training;Disease mangement/prevention;Patient/family education;Therapeutic Exercise;UE/LE Strength taining/ROM;Psychosocial support;Neuromuscular  re-education;DME/adaptive equipment instruction;Wheelchair propulsion/positioning OT Basic Self-Care Anticipated Outcome(s): supervision OT Toileting Anticipated Outcome(s): supervision OT Bathroom Transfers Anticipated Outcome(s): supervision OT Recommendation Patient destination: Home Follow Up Recommendations: 24 hour supervision/assistance Equipment Recommended: To be determined   OT Evaluation Precautions/Restrictions    Fall precautions; BLE weakness General  Chart reviewed: yes Pain  Pain 0-10 scale: 8/10 acute surgical in low back with standing; no pain at rest; second site: right shoulder with elevation from injury 4 months prior per pt. Home Living/Prior Functioning Home Living Family/patient expects to be discharged to:: Private residence Living Arrangements: Spouse/significant other Available Help at Discharge: Family, Available 24 hours/day, Other (Comment) (has 3 children able to assist as needed) Type of Home: House Home Access: Stairs to enter [employer] of Steps: 1 Entrance Stairs-Rails: Right, Left Home Layout: Two level, Able to live on main level with bedroom/bathroom, Other (Comment), Full bath on main level, Laundry or work area in basement Alternate Therapist, sports of Steps: basement has Medical sales representative- wife completes Bathroom Shower/Tub: Chiropodist: Handicapped height Bathroom Accessibility: Yes  Lives With: Spouse IADL History Homemaking Responsibilities: No Occupation: Retired Prior Function Level of Independence: Independent with gait, Independent with transfers, Other (comment), Independent with basic ADLs (occasional use of cane when walking dog per pt)  Able to Take Stairs?: Yes Driving:  Yes 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 Baseline Vision/History: Wears glasses Wears Glasses: Reading only Patient Visual Report: No change from baseline Vision Assessment?: No apparent visual deficits Perception  Perception: Within Functional Limits Praxis Praxis: Impaired Praxis Impairment Details: Motor planning Praxis-Other Comments: RLE Cognition Overall Cognitive Status: Within Functional Limits for tasks assessed Arousal/Alertness: Awake/alert Orientation Level: Person;Place;Situation Person: Oriented Place: Oriented Situation: Oriented Year: 2021 Month: December Day of Week: Correct Memory: Appears intact Immediate Memory Recall: Sock;Blue;Bed Memory Recall Sock: Without Cue Memory Recall Blue: Without Cue Memory Recall Bed: Without Cue Attention: Sustained;Selective Sustained Attention: Appears intact Selective Attention: Appears intact Awareness: Appears intact Problem Solving: Appears intact Safety/Judgment: Appears intact Sensation Sensation Light Touch: Impaired by gross assessment Hot/Cold: Appears Intact Proprioception: Impaired by gross assessment Stereognosis: Not tested Coordination Gross Motor Movements are Fluid and Coordinated: No Fine Motor Movements are Fluid and Coordinated: Yes Coordination and Movement Description: decreased precision movements due to lack of sensation and proprioception with decreased strength RLE Motor  Motor Motor: Motor apraxia Motor - Skilled Clinical Observations: RLE apraxia resulting in impaired foot placement. Absent sensation in RLE as well  Trunk/Postural Assessment  Cervical Assessment Cervical Assessment: Exceptions to [facility] (forward head) Thoracic Assessment Thoracic Assessment: Exceptions to [facility] (slight kyphosis) Lumbar Assessment Lumbar Assessment: Exceptions to [facility] (posterior pelvic tilt) Postural Control Postural Control: Within Functional Limits   Balance Balance Balance Assessed: Yes Static Sitting Balance Static Sitting - Balance Support: Feet supported;No upper extremity supported Static Sitting - Level of Assistance: 5: Stand by assistance Dynamic Sitting Balance Dynamic Sitting - Balance Support: Feet supported;During functional activity Dynamic Sitting - Level of Assistance: 4: Min assist Static Standing Balance Static Standing - Balance Support: No upper extremity supported Static Standing - Level of Assistance: 3: Mod assist Dynamic Standing Balance Dynamic Standing - Balance Support: During functional activity;Bilateral upper extremity supported Dynamic Standing - Level of Assistance: 2: Max assist Extremity/Trunk Assessment RUE Assessment RUE Assessment: Within Functional Limits LUE Assessment LUE Assessment: Within Functional Limits  Care Tool Care Tool Self Care Eating   Eating Assist Level: Independent    Oral Care    Oral Care Assist  Level: Moderate Assistance - Patient 50 - 74% (standing sinkside)    Bathing   Body parts bathed by patient: Right arm;Left arm;Chest;Abdomen;Front perineal area;Right upper leg;Left upper leg;Left lower leg;Face;Right lower leg     Assist Level: Moderate Assistance - Patient 50 - 74% (for standing balance)    Upper Body Dressing(including orthotics)   What is the patient wearing?: Pull over shirt   Assist Level: Set up assist    Lower Body Dressing (excluding footwear)   What is the patient wearing?: Pants;Incontinence brief Assist for lower body dressing: Moderate Assistance - Patient 50 - 74%    Putting on/Taking off footwear   What is the patient wearing?: Shoes;Socks Assist for footwear: Moderate Assistance - Patient 50 - 74%       Care Tool Toileting Toileting activity   Assist for toileting: Maximal Assistance - Patient 25 - 49%     Care Tool Bed Mobility Roll left and right activity        Sit to lying activity        Lying to sitting edge of bed  activity         Care Tool Transfers Sit to stand transfer        Chair/bed transfer         Toilet transfer   Assist Level: Total Assistance - Patient < 25%     Care Tool Cognition Expression of Ideas and Wants Expression of Ideas and Wants: Without difficulty (complex and basic) - expresses complex messages without difficulty and with speech that is clear and easy to understand   Understanding Verbal and Non-Verbal Content Understanding Verbal and Non-Verbal Content: Understands (complex and basic) - clear comprehension without cues or repetitions   Memory/Recall Ability *first 3 days only Memory/Recall Ability *first 3 days only: Current season;Location of own room;Staff names and faces    Refer to Care Plan for Long Term Goals  SHORT TERM GOAL WEEK 1 OT Short Term Goal 1 (Week 1): Pt will complete toileting with min assist OT Short Term Goal 2 (Week 1): Pt will complete toilet transfer with CGA OT Short Term Goal 3 (Week 1): Pt will complete LB dressing with min assist OT Short Term Goal 4 (Week 1): Pt will complete LB bathing with min assist  Recommendations for other services: None    Skilled Therapeutic Intervention ADL ADL Grooming: Setup (sitting at w/c) Where Assessed-Grooming: Sitting at sink Upper Body Bathing: Setup Where Assessed-Upper Body Bathing: Sitting at sink Lower Body Bathing: Moderate assistance Where Assessed-Lower Body Bathing: Sitting at sink;Standing at sink Upper Body Dressing: Setup Where Assessed-Upper Body Dressing: Sitting at sink Lower Body Dressing: Moderate assistance Where Assessed-Lower Body Dressing: Sitting at sink;Standing at sink Toileting: Maximal assistance Where Assessed-Toileting: Other (Comment) (3 in 1 commode over toilet) Toilet Transfer: Moderate assistance Toilet Transfer Method: Squat pivot Toilet Transfer Equipment: Drop arm bedside commode Mobility  Bed Mobility Bed Mobility: Rolling Right;Rolling Left;Supine to  Sit;Sit to Supine Rolling Right: Minimal Assistance - Patient > 75% Rolling Left: Minimal Assistance - Patient > 75% Supine to Sit: Minimal Assistance - Patient > 75% Sit to Supine: Contact Guard/Touching assist Transfers Sit to Stand: Maximal Assistance - Patient 25-49% Stand to Sit: Maximal Assistance - Patient 25-49%   Skilled Intervention:  First session: Pt supine in bed, agreeable to OT session.  Initial evaluation complete, educated pt on OT scope of practice, and collaborated with pt regarding OT POC.  Pt completed self care and functional mobility per above  levels of assist required.  Pt requesting to return to bed at end of session.  Call bell in reach, bed alarm on.    Second Session: Pt supine in bed, no c/o pain at rest, agreeable to OT session.  Pt participated stedy transfer EOB to 3 in 1 commode over toilet needing mod assist sit<>stand and VCs for body mechanics.  Pt completed toileting with max assist and had continent episode of bladder.  Pt then participated in blocked practice squat pivot transfers EOB<>w/c and w/c<>3 in 1 commode.  Pt required min assist initially, then faded to [place].  Pt also needing VCs to facilitate checking safe BLE placement due to limited sensation.  Pt requesting to return to supine in bed.  Pt completed sit to supine with CGA.  Call bell in reach, bed alarm on.   Discharge Criteria: Patient will be discharged from OT if patient refuses treatment 3 consecutive times without medical reason, if treatment goals not met, if there is a change in medical status, if patient makes no progress towards goals or if patient is discharged from hospital.  The above assessment, treatment plan, treatment alternatives and goals were discussed and mutually agreed upon: by patient  Ezekiel Slocumb 02/29/2020, 4:06 PM

## 2020-02-29 NOTE — Progress Notes (Signed)
Lower extremity venous has been completed.   Preliminary results in CV Proc.   Blanch Media 02/29/2020 2:47 PM

## 2020-02-29 NOTE — Evaluation (Signed)
Physical Therapy Assessment and Plan  Patient Details  Name: Timothy Ware MRN: [id number] Date of Birth: [birth year]  PT Diagnosis: Abnormality of gait, Ataxic gait, Difficulty walking, Impaired sensation, Low back pain and Muscle weakness Rehab Potential: Good ELOS: 2 weeks   Today's Date: 02/29/2020 PT Individual Time: 0900-1000 + 1300-1345 PT Individual Time Calculation (min): 60 min  + 45 min  Hospital Problem: Principal Problem:   HNP (herniated nucleus pulposus with myelopathy), thoracic Active Problems:   Thoracic myelopathy   Hypoalbuminemia due to protein-calorie malnutrition ([place])   Essential hypertension   Postoperative pain   Past Medical History:  Past Medical History:  Diagnosis Date  . Anemia   . Back pain with radiculopathy   . Coronary artery disease   . DJD (degenerative joint disease)   . Hypertension   . PONV (postoperative nausea and vomiting)   . Vitamin D deficiency    Past Surgical History:  Past Surgical History:  Procedure Laterality Date  . BACK SURGERY    . CARDIAC CATHETERIZATION  10/11/1996   NL LM, insignificant LAD, 75% pRCA, 95% dRCA, 75% OM2, EF 72%. S/p RCA stents x2 & CX x1.  Marland Kitchen CARPAL TUNNEL RELEASE Bilateral   . CORONARY ANGIOPLASTY     RCA PTCA/stent 10/29/1994; RCA stents x2, Cx x1, [facility] 10/11/1996  . ELBOW SURGERY Left   . EYE SURGERY Bilateral    cataract  . ROTATOR CUFF REPAIR Left   . THORACIC DISCECTOMY N/A 02/25/2020   Procedure: TRANSPEDICULAR MICRODISCECTOMY THORACIC TEN THORACIC ELEVEN;  Surgeon: Erline Levine, MD;  Location: [place];  Service: Neurosurgery;  Laterality: N/A;  posterior    Assessment & Plan Clinical Impression: Patient is a 85 year old right-handed male with history of CAD/PTCA maintained on aspirin. Per chart review lives with spouse. Independent with assistive device. 1 level home one-step to entry. Family assistance as needed. Presented 02/24/2020 with progressive weakness and back pain as well as  numbness lower extremities right greater than left. X-rays and imaging revealed thoracic disc herniation T10-T11 with thoracic myelopathy and canal stenosis. Underwent transpedicular microdiscectomy thoracic 10-thoracic 11 02/25/2020 per Dr. Vertell Limber. Decadron taper as directed. No brace required.Admission chemistries showed a glucose 136 creatinine 1.34 and hemoglobin 12.1. No postoperative labs have been ordered. He is tolerating a regular diet. Therapy evaluations completed and patient was admitted for a comprehensive rehab program. Patient transferred to CIR on 02/28/2020 .   Patient currently requires max with mobility secondary to muscle weakness, impaired timing and sequencing, unbalanced muscle activation and decreased motor planning and decreased standing balance, decreased postural control and decreased balance strategies.  Prior to hospitalization, patient was independent  with mobility and lived with Spouse in a House home.  Home access is 1Stairs to enter.  Patient will benefit from skilled PT intervention to maximize safe functional mobility, minimize fall risk and decrease caregiver burden for planned discharge home with 24 hour assist.  Anticipate patient will benefit from follow up [facility] at discharge.  PT - End of Session Activity Tolerance: Tolerates 30+ min activity with multiple rests Endurance Deficit: Yes Endurance Deficit Description: Pain limiting as well as general deconditioning. Rest breaks required for recovery during functional mobility PT Assessment Rehab Potential (ACUTE/IP ONLY): Good PT Barriers to Discharge: Home environment access/layout;Insurance for SNF coverage;Decreased caregiver support PT Patient demonstrates impairments in the following area(s): Balance;Endurance;Motor;Pain;Sensory;Skin Integrity PT Transfers Functional Problem(s): Bed Mobility;Bed to Chair;Car PT Locomotion Functional Problem(s): Ambulation;Stairs PT Plan PT Intensity: Minimum of 1-2 x/day  ,45 to 90 minutes  PT Frequency: 5 out of 7 days PT Duration Estimated Length of Stay: 2 weeks PT Treatment/Interventions: Ambulation/gait training;Balance/vestibular training;Community reintegration;Functional electrical stimulation;Patient/family education;Stair training;UE/LE Coordination activities;UE/LE Strength taining/ROM;Splinting/orthotics;Pain management;DME/adaptive equipment instruction;Cognitive remediation/compensation;Disease management/prevention;Neuromuscular re-education;Skin care/wound management;Therapeutic Exercise;Wheelchair propulsion/positioning;Visual/perceptual remediation/compensation;Therapeutic Activities;Psychosocial support;Functional mobility training;Discharge planning PT Transfers Anticipated Outcome(s): Supervision with LRAD PT Locomotion Anticipated Outcome(s): Supervision with LRAD PT Recommendation Follow Up Recommendations: 24 hour supervision/assistance;Other (comment) (HHPT vs OPPT pending transportation and progress) Patient destination: Home Equipment Recommended: To be determined PT Evaluation Precautions/Restrictions Precautions Precautions: Back;Fall Precaution Comments: General post-op back precautions Restrictions Weight Bearing Restrictions: No General Chart Reviewed: Yes Additional Pertinent History: HTN, multiple prior lumbar surgeries (most recent in ~2013) Family/Caregiver Present: No  Pain Pain Assessment Pain Scale: 0-10 Pain Score: 0-No pain Pain Type: Surgical pain Pain Location: Back Pain Orientation: Mid Pain Descriptors / Indicators: Aching Pain Onset: On-going Patients Stated Pain Goal: 0 Reports 0/10 pain at rest. Reports 8/10 pain with mobility. Rest breaks and emotional support provided for pain management.  Home Living/Prior Functioning Home Living Available Help at Discharge: Family;Available 24 hours/day;Other (Comment) (x3 children PRN assist, live within ~62mles. Wife 24/7 S/A) Type of Home: House Home Access:  Stairs to enter ECenterPoint Energyof Steps: 1 Entrance Stairs-Rails: Right;Left Home Layout: Two level;Able to live on main level with bedroom/bathroom;Other (Comment);Full bath on main level;Laundry or work area in basement ([employer] Corporationwith bedrooms and lMedical sales representative Bathroom Shower/Tub: Tub/shower unit  Lives With: Spouse Prior Function Level of Independence: Independent with gait;Independent with transfers;Other (comment) (Occasionaly would use SPC when ambulating community distances)  Able to Take Stairs?: Yes (Would go up/down stepping sideways due to LLE weakness) Driving: Yes 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 Vision/Perception  Vision - Assessment Additional Comments: Wears readers PGeologist, engineering Within FAdvertising copywriterPraxis Praxis Impairment Details: MSurveyor, mineralsComments: RLE  Cognition Overall Cognitive Status: Within Functional Limits for tasks assessed Arousal/Alertness: Awake/alert Orientation Level: Oriented X4 Attention: Sustained;Selective Sustained Attention: Appears intact Selective Attention: Appears intact Memory: Appears intact Awareness: Appears intact Problem Solving: Appears intact Safety/Judgment: Appears intact Sensation Sensation Light Touch: Impaired by gross assessment Hot/Cold: Not tested Proprioception: Impaired by gross assessment (RLE > LLE) Stereognosis: Not tested Coordination Gross Motor Movements are Fluid and Coordinated: Yes (Pain inhibition and RLE weakness/sensory deficits) Fine Motor Movements are Fluid and Coordinated: Yes Heel Shin Test: Limited by weakness Motor  Motor Motor: Motor apraxia Motor - Skilled Clinical Observations: RLE apraxia with functional gait resulting in impaired foot placement and cadence. Absent sensation in RLE as well  Trunk/Postural Assessment  Cervical Assessment Cervical Assessment: Ex[facility] to [facility] Thoracic Assessment Thoracic  Assessment: [facility] to [facility](Deferred formal testing due to T10/11 microdisectomy. Mild Kyphosis noted) Lumbar Assessment Lumbar Assessment: Ex[facility] to [facility](Posterior pelvic tilt. History of lumbar surgeries) Postural Control Postural Control: Within Functional Limits  Balance Balance Balance Assessed: Yes Static Sitting Balance Static Sitting - Balance Support: Feet supported;No upper extremity supported Static Sitting - Level of Assistance: 5: Stand by assistance Dynamic Sitting Balance Dynamic Sitting - Balance Support: Feet supported;During functional activity Dynamic Sitting - Level of Assistance: 4: Min assist (due to posterior lean) Static Standing Balance Static Standing - Balance Support: No upper extremity supported Static Standing - Level of Assistance: 2: Max assist;3: Mod assist (Due to posterior lean) Dynamic Standing Balance Dynamic Standing - Balance Support: During functional activity;Bilateral upper extremity supported Dynamic Standing - Level of Assistance: 3: Mod assist Extremity Assessment  RUE Assessment RUE Assessment: Within Functional  Limits LUE Assessment LUE Assessment: Within Functional Limits RLE Assessment RLE Assessment: Exceptions to [facility] General Strength Comments: Ankle DF/PF 4/5, knee ext 4-/5, hip flex 3+/5 LLE Assessment LLE Assessment: Exceptions to [facility] General Strength Comments: Ankle DF/PF 4+/5, knee ext 4-/5, hip flex 3/5  Care Tool Care Tool Bed Mobility Roll left and right activity   Roll left and right assist level: Moderate Assistance - Patient 50 - 74%    Sit to lying activity   Sit to lying assist level: Moderate Assistance - Patient 50 - 74%    Lying to sitting edge of bed activity   Lying to sitting edge of bed assist level: Moderate Assistance - Patient 50 - 74%     Care Tool Transfers Sit to stand transfer   Sit to stand assist level: Maximal Assistance - Patient 25 - 49%    Chair/bed transfer   Chair/bed transfer  assist level: Moderate Assistance - Patient 50 - 74%     Psychologist, counselling transfer activity did not occur: Safety/medical concerns        Care Tool Locomotion Ambulation   Assist level: 2 helpers Assistive device: Parallel bars Max distance: 13f  Walk 10 feet activity Walk 10 feet activity did not occur: Safety/medical concerns       Walk 50 feet with 2 turns activity Walk 50 feet with 2 turns activity did not occur: Safety/medical concerns      Walk 150 feet activity Walk 150 feet activity did not occur: Safety/medical concerns      Walk 10 feet on uneven surfaces activity Walk 10 feet on uneven surfaces activity did not occur: Safety/medical concerns      Stairs Stair activity did not occur: Safety/medical concerns        Walk up/down 1 step activity Walk up/down 1 step or curb (drop down) activity did not occur: Safety/medical concerns     Walk up/down 4 steps activity did not occuR: Safety/medical concerns  Walk up/down 4 steps activity      Walk up/down 12 steps activity Walk up/down 12 steps activity did not occur: Safety/medical concerns      Pick up small objects from floor Pick up small object from the floor (from standing position) activity did not occur: Safety/medical concerns      Wheelchair Will patient use wheelchair at discharge?: No     Wheelchair assist level: Supervision/Verbal cueing Max wheelchair distance: 2057f Wheel 50 feet with 2 turns activity   Assist Level: Supervision/Verbal cueing  Wheel 150 feet activity   Assist Level: Supervision/Verbal cueing;Dependent - Patient 0%    Refer to Care Plan for Long Term Goals  SHORT TERM GOAL WEEK 1 PT Short Term Goal 1 (Week 1): Pt will complete bed mobility with minA PT Short Term Goal 2 (Week 1): Pt will perform bed<>chair with minA and LRAD PT Short Term Goal 3 (Week 1): Pt will ambulate 2570fith minA and LRAD PT Short Term Goal 4 (Week 1): Pt will initiate stair  training  Recommendations for other services: None   Skilled Therapeutic Intervention Mobility Bed Mobility Bed Mobility: Rolling Right;Rolling Left;Supine to Sit;Sit to Supine Rolling Right: Moderate Assistance - Patient 50-74% Rolling Left: Moderate Assistance - Patient 50-74% Supine to Sit: Moderate Assistance - Patient 50-74% Transfers Transfers: Sit to Stand;Stand to Sit;Squat Pivot Transfers Sit to Stand: Maximal Assistance - Patient 25-49% Stand to Sit: Maximal Assistance - Patient 25-49% Squat Pivot Transfers:  Moderate Assistance - Patient 50-74% Transfer (Assistive device): None Locomotion  Gait Ambulation: Yes Gait Assistance: 2 Helpers (w/c follow for safety) Gait Distance (Feet): 8 Feet Assistive device: Parallel bars Gait Assistance Details: Tactile cues for placement;Tactile cues for weight beaing;Verbal cues for precautions/safety;Manual facilitation for weight shifting;Verbal cues for technique;Verbal cues for sequencing;Verbal cues for gait pattern;Visual cues/gestures for sequencing;Manual facilitation for placement Gait Assistance Details: B knee block for safety during stance phase of gait Gait Gait: Yes Gait Pattern: Impaired Gait Pattern: Step-to pattern;Decreased step length - right;Decreased stance time - right;Decreased weight shift to right;Right hip hike;Right genu recurvatum;Poor foot clearance - right Gait velocity: decreased Stairs / Additional Locomotion Stairs: No Wheelchair Mobility Wheelchair Mobility: Yes Wheelchair Assistance: Chartered loss adjuster: Both upper extremities Wheelchair Parts Management: Needs assistance Distance: 228f  Skilled Intervention:  1st session: Pt greeted supine in bed, awake and agreeable to PT session. Pt is pleasant and cooperative throughout session, A&Ox4. Initiated functional mobility as outlined above. He required modA for rolling in bed without bed features, performed supine<>sit  with modA (primarily for trunk elevation), and was able to sit EOB with SBA. He required maxA and R knee block for sit>stand from lowered EOB and was able to complete squat<>pivot transfer with modA from EOB to w/c with intervention for technique. Provided patient with cushion for w/c for improved sitting tolerance and positioning in w/c. Wheeled in chair with totalA for time management inside // bars and he was able to perform sit>stand with min/modA from w/c to // bars and he ambulated ~842fwith modA and chair follow for safety with therapist assisting with lateral weight shifting, B knee blocking during stance, and cues for reducing posterior bias. Pt propelled himself back to his room with supervision in w/c using BUE's only and ended session seated in w/c with chair alarm on and needs in reach.  Instructed pt in results of PT evaluation as detailed above, PT POC, rehab potential, rehab goals, and discharge recommendations. Additionally discussed CIR's policies regarding fall safety and use of chair alarm and/or quick release belt. Pt verbalized understanding and in agreement. Will update pt's family members as they become available.   2nd session: Pt received supine in bed with wife at bedside, pt agreeable to PT session. No reports of pain. Wife reports limited ability to provide physical assist at home due to history of chronic back pain. Performed supine<>sit with minA with HOB flat, cues for log rolling technique. Performed squat<>pivot with modA from EOB to w/c and required cues for lifting hips rather than sliding his hips, which he was able to do. Transported in w/c with totalA for time management and performed car transfer with modA in similar fashion from w/c to car with car height set to a midsize SUV level to mimic his wife's. Pt then wheeled to main therapy gym and placed inside // bars. Performed sit<>stand with min/modA and R knee block with cues for forward weight shift and pushing from  armrests on chair rather than pulling himself up from bars. Posterior bias in standing but able to correct with verbal cues. Performed pre-gait training for remainder of session, including lateral weight shifts and forward/backward stepping with LLE with modA and R knee block. He also performed forward/backward stepping with RLE but lacks awareness of foot placement and would often scissor his foot over his other. Performed repeated squats in // bars with modA for risiing and for controlled lowering due to RLE weakness. He was then returned to  his room with totalA in his w/c and performed squat<>pivot with modA back to his bed. Able to go from sit>supine with supervision and he remained semi-reclined in bed at end of session with needs in reach and bed alarm on.  Discharge Criteria: Patient will be discharged from PT if patient refuses treatment 3 consecutive times without medical reason, if treatment goals not met, if there is a change in medical status, if patient makes no progress towards goals or if patient is discharged from hospital.  The above assessment, treatment plan, treatment alternatives and goals were discussed and mutually agreed upon: by patient  Alger Simons PT, DPT 02/29/2020, 10:04 AM

## 2020-02-29 NOTE — Progress Notes (Signed)
Patient Details  Name: Timothy Ware MRN: [id number] Date of Birth: June 09, 1934  Today's Date: 02/29/2020  Hospital Problems: Principal Problem:   HNP (herniated nucleus pulposus with myelopathy), thoracic Active Problems:   Thoracic myelopathy   Hypoalbuminemia due to protein-calorie malnutrition [facility])   Essential hypertension   Postoperative pain  Past Medical History:  Past Medical History:  Diagnosis Date  . Anemia   . Back pain with radiculopathy   . Coronary artery disease   . DJD (degenerative joint disease)   . Hypertension   . PONV (postoperative nausea and vomiting)   . Vitamin D deficiency    Past Surgical History:  Past Surgical History:  Procedure Laterality Date  . BACK SURGERY    . CARDIAC CATHETERIZATION  10/11/1996   NL LM, insignificant LAD, 75% pRCA, 95% dRCA, 75% OM2, EF 72%. S/p RCA stents x2 & CX x1.  Marland Kitchen CARPAL TUNNEL RELEASE Bilateral   . CORONARY ANGIOPLASTY     RCA PTCA/stent 10/29/1994; RCA stents x2, Cx x1, [facility] 10/11/1996  . ELBOW SURGERY Left   . EYE SURGERY Bilateral    cataract  . ROTATOR CUFF REPAIR Left   . THORACIC DISCECTOMY N/A 02/25/2020   Procedure: TRANSPEDICULAR MICRODISCECTOMY THORACIC TEN THORACIC ELEVEN;  Surgeon: Maeola Harman, MD;  Location: [facility];  Service: Neurosurgery;  Laterality: N/A;  posterior   Social History:  reports that he has never smoked. He has never used smokeless tobacco. He reports that he does not drink alcohol and does not use drugs.  Family / Support Systems Marital Status: Married Patient Roles: Spouse, Parent Spouse/Significant Other: Timothy Ware [phone] Children: Timothy Ware-daughter [phone] Other Supports: Two other children local and supportive Anticipated Caregiver: Wife and children Ability/Limitations of Caregiver: Can provide 24 hr care if needed. Wife is working as Clinical biochemist: 24/7 Family Dynamics: Close knit with family who will pull together to provide  what assist Dad needs. They are still processing that he is requiring assist after what they thought was simple back surgery  Social History Preferred language: English Religion:  Cultural Background: No issues Education: HS Read: Yes Write: Yes Employment Status: Retired Marine scientist Issues: No issues Guardian/Conservator: None-according to MD pt is capable of making his own decisions while here   Abuse/Neglect Abuse/Neglect Assessment Can Be Completed: Yes Physical Abuse: Denies Verbal Abuse: Denies Sexual Abuse: Denies Exploitation of patient/patient's resources: Denies Self-Neglect: Denies  Emotional Status Pt's affect, behavior and adjustment status: Pt is motivated to do well here and make progress to recover from this surgery and regain his mobility. He is still processing his deficits that have occurred from his back surgery. He has always been able to be independent and taken care of himself. Recent Psychosocial Issues: other health issues was managing prior to back issues which started 2-3 weeks ago Psychiatric History: No history do feel he would benefit from seeing neuro-psych while here for coping. Will place on neuro-psych list Substance Abuse History: No issues  Patient / Family Perceptions, Expectations & Goals Pt/Family understanding of illness & functional limitations: Pt and wfie can explain his back surgery and deficits as a result of his surgery. They do talk with MD and feel they have a basic understanding but still processing his issues as a result of the surgery. Premorbid pt/family roles/activities: Father, husband, grandfather, retiree, home owner, church member Anticipated changes in roles/activities/participation: resume Pt/family expectations/goals: Pt states: " I want to get back to where I was before surgery."  Wife states: "  I hope he can walk and move around that would help me."  [employer]: None Premorbid  Home Care/DME Agencies: Other (Comment) (has quad cane, cane, rw, tub seat) Transportation available at discharge: Wife can provide transportation Resource referrals recommended: Neuropsychology  Discharge Planning Living Arrangements: Spouse/significant other Support Systems: Spouse/significant other, Children, Other relatives, Friends/neighbors Type of Residence: Private residence Insurance Resources: [employer], Media planner (specify) Building services engineer) Financial Resources: Social Security Financial Screen Referred: No Living Expenses: Own Money Management: Patient, Spouse Does the patient have any problems obtaining your medications?: No Home Management: wife Patient/Family Preliminary Plans: Return home with wife who is planning on quitting her crossing guard job with the elementary school, so can be there with pt at home. Aware team evaluating pt and setting goals today with him. He is motivated to do well here Care Coordinator Barriers to Discharge: Decreased caregiver support Care Coordinator Barriers to Discharge Comments: Wife still working as crossing guard at this time-gone for a few hours each day Care Coordinator Anticipated Follow Up Needs: HH/OP  Clinical Impression Pleasant but somewhat cranky gentleman who is ready to do therapies, but tries not to drink liquids so he will not have to use the restroom. Discussed the importance of hydrating himself wit his medications he is taking. Will await therapy evaluations and work on discharge needs. Pt does have a PCP-Dr Pradhan in [place]  Lucy Chris 02/29/2020, 10:23 AM

## 2020-02-29 NOTE — Progress Notes (Signed)
[facility] Individual Statement of Services  Patient Name:  Timothy Ware  Date:  02/29/2020  Welcome to the [facility].  Our goal is to provide you with an individualized program based on your diagnosis and situation, designed to meet your specific needs.  With this comprehensive rehabilitation program, you will be expected to participate in at least 3 hours of rehabilitation therapies Monday-Friday, with modified therapy programming on the weekends.  Your rehabilitation program will include the following services:  Physical Therapy (PT), Occupational Therapy (OT), 24 hour per day rehabilitation nursing, Neuropsychology, Care Coordinator, Rehabilitation Medicine, Nutrition Services and Pharmacy Services  Weekly team conferences will be held on Wednesday to discuss your progress.  Your Inpatient Rehabilitation Care Coordinator will talk with you frequently to get your input and to update you on team discussions.  Team conferences with you and your family in attendance may also be held.  Expected length of stay: 12-14 days  Overall anticipated outcome: supervision with cueing  Depending on your progress and recovery, your program may change. Your Inpatient Rehabilitation Care Coordinator will coordinate services and will keep you informed of any changes. Your Inpatient Rehabilitation Care Coordinator's name and contact numbers are listed  below.  The following services may also be recommended but are not provided by the Inpatient Rehabilitation Center:   Driving Evaluations  Home Health Rehabiltiation Services  Outpatient Rehabilitation Services    Arrangements will be made to provide these services after discharge if needed.  Arrangements include referral to agencies that provide these services.  Your insurance has been verified to be:  Medicare & AARP Your primary doctor is:  Corrie Mckusick  Pertinent information will be shared with your doctor  and your insurance company.  Inpatient Rehabilitation Care Coordinator:  Dossie Der, [place] [phone] or Luna Glasgow  Information discussed with and copy given to patient by: Lucy Chris, 02/29/2020, 10:25 AM

## 2020-03-01 ENCOUNTER — Inpatient Hospital Stay (HOSPITAL_COMMUNITY): Payer: Medicare Other

## 2020-03-01 ENCOUNTER — Encounter (HOSPITAL_COMMUNITY): Payer: Medicare Other | Admitting: Psychology

## 2020-03-01 ENCOUNTER — Inpatient Hospital Stay (HOSPITAL_COMMUNITY): Payer: Medicare Other | Admitting: Physical Therapy

## 2020-03-01 DIAGNOSIS — G8918 Other acute postprocedural pain: Secondary | ICD-10-CM | POA: Diagnosis not present

## 2020-03-01 DIAGNOSIS — I10 Essential (primary) hypertension: Secondary | ICD-10-CM | POA: Diagnosis not present

## 2020-03-01 DIAGNOSIS — R0989 Other specified symptoms and signs involving the circulatory and respiratory systems: Secondary | ICD-10-CM

## 2020-03-01 DIAGNOSIS — T380X5A Adverse effect of glucocorticoids and synthetic analogues, initial encounter: Secondary | ICD-10-CM

## 2020-03-01 DIAGNOSIS — M4714 Other spondylosis with myelopathy, thoracic region: Secondary | ICD-10-CM | POA: Diagnosis not present

## 2020-03-01 DIAGNOSIS — M5104 Intervertebral disc disorders with myelopathy, thoracic region: Secondary | ICD-10-CM | POA: Diagnosis not present

## 2020-03-01 DIAGNOSIS — R739 Hyperglycemia, unspecified: Secondary | ICD-10-CM

## 2020-03-01 MED ORDER — DEXAMETHASONE 4 MG PO TABS
4.0000 mg | ORAL_TABLET | Freq: Two times a day (BID) | ORAL | Status: DC
  Administered 2020-03-01 – 2020-03-06 (×10): 4 mg via ORAL
  Filled 2020-03-01 (×9): qty 1

## 2020-03-01 NOTE — Progress Notes (Signed)
Occupational Therapy Session Note  Patient Details  Name: Timothy Ware MRN: [id number] Date of Birth: 1934/06/27  Today's Date: 03/01/2020 OT Individual Time: 0700-0800 OT Individual Time Calculation (min): 60 min    Short Term Goals: Week 1:  OT Short Term Goal 1 (Week 1): Pt will complete toileting with min assist OT Short Term Goal 2 (Week 1): Pt will complete toilet transfer with CGA OT Short Term Goal 3 (Week 1): Pt will complete LB dressing with min assist OT Short Term Goal 4 (Week 1): Pt will complete LB bathing with min assist  Skilled Therapeutic Interventions/Progress Updates:    1:1. Pt received in bed agreeable to OT. Pt with no pain but numbness especially in RLE. Pt completes supine>sitting EOB with review of log roll technique and CGA for trunk elevation. Pt declines bathing and dressing but agreeable to grooming at sink seated with set up after MOD A squat pivot to w/c with VC for hand placement. Pt shaves at sink with set up. Pt and OT problem solve footwear. Pt completes in seated figure 4 with VC for decreasing twisting when donning socks. Pt and OT discuss TTB transfer/tub transfers/safety. Pt reporting having shower chair and sliding glass door and using top rod of door frame to step into tub.OT edu re decreased safety with this method as pt with numbness in RLE and BLE weakness. Pt verbalized understanding and OT offers option to remove doors, add rod/ curtain and TTB or bathing at sink if not safe to step over tub ledge. Pt reports not minding sink baths at home. Pt completes 3 STS at high low table to practice transitional movement with MIN-MOD A and MOD R knee instability. Exited session with pt seated in bed, exit alarm on and call light in reach   Therapy Documentation Precautions:  Precautions Precautions: Fall, Back Precaution Comments: General post-op back precautions Restrictions Weight Bearing Restrictions: No General:   Vital Signs: Therapy Vitals Temp:  98.1 F (36.7 C) Temp Source: Oral Pulse Rate: (!) 59 Resp: 17 BP: (!) 165/77 Patient Position (if appropriate): Lying Oxygen Therapy SpO2: 98 % Pain:   ADL: ADL Grooming: Setup (sitting at w/c) Where Assessed-Grooming: Sitting at sink Upper Body Bathing: Setup Where Assessed-Upper Body Bathing: Sitting at sink Lower Body Bathing: Moderate assistance Where Assessed-Lower Body Bathing: Sitting at sink, Standing at sink Upper Body Dressing: Setup Where Assessed-Upper Body Dressing: Sitting at sink Lower Body Dressing: Moderate assistance Where Assessed-Lower Body Dressing: Sitting at sink, Standing at sink Toileting: Maximal assistance Where Assessed-Toileting: Other (Comment) (3 in 1 commode over toilet) Toilet Transfer: Moderate assistance Toilet Transfer Method: Squat pivot Toilet Transfer Equipment: Drop arm bedside commode Vision   Perception    Praxis   Exercises:   Other Treatments:     Therapy/Group: Individual Therapy  Shon Hale 03/01/2020, 6:51 AM

## 2020-03-01 NOTE — Progress Notes (Signed)
[facility] PHYSICAL MEDICINE & REHABILITATION PROGRESS NOTE  Subjective/Complaints: Patient seen working with therapy this morning.  He states he slept well overnight.  He states he did not think his first day of therapies went well yesterday, but was told that it did go well.  He notes?  Some improvement in numbness.  ROS: Denies CP, SOB, N/V/D  Objective: Vital Signs: Blood pressure (!) 165/77, pulse (!) 59, temperature 98.1 F (36.7 C), temperature source Oral, resp. rate 17, height 6\' 1"  (1.854 m), weight 88.1 kg, SpO2 98 %. VAS LOWER EXTREMITY VENOUS (DVT)  Result Date: 02/29/2020  Lower Venous DVT Study Indications: Swelling, and Edema.  Comparison Study: no prior Performing Technologist: 14/09/2019 RVS  Examination Guidelines: A complete evaluation includes B-mode imaging, spectral Doppler, color Doppler, and power Doppler as needed of all accessible portions of each vessel. Bilateral testing is considered an integral part of a complete examination. Limited examinations for reoccurring indications may be performed as noted. The reflux portion of the exam is performed with the patient in reverse Trendelenburg.  +---------+---------------+---------+-----------+----------+--------------+ RIGHT    CompressibilityPhasicitySpontaneityPropertiesThrombus Aging +---------+---------------+---------+-----------+----------+--------------+ CFV      Full           Yes      Yes                                 +---------+---------------+---------+-----------+----------+--------------+ SFJ      Full                                                        +---------+---------------+---------+-----------+----------+--------------+ FV Prox  Full                                                        +---------+---------------+---------+-----------+----------+--------------+ FV Mid   Full                                                         +---------+---------------+---------+-----------+----------+--------------+ FV DistalFull                                                        +---------+---------------+---------+-----------+----------+--------------+ PFV      Full                                                        +---------+---------------+---------+-----------+----------+--------------+ POP      Full           Yes      Yes                                 +---------+---------------+---------+-----------+----------+--------------+  PTV      Full                                                        +---------+---------------+---------+-----------+----------+--------------+ PERO     Full                                                        +---------+---------------+---------+-----------+----------+--------------+   +---------+---------------+---------+-----------+----------+--------------+ LEFT     CompressibilityPhasicitySpontaneityPropertiesThrombus Aging +---------+---------------+---------+-----------+----------+--------------+ CFV      Full           Yes      Yes                                 +---------+---------------+---------+-----------+----------+--------------+ SFJ      Full                                                        +---------+---------------+---------+-----------+----------+--------------+ FV Prox  Full                                                        +---------+---------------+---------+-----------+----------+--------------+ FV Mid   Full                                                        +---------+---------------+---------+-----------+----------+--------------+ FV DistalFull                                                        +---------+---------------+---------+-----------+----------+--------------+ PFV      Full                                                         +---------+---------------+---------+-----------+----------+--------------+ POP      Full           Yes      Yes                                 +---------+---------------+---------+-----------+----------+--------------+ PTV      Full                                                        +---------+---------------+---------+-----------+----------+--------------+  PERO     Full                                                        +---------+---------------+---------+-----------+----------+--------------+     Summary: BILATERAL: - No evidence of deep vein thrombosis seen in the lower extremities, bilaterally. - No evidence of superficial venous thrombosis in the lower extremities, bilaterally. -No evidence of popliteal cyst, bilaterally.   *See table(s) above for measurements and observations.    Preliminary    Recent Labs    02/29/20 0456  WBC 9.3  HGB 13.5  HCT 39.4  PLT 151   Recent Labs    02/29/20 0456  NA 136  K 4.5  CL 101  CO2 22  GLUCOSE 130*  BUN 21  CREATININE 1.15  CALCIUM 8.8*    Intake/Output Summary (Last 24 hours) at 03/01/2020 1002 Last data filed at 03/01/2020 0900 Gross per 24 hour  Intake 960 ml  Output 850 ml  Net 110 ml        Physical Exam: BP (!) 165/77   Pulse (!) 59   Temp 98.1 F (36.7 C) (Oral)   Resp 17   Ht 6\' 1"  (1.854 m)   Wt 88.1 kg   SpO2 98%   BMI 25.62 kg/m  Constitutional: No distress . Vital signs reviewed. HENT: Normocephalic.  Atraumatic. Eyes: EOMI. No discharge. Cardiovascular: No JVD.  RRR. Respiratory: Normal effort.  No stridor.  Bilateral clear to auscultation. GI: Non-distended.  BS +. Skin: Warm and dry.  Intact. Back incision with dressing CDI Psych: Normal mood.  Normal behavior. Musc: No edema in extremities.  No tenderness in extremities. Neurologic: Alert Bilateral upper extremities: 5/5 proximal distal Left lower extremity: Hip flexion, knee extension 4 --4/5, ankle dorsiflexion 5/5 Right  lower extremity: Hip flexion, knee extension 4-/5, ankle dorsiflexion 5/5  Sensation diminished to light touch right lower extremity  Assessment/Plan: 1. Functional deficits which require 3+ hours per day of interdisciplinary therapy in a comprehensive inpatient rehab setting.  Physiatrist is providing close team supervision and 24 hour management of active medical problems listed below.  Physiatrist and rehab team continue to assess barriers to discharge/monitor patient progress toward functional and medical goals   Care Tool:  Bathing    Body parts bathed by patient: Right arm, Left arm, Chest, Abdomen, Front perineal area, Right upper leg, Left upper leg, Left lower leg, Face, Right lower leg         Bathing assist Assist Level: Moderate Assistance - Patient 50 - 74% (for standing balance)     Upper Body Dressing/Undressing Upper body dressing   What is the patient wearing?: Pull over shirt    Upper body assist Assist Level: Set up assist    Lower Body Dressing/Undressing Lower body dressing      What is the patient wearing?: Pants, Incontinence brief     Lower body assist Assist for lower body dressing: Moderate Assistance - Patient 50 - 74%     Toileting Toileting    Toileting assist Assist for toileting: Independent with assistive device Assistive Device Comment: urinal   Transfers Chair/bed transfer  Transfers assist     Chair/bed transfer assist level: Moderate Assistance - Patient 50 - 74%     Locomotion Ambulation   Ambulation assist  Assist level: 2 helpers Assistive device: Parallel bars Max distance: 56ft   Walk 10 feet activity   Assist  Walk 10 feet activity did not occur: Safety/medical concerns        Walk 50 feet activity   Assist Walk 50 feet with 2 turns activity did not occur: Safety/medical concerns         Walk 150 feet activity   Assist Walk 150 feet activity did not occur: Safety/medical concerns          Walk 10 feet on uneven surface  activity   Assist Walk 10 feet on uneven surfaces activity did not occur: Safety/medical concerns         Wheelchair     Assist Will patient use wheelchair at discharge?: No      Wheelchair assist level: Supervision/Verbal cueing Max wheelchair distance: 282ft    Wheelchair 50 feet with 2 turns activity    Assist        Assist Level: Supervision/Verbal cueing   Wheelchair 150 feet activity     Assist      Assist Level: Supervision/Verbal cueing, Dependent - Patient 0%    Medical Problem List and Plan: 1.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 Status post transpedicular microdiscectomy T10-T11 02/25/2020. No brace required  Continue CIR  Decadron decreased to 4 twice daily on 12/8, continue to taper  Team conference today to discuss current and goals and coordination of care, home and environmental barriers, and discharge planning with nursing, case manager, and therapies. Please see conference note from today as well.  2. Antithrombotics: -DVT/anticoagulation:SCDs.   Vascular studies reviewed negative for DVT, await official read -antiplatelet therapy: Aspirin 81 mg daily 3.  Postoperative pain:Hydrocodone and Robaxin as needed  Controlled with meds on 12/8  Monitor with increased exertion 4. Mood:Provide emotional support -antipsychotic agents: N/A 5. Neuropsych: This patientiscapable of making decisions on hisown behalf. 6. Skin/Wound Care:Routine skin checks 7. Fluids/Electrolytes/Nutrition:Routine in and outs. 8. Hypertension. Tenormin 25 mg daily, Lotensin 10 mg daily.   Labile on 12/8, monitor for trend  Monitor the increased mobility 9. CAD with PTCA. Aspirin 81 mg daily. 10. Hyperlipidemia. Lipitor 11.  Hypoalbuminemia  Supplement initiated  12.  Steroid-induced hyperglycemia  Monitor with tapering steroids  LOS: 2 days A FACE TO  FACE EVALUATION WAS PERFORMED  Tommaso Cavitt [name] 03/01/2020, 10:02 AM

## 2020-03-01 NOTE — Consult Note (Signed)
SEL:TRVU Otting is an 85 year old right-handed male with history of CAD/PTCA maintained on aspirin. Per chart review lives with spouse. Independent with assistive device. 1 level home one-step to entry. Family assistance as needed. Presented 02/24/2020 with progressive weakness and back pain as well as numbness lower extremities right greater than left. X-rays and imaging revealed thoracic disc herniation T10-T11 with thoracic myelopathy and canal stenosis. Underwent transpedicular microdiscectomy thoracic 10-thoracic 11 02/25/2020 per Dr. Venetia Maxon. Decadron taper as directed. No brace required.Admission chemistries showed a glucose 136 creatinine 1.34 and hemoglobin 12.1. No postoperative labs have been ordered. He is tolerating a regular diet. Therapy evaluations completed and patient was admitted for a comprehensive rehab program.

## 2020-03-01 NOTE — Progress Notes (Signed)
Patient ID: Timothy Ware, male   DOB: Mar 18, 1935, 85 y.o.   MRN: [id number]  Met with pt and daughter who ws present in the room to discuss team conference goals of supervision and target discharge date 12/21. Pt feels will need to wait and see he does not see the progress the therapy team see's. He was told by MD it will take tome to heal and recover and his spinal cord to heal. He wants to be as independent as possible and not have to really upon his wife who has back issues so she can't provide any physical assist. Will continue to work on discharge needs and see if makes the progress team feels he will and will meet supervision level goals

## 2020-03-01 NOTE — Patient Care Conference (Signed)
Inpatient RehabilitationTeam Conference and Plan of Care Update Date: 03/01/2020   Time: 11:08 AM    Patient Name: Timothy Ware      Medical Record Number: [id number]  Date of Birth: 1934-11-01 Sex: Male         Room/Bed: 4M07C/4M07C-01 Payor Info: Payor: MEDICARE / Plan: MEDICARE PART A AND B / Product Type: *No Product type* /    Admit Date/Time:  02/28/2020  4:15 PM  Primary Diagnosis:  HNP (herniated nucleus pulposus with myelopathy), thoracic  Hospital Problems: Principal Problem:   HNP (herniated nucleus pulposus with myelopathy), thoracic Active Problems:   Thoracic myelopathy   Hypoalbuminemia due to protein-calorie malnutrition (HCC)   Essential hypertension   Postoperative pain   Steroid-induced hyperglycemia   Labile blood pressure    Expected Discharge Date: Expected Discharge Date: 03/14/20  Team Members Present: Physician leading conference: Dr. Maryla Morrow Care Coodinator Present: Chana Bode, RN, BSN, CRRN;Becky Dupree, LCSW Nurse Present: Otilio Carpen, RN PT Present: Wynelle Link, PT OT Present: Dolphus Jenny, OT PPS Coordinator present : Fae Pippin, [place], PT     Current Status/Progress Goal Weekly Team Focus  Bowel/Bladder   B/B- continent  remain continent of B/B  assess B/B q shift and PRN   Swallow/Nutrition/ Hydration             ADL's   setup UB ADLs at w/c level, modA LB ADLs sinkside, maxA toileting, min A squat pivots, modA sit<>stands  supervision  Initial Evaluation, self care training, functional transfers, standing balance   Mobility   modA bed mobility, maxA sit<>stand and modA squat<>pivot transfers. gait 75ft in // bars with modA and chair follow  supervision  dynamic standing balance, pre-gait training and gait as able with LRAD, RLE NMR, general strengthening   Communication             Safety/Cognition/ Behavioral Observations            Pain   pain 8of 10  pain <3  assess painq shift and PRN   Skin    surgical incision-mhoneycomb  heel incision and free of infection  assess incision q shift and PRN     Discharge Planning:      Team Discussion: Blood pressure variable; MD monitoring. Patient reports numbness and pain issues. Continent of bowel and bladder. Progress limited by sensory and proprioception deficits in right lower extremity and right knee buckling Patient on target to meet rehab goals: yes, currently min - mod assist squat pivot transfers. Ambulated 8' with mod assist  *See Care Plan and progress notes for long and short-term goals.   Revisions to Treatment Plan:  Sit to stand trials  Teaching Needs: Transfers, toileting, medications, skin care, etc.  Current Barriers to Discharge: Decreased caregiver support and limited physical assistance at discharge  Possible Resolutions to Barriers: Family education     Medical Summary Current Status: Thoracic myelopathy secondary to thoracic disc herniation T10-T11.  Status post transpedicular microdiscectomy T10-T11 02/25/2020  Barriers to Discharge: Medical stability;Other (comments)  Barriers to Discharge Comments: RLE numbness Possible Resolutions to [employer] Focus: Therapies, optimize BP meds, optimize pain, follow CBGs with steroid taper   Continued Need for Acute Rehabilitation Level of Care: The patient requires daily medical management by a physician with specialized training in physical medicine and rehabilitation for the following reasons: Direction of a multidisciplinary physical rehabilitation program to maximize functional independence : Yes Medical management of patient stability for increased activity during participation in an intensive rehabilitation  regime.: Yes Analysis of laboratory values and/or radiology reports with any subsequent need for medication adjustment and/or medical intervention. : Yes   I attest that I was present, lead the team conference, and concur with the assessment and plan of the  team.   Chana Bode B 03/01/2020, 2:14 PM

## 2020-03-01 NOTE — Progress Notes (Signed)
Physical Therapy Session Note  Patient Details  Name: Timothy Ware MRN: [id number] Date of Birth: Nov 20, 1934  Today's Date: 03/01/2020 PT Individual Time: 1400-1500 PT Individual Time Calculation (min): 60 min   Short Term Goals: Week 1:  PT Short Term Goal 1 (Week 1): Pt will complete bed mobility with minA PT Short Term Goal 2 (Week 1): Pt will perform bed<>chair with minA and LRAD PT Short Term Goal 3 (Week 1): Pt will ambulate 56ft with minA and LRAD PT Short Term Goal 4 (Week 1): Pt will initiate stair training  Skilled Therapeutic Interventions/Progress Updates:    Pt received supine in bed, agreeable to PT session, no reports of pain. Donned slip on shoes with totalA for time management. Performed supine<>sit with CGA with HOB flat, effortful. Performed squat<>pivot with modA from EOB to w/c with cues for lifting hips and RUE placement. Pt reporting need to have a BM, therefore, wheeled inside bathroom and performed additional squat<>pivot with similar technique and modA from w/c to 3-1 drop-arm BSC. Pt continent of bowel and bladder, charted. Of note, while he was sitting on 3-1, he was positioned too far posteriorly and due to sensory deficits in his bottom, he was unaware. Unfortunately, this resulted in bowel on rim of seat and resulted in a mess. Resorted to [facility] for transfer off of toilet and required totalA for posterior pericare. Pt unable to stand in [place] for >1 minute without significant fatigue. Therefore, Stedy transfer in perched position back to bed with totalA for energy conservation. Returned to bed and provided posterior pericare with totalA in sidelying position. Pt returned to seated position with minA and performed squat<>pivot with modA from EOB to w/c. W/c transport for time management to ortho gym. Squat<>pivot with modA to Nustep and he completed x6 minutes, initially requiring workload of 1 but able to progress to workload of 4, with ~30 steps/minute cadence. Required  cues for R knee control and stepping pattern. Returned to w/c with modA in similar fashion and returned to his room with an additional squat<>pivot with modA back to bed and sit>supine with supervision. He remained semi-reclined in bed with bed alarm on and needs in reach.  Therapy Documentation Precautions:  Precautions Precautions: Fall, Back Precaution Comments: General post-op back precautions Restrictions Weight Bearing Restrictions: No  Therapy/Group: Individual Therapy  Sanjit Mcmichael P Murriel Eidem PT 03/01/2020, 7:54 AM

## 2020-03-01 NOTE — Progress Notes (Signed)
Physical Therapy Session Note  Patient Details  Name: Timothy Ware MRN: [id number] Date of Birth: 12-04-1934  Today's Date: 03/01/2020 PT Individual Time: 0845-1000 PT Individual Time Calculation (min): 75 min   Short Term Goals: Week 1:  PT Short Term Goal 1 (Week 1): Pt will complete bed mobility with minA PT Short Term Goal 2 (Week 1): Pt will perform bed<>chair with minA and LRAD PT Short Term Goal 3 (Week 1): Pt will ambulate 63f with minA and LRAD PT Short Term Goal 4 (Week 1): Pt will initiate stair training  Skilled Therapeutic Interventions/Progress Updates: Pt presented in bed agreeable to therapy. Pt indicated need for BM, performed supine to sit EOB with CGA and use of bed features. Due to urgency [facility]. PTA performed LB clothing management with minA (+BM). Pt then transported to rehab gym for time management. Participated in STS in parallel bars initially with minA with PTA encouraging pt to use 1 UE to push up from w/c and progressing to CGA. Pt also participated in lateral wt shifting to encourage wt bearing through RLE. Pt progressed to gait in parallel bars with modA with pt compensating initially with force from hip flexors with pt ambulating 419fx 2. PTA provided assistance for R foot placement and blocking R knee. Pt moved over to mat and performed squat pivot transfer to high/low mat with minA. Pt then participated in LAQ x 15 AROM with PTA providing cues for controlled movement and speed. Pt then performed STS x 5 from 22in height with RW and minA with PTA encouraging pt to not push brace against mat. Pt performed additional STS x 3 from 21in height in same manner. Participated in toe taps to 2in step in standing with RW and PTA facilitating wt shifting to R and noted heavy use of BUE. After seated rest pt performed squat pivot to return to w/c with CGA and verbal cues for sequencing. Pt transported back to room and performed squat  pivot transfer to bed in same manner. Pt returned to supine with CGA and repositioned self to comfort. Pt left in bed with bed alarm on, call bell within reach and needs met.      Therapy Documentation Precautions:  Precautions Precautions: Fall, Back Precaution Comments: General post-op back precautions Restrictions Weight Bearing Restrictions: No General:   Vital Signs: Therapy Vitals Temp: 97.6 F (36.4 C) Temp Source: Oral Pulse Rate: 67 Resp: 18 BP: (!) 144/61 Patient Position (if appropriate): Lying Oxygen Therapy SpO2: 97 % O2 Device: Room Air   Therapy/Group: Individual Therapy  Malasha Kleppe  Avina Eberle, PTA  03/01/2020, 3:56 PM

## 2020-03-02 ENCOUNTER — Inpatient Hospital Stay (HOSPITAL_COMMUNITY): Payer: Medicare Other

## 2020-03-02 ENCOUNTER — Inpatient Hospital Stay (HOSPITAL_COMMUNITY): Payer: Medicare Other | Admitting: Occupational Therapy

## 2020-03-02 DIAGNOSIS — R0989 Other specified symptoms and signs involving the circulatory and respiratory systems: Secondary | ICD-10-CM | POA: Diagnosis not present

## 2020-03-02 DIAGNOSIS — I10 Essential (primary) hypertension: Secondary | ICD-10-CM | POA: Diagnosis not present

## 2020-03-02 DIAGNOSIS — M5104 Intervertebral disc disorders with myelopathy, thoracic region: Secondary | ICD-10-CM | POA: Diagnosis not present

## 2020-03-02 DIAGNOSIS — G8918 Other acute postprocedural pain: Secondary | ICD-10-CM | POA: Diagnosis not present

## 2020-03-02 NOTE — Progress Notes (Signed)
Physical Therapy Session Note  Patient Details  Name: Medard Decuir MRN: [id number] Date of Birth: 1934/07/11  Today's Date: 03/02/2020 PT Individual Time: 1100-1200 PT Individual Time Calculation (min): 60 min   Short Term Goals: Week 1:  PT Short Term Goal 1 (Week 1): Pt will complete bed mobility with minA PT Short Term Goal 2 (Week 1): Pt will perform bed<>chair with minA and LRAD PT Short Term Goal 3 (Week 1): Pt will ambulate 30ft with minA and LRAD PT Short Term Goal 4 (Week 1): Pt will initiate stair training  Skilled Therapeutic Interventions/Progress Updates:    Pt greeted seated in w/c with chair alarm on, awake and agreeable to PT session. No reports of pain. W/c transport for energy conservation and time management to day room gym. Performed squat>pivot transfer with modA from w/c to Nustep. He completed x5 minutes at workload of 4 and x5 minutes of workload of 5, focusing on R knee and hip extension as well as rhythmic coordination. Required cues for correcting ModA for squat>pivot back to w/c with cues for lifting and translating his hips. Transported in w/c to // bars. Performed repeated sit>stands in bars with modA for rising with cues for BUE placement to armrests rather than pulling up from bars. Also performed mini-squats in // bars with R knee block and min/modA for steadying, emphasizing eccentric lowering. Also performed pre-gait training, focusing on forward/backward stepping with RLE and forward/backward stepping with LLE, requiring modA for balance. Therapist on stool assisting with R foot placement due to scissoring tendencies. Performed lateral weight shifts in bars with R knee block and minA for balance, noted R knee buckling with weight shifting but he was able to correct 25% of the time. Pt propelled himself with supervision with BUE's in w/c back to his room, >173ft. Upon returning to room, he was requesting urgent need to have a BM. Due to urgency, used [facility] for  transfer from w/c to 3-1 over toilet. RN entered room and handoff of care to nursing staff. Instructed patient on pulling cord when complete or when needing assistance and he demonstrated understanding.   Therapy Documentation Precautions:  Precautions Precautions: Fall,Back Precaution Comments: General post-op back precautions Restrictions Weight Bearing Restrictions: No  Therapy/Group: Individual Therapy  Lavoris Sparling P Darica Goren PT 03/02/2020, 7:35 AM

## 2020-03-02 NOTE — Progress Notes (Signed)
Occupational Therapy Session Note  Patient Details  Name: Timothy Ware MRN: [id number] Date of Birth: 1934/06/05  Today's Date: 03/02/2020 OT Individual Time:  -  1335-1435  Total Treatment Minutes: 60 min   Short Term Goals: Week 1:  OT Short Term Goal 1 (Week 1): Pt will complete toileting with min assist OT Short Term Goal 2 (Week 1): Pt will complete toilet transfer with CGA OT Short Term Goal 3 (Week 1): Pt will complete LB dressing with min assist OT Short Term Goal 4 (Week 1): Pt will complete LB bathing with min assist      Skilled Therapeutic Interventions/Progress Updates:    Pt sitting semifowler in bed with dtr present for beginning of session.  Pt with no c/o pain, reporting fatigue from earlier therapy sessions but agreeable to OT session.  Pt completed supine to sit with supervision.  Pt donned shoes sitting EOB requiring mod assist.  Pt completed squat pivot transfer EOB to w/c with min assist and VCs for safe positioning of BUE and BLE.  Pt propelled to sink requiring VCs to elevate RLE to prevent injury.  Pt completed UB dressing with supervision, UB bathing with setup, and oral hygiene and grooming including combing hair and shaving with setup.    Pt requesting to use bathroom therefore pt self propelled to bathroom utilizing right leg rest to support extremity and requiring mod assist to safely approach 3 in 1 drop arm commode over toilet.  Pt completed squat pivot transfer with min assist using grab bars with step by step VCs for placement and body mechanics.  Pt had continent episode of bowel with distant supervision.  Toileting completed with max assist with pt completing chair pushup and OT assisting with clothing mgt and pericare.  Squat pivot to w/c with min assist.    Pt returned to sink utilizing compensatory technique to lift RLE using LLE.  Pt completed LB dressing with max assist to pull down past hips in standing due to pt needing BUE to maintain standing  position.  Pt bathed with same assist level in standing.  OT noted redness on skin around rectum therefore barrier cream applied.  Educated pt regarding importance of pressure relief and techniques/recommendations to protect skin integrity on buttocks due to impaired sensation and recent prolonged sitting. Squat pivot to EOB with min assist and sit to supine with min assist.  Pt positioned on right side with pillows to support in neutral position for pressure relief.  Call bell in reach, bed alarm on.  Therapy Documentation Precautions:  Precautions Precautions: Fall,Back Precaution Comments: General post-op back precautions Restrictions Weight Bearing Restrictions: No   Therapy/Group: Individual Therapy  Amie Critchley 03/02/2020, 4:23 PM

## 2020-03-02 NOTE — Progress Notes (Signed)
Physical Therapy Session Note  Patient Details  Name: Timothy Ware MRN: [id number] Date of Birth: 1934/09/12  Today's Date: 03/02/2020 PT Individual Time: 0905-1005 and 6389-3734 PT Individual Time Calculation (min): 60 min   Short Term Goals: Week 1:  PT Short Term Goal 1 (Week 1): Pt will complete bed mobility with minA PT Short Term Goal 2 (Week 1): Pt will perform bed<>chair with minA and LRAD PT Short Term Goal 3 (Week 1): Pt will ambulate 47ft with minA and LRAD PT Short Term Goal 4 (Week 1): Pt will initiate stair training  Skilled Therapeutic Interventions/Progress Updates:     Session 1: Patient in bed upon PT arrival. Patient alert and agreeable to PT session. Patient reported 5/10 back pain during session, RN made aware. PT provided repositioning, rest breaks, and distraction as pain interventions throughout session.   Orthostatic Vitals: Supine: BP 138/59, HR 65 (asymptomatic) Sitting: BP 129/68, HR 70 (asymptomatic) Standing: BP 115/80, HR 96 (asymptomatic)  Therapeutic Activity: Bed Mobility: Reviewed spinal precautions prior to mobility, recalled 0/3 precautions, educated on spinal precautions and safe technique with mobility to maintain precautions. Patient performed supine to sit with min A for trunk and lower extremity management. Provided verbal cues for log roll technique and setting bottom elbow to push up to sitting. Transfers: Patient performed sit to/from stand x2 from elevated bed with min A and increased time due to back pain and decreased lower extremity motor control. Provided verbal cues for scooting forward, hand placement on RW, foot placement, forward weight shift, and increased quad and gluteal activation for lower extremity and trunk elongation to stand. He performed stand pivot using RW with mod A and facilitation for weight shifting to step, poor foot clearance with patient shimmying feet instead of lifting except R foot x1. Provided cues for weight  shift, sequencing, and management of RW. He performed sit to/from stand x2 in the [place] to allow PT to apply barrier cream due to patient reporting feeling raw from peri-care, to improve sitting tolerance in w/c. Performed standing with CGA and total A for lower body clothing management. No skin breakdown noted on observation.  Neuromuscular Re-ed: Patient performed the following bed level lower extremity motor control exercises for ability to perform exercises independently outside of therapy: Supine Heel Slide - 1 x daily - 7 x weekly - 2 sets - 10 reps Supine Ankle Pumps - 3 x daily - 7 x weekly - 3 sets - 10 reps Supine Quadricep Sets - 1 x daily - 7 x weekly - 2 sets - 10 reps - 5 sec hold Active Straight Leg Raise with Quad Set - 1 x daily - 7 x weekly - 2 sets - 10 reps  Patient in w/c in the room at end of session with breaks locked, chair alarm set, and all needs within reach. Set goal to sit in the w/c for 1 hour, until next therapy session, if pain tolerable to increased sitting tolerance.   Session 2: Patient in bed upon PT arrival. Patient alert and agreeable to PT session. Patient reported 6/10 back pain during session, RN made aware. PT provided repositioning, rest breaks, and distraction as pain interventions throughout session. Also, reported increased fatigue from previous therapy sessions. Patient stated he did not feel like he had done much therapy today. Discussed activities performed and educated on purpose of inpatient therapies to improved independence with functional mobility and self care alone with exercise, as patient had reference only exercises as "therapeutic." Patient stated  understanding and appreciated education.   Therapeutic Activity: Bed Mobility: Patient performed supine to/from sit with CGA-min A. Provided verbal cues for log roll technique and reduced momentum to achieve mobility to protect his back/spine. Patient sat EOB asking about recovery time and  pathology. Provided education on both topics and directed focus on daily progress rather than comparison to "other patients."  Transfers: Patient performed sit to/from stand x1 with bed slightly elevated, as above. Performed side-stepping x3 with cues for management of RW, sequencing, weight shift, and increased foot clearance when stepping.  Provided handout during session for bed level exercises performed in previous session.  Patient in bed at end of session with breaks locked, bed alarm set, and all needs within reach.    Therapy Documentation Precautions:  Precautions Precautions: Fall,Back Precaution Comments: General post-op back precautions Restrictions Weight Bearing Restrictions: No   Therapy/Group: Individual Therapy  Costantino Kohlbeck L Rodrigues [place] PT, DPT  03/02/2020, 12:20 PM

## 2020-03-02 NOTE — Progress Notes (Signed)
[name] PHYSICAL MEDICINE & REHABILITATION PROGRESS NOTE  Subjective/Complaints: Patient seen laying in bed this morning.  He states he slept well overnight.  He states he is exhausted from therapies yesterday and just had a session this morning.  He is worried that he will not progress in the anticipated 2-week timeframe prior to discharge.  Educated and reassured patient.  ROS: Denies CP, SOB, N/V/D  Objective: Vital Signs: Blood pressure 128/65, pulse 70, temperature 97.9 F (36.6 C), temperature source Oral, resp. rate 18, height 6\' 1"  (1.854 m), weight 88.1 kg, SpO2 99 %. VAS LOWER EXTREMITY VENOUS (DVT)  Result Date: 03/01/2020  Lower Venous DVT Study Indications: Swelling, and Edema.  Comparison Study: no prior Performing Technologist: 14/10/2019 RVS  Examination Guidelines: A complete evaluation includes B-mode imaging, spectral Doppler, color Doppler, and power Doppler as needed of all accessible portions of each vessel. Bilateral testing is considered an integral part of a complete examination. Limited examinations for reoccurring indications may be performed as noted. The reflux portion of the exam is performed with the patient in reverse Trendelenburg.  +---------+---------------+---------+-----------+----------+--------------+ RIGHT    CompressibilityPhasicitySpontaneityPropertiesThrombus Aging +---------+---------------+---------+-----------+----------+--------------+ CFV      Full           Yes      Yes                                 +---------+---------------+---------+-----------+----------+--------------+ SFJ      Full                                                        +---------+---------------+---------+-----------+----------+--------------+ FV Prox  Full                                                        +---------+---------------+---------+-----------+----------+--------------+ FV Mid   Full                                                         +---------+---------------+---------+-----------+----------+--------------+ FV DistalFull                                                        +---------+---------------+---------+-----------+----------+--------------+ PFV      Full                                                        +---------+---------------+---------+-----------+----------+--------------+ POP      Full           Yes      Yes                                 +---------+---------------+---------+-----------+----------+--------------+  PTV      Full                                                        +---------+---------------+---------+-----------+----------+--------------+ PERO     Full                                                        +---------+---------------+---------+-----------+----------+--------------+   +---------+---------------+---------+-----------+----------+--------------+ LEFT     CompressibilityPhasicitySpontaneityPropertiesThrombus Aging +---------+---------------+---------+-----------+----------+--------------+ CFV      Full           Yes      Yes                                 +---------+---------------+---------+-----------+----------+--------------+ SFJ      Full                                                        +---------+---------------+---------+-----------+----------+--------------+ FV Prox  Full                                                        +---------+---------------+---------+-----------+----------+--------------+ FV Mid   Full                                                        +---------+---------------+---------+-----------+----------+--------------+ FV DistalFull                                                        +---------+---------------+---------+-----------+----------+--------------+ PFV      Full                                                         +---------+---------------+---------+-----------+----------+--------------+ POP      Full           Yes      Yes                                 +---------+---------------+---------+-----------+----------+--------------+ PTV      Full                                                        +---------+---------------+---------+-----------+----------+--------------+  PERO     Full                                                        +---------+---------------+---------+-----------+----------+--------------+     Summary: BILATERAL: - No evidence of deep vein thrombosis seen in the lower extremities, bilaterally. - No evidence of superficial venous thrombosis in the lower extremities, bilaterally. -No evidence of popliteal cyst, bilaterally.   *See table(s) above for measurements and observations. Electronically signed by Lemar Livings MD on 03/01/2020 at 3:14:39 PM.    Final    Recent Labs    02/29/20 0456  WBC 9.3  HGB 13.5  HCT 39.4  PLT 151   Recent Labs    02/29/20 0456  NA 136  K 4.5  CL 101  CO2 22  GLUCOSE 130*  BUN 21  CREATININE 1.15  CALCIUM 8.8*    Intake/Output Summary (Last 24 hours) at 03/02/2020 1018 Last data filed at 03/02/2020 0734 Gross per 24 hour  Intake 840 ml  Output 800 ml  Net 40 ml        Physical Exam: BP 128/65   Pulse 70   Temp 97.9 F (36.6 C) (Oral)   Resp 18   Ht 6\' 1"  (1.854 m)   Wt 88.1 kg   SpO2 99%   BMI 25.62 kg/m  Constitutional: No distress . Vital signs reviewed. HENT: Normocephalic.  Atraumatic. Eyes: EOMI. No discharge. Cardiovascular: No JVD.  RRR. Respiratory: Normal effort.  No stridor.  Bilateral clear to auscultation. GI: Non-distended.  BS +. Skin: Warm and dry.   Back incision with dressing CDI. Psych: Normal mood.  Normal behavior. Musc: No edema in extremities.  No tenderness in extremities. Neurologic: Alert Bilateral upper extremities: 5/5 proximal distal Left lower extremity: Hip flexion, knee  extension 4 --4/5, ankle dorsiflexion 5/5, stable Right lower extremity: Hip flexion, knee extension 4-/5, ankle dorsiflexion 5/5, stable Sensation diminished to light touch right lower extremity >> left lower extremity  Assessment/Plan: 1. Functional deficits which require 3+ hours per day of interdisciplinary therapy in a comprehensive inpatient rehab setting.  Physiatrist is providing close team supervision and 24 hour management of active medical problems listed below.  Physiatrist and rehab team continue to assess barriers to discharge/monitor patient progress toward functional and medical goals   Care Tool:  Bathing    Body parts bathed by patient: Right arm,Left arm,Chest,Abdomen,Front perineal area,Right upper leg,Left upper leg,Left lower leg,Face,Right lower leg         Bathing assist Assist Level: Moderate Assistance - Patient 50 - 74% (for standing balance)     Upper Body Dressing/Undressing Upper body dressing   What is the patient wearing?: Pull over shirt    Upper body assist Assist Level: Set up assist    Lower Body Dressing/Undressing Lower body dressing      What is the patient wearing?: Pants,Incontinence brief     Lower body assist Assist for lower body dressing: Moderate Assistance - Patient 50 - 74%     Toileting Toileting    Toileting assist Assist for toileting: Independent with assistive device Assistive Device Comment: urinal   Transfers Chair/bed transfer  Transfers assist     Chair/bed transfer assist level: Moderate Assistance - Patient 50 - 74%     Locomotion Ambulation   Ambulation assist  Assist level: 2 helpers Assistive device: Parallel bars Max distance: 34ft   Walk 10 feet activity   Assist  Walk 10 feet activity did not occur: Safety/medical concerns        Walk 50 feet activity   Assist Walk 50 feet with 2 turns activity did not occur: Safety/medical concerns         Walk 150 feet  activity   Assist Walk 150 feet activity did not occur: Safety/medical concerns         Walk 10 feet on uneven surface  activity   Assist Walk 10 feet on uneven surfaces activity did not occur: Safety/medical concerns         Wheelchair     Assist Will patient use wheelchair at discharge?: No      Wheelchair assist level: Supervision/Verbal cueing Max wheelchair distance: 221ft    Wheelchair 50 feet with 2 turns activity    Assist        Assist Level: Supervision/Verbal cueing   Wheelchair 150 feet activity     Assist      Assist Level: Supervision/Verbal cueing,Dependent - Patient 0%    Medical Problem List and Plan: 1.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 Status post transpedicular microdiscectomy T10-T11 02/25/2020. No brace required  Continue CIR  Decadron decreased to 4 twice daily on 12/8, gradually wean steroids 2. Antithrombotics: -DVT/anticoagulation:SCDs.   Vascular studies reviewed negative for DVT -antiplatelet therapy: Aspirin 81 mg daily 3.  Postoperative pain:Hydrocodone and Robaxin as needed  Controlled with meds on 12/9  Monitor with increased exertion 4. Mood:Provide emotional support -antipsychotic agents: N/A 5. Neuropsych: This patientiscapable of making decisions on hisown behalf. 6. Skin/Wound Care:Routine skin checks 7. Fluids/Electrolytes/Nutrition:Routine in and outs. 8. Hypertension. Tenormin 25 mg daily, Lotensin 10 mg daily.   Labile, but?  Improving on 12/9, continue to monitor  Monitor the increased mobility 9. CAD with PTCA. Aspirin 81 mg daily. 10. Hyperlipidemia. Lipitor 11.  Hypoalbuminemia  Supplement initiated  12.  Steroid-induced hyperglycemia  Monitor with tapering steroids, continue to monitor  LOS: 3 days A FACE TO FACE EVALUATION WAS PERFORMED  Ondine Gemme [name] 03/02/2020, 10:18 AM

## 2020-03-03 ENCOUNTER — Inpatient Hospital Stay (HOSPITAL_COMMUNITY): Payer: Medicare Other | Admitting: Occupational Therapy

## 2020-03-03 ENCOUNTER — Inpatient Hospital Stay (HOSPITAL_COMMUNITY): Payer: Medicare Other

## 2020-03-03 NOTE — Progress Notes (Signed)
Physical Therapy Session Note  Patient Details  Name: Timothy Ware MRN: [id number] Date of Birth: Dec 19, 1934  Today's Date: 03/03/2020 PT Individual Time: 0800-0900 PT Individual Time Calculation (min): 60 min   Short Term Goals: Week 1:  PT Short Term Goal 1 (Week 1): Pt will complete bed mobility with minA PT Short Term Goal 2 (Week 1): Pt will perform bed<>chair with minA and LRAD PT Short Term Goal 3 (Week 1): Pt will ambulate 24ft with minA and LRAD PT Short Term Goal 4 (Week 1): Pt will initiate stair training  Skilled Therapeutic Interventions/Progress Updates:    Session 1: Patient received supine in bed, agreeable to PT. He reports no pain at beginning of session. Patient able to recall 0/3 back precautions when asked and required Max cuing to recall them accurately. MinA needed for supine > sit in order to maintain appropriate precautions. Patient transferring to wc via squat pivot with MaxA. He was able to propel himself in wc to therapy gym with supervision. ModA squat pivot to therapy mat and ModA to return to supine to maintain back precautions. Patient completing the following exercises with emphasis on appropriate muscle activation and controlled coordination of muscle activation: Heel slides B 3x10, Hip abd supine B 3x10, bridges 3x10 with ModA to maintain R knee alignment. Patient then completing sit <> stands from elevated mat height with MinA/ModA. Patient pulling up on walker despite consistent and Max verbal cuing to push up from therapy mat. Patient also bracing B knees on therapy mat to assist with coming to stand despite multiple verbal cues to shift weight anteriorly to prevent B knees from hitting mat. R knee blocked in standing. Patient educated on slideboard transfers to improve safety and level of independence of transfers. He was able to transfer to wc with MinA, level transfer. Patient requiring MinA to transfer back to bed and CGA for sit > supine. Patient remaining  in bed at end of sessions with bed alarm on, call light within reach.    Session 2: Patient received supine in bed, agreeable to PT. He denies pain at beginning of session. Patient able to recall 2/3 (twisting/bending) back precautions when asked. Despite recalling these back precautions, patient attempting to come to sit edge of bed breaking these precautions. MinA provided from PT in order to ensure precautions were adhered to. Patient transferring to wc via squat pivot with ModA. He remains supervision for wc mobility ~317ft to dayroom. Patient requiring MaxA to stand and MinA to remain standing (locking out B knee in stance for improved stability) while PT donned LiteGait harness. Patient ambulating the following distances on treadmill with LiteGait providing bodyweight support: 3'48" for 121ft at 0.75mph, 5'31" for 239ft at 0.29mph, 4'12" for 254ft at 0.29mph. B pelvic bungees applied to assist with proper B hip extension throughout gait cycle. Fluctuating MaxA- CGA to advance R LE. Despite bodyweight support, patient often maintaining crouched posture with rarely full B knee extension in stance. Patient requiring consistent verbal cuing for further R knee flexion through swing phase. Patient fluctuating between scissoring/NBOS and WBOS through gait. Patient requesting to return to room to use the bathroom. Stedy used for toilet transfer due to patient fatigue. NT present to take over care.  Therapy Documentation Precautions:  Precautions Precautions: Fall,Back Precaution Comments: General post-op back precautions Restrictions Weight Bearing Restrictions: No    Therapy/Group: Individual Therapy  Elizebeth Koller, PT, DPT, CBIS  03/03/2020, 7:41 AM

## 2020-03-03 NOTE — Progress Notes (Signed)
[place] PHYSICAL MEDICINE & REHABILITATION PROGRESS NOTE  Subjective/Complaints: Patient seen laying in bed this morning.  He states he slept well overnight.  He states he is exhausted from therapies yesterday and just had a session this morning.  He is worried that he will not progress in the anticipated 2-week timeframe prior to discharge.  Educated and reassured patient.  ROS: Denies CP, SOB, N/V/D  Objective: Vital Signs: Blood pressure (!) 155/61, pulse 66, temperature 98 F (36.7 C), resp. rate 18, height 6\' 1"  (1.854 m), weight 88.1 kg, SpO2 98 %. No results found. No results for input(s): WBC, HGB, HCT, PLT in the last 72 hours. No results for input(s): NA, K, CL, CO2, GLUCOSE, BUN, CREATININE, CALCIUM in the last 72 hours.  Intake/Output Summary (Last 24 hours) at 03/03/2020 1011 Last data filed at 03/03/2020 0800 Gross per 24 hour  Intake 960 ml  Output 800 ml  Net 160 ml        Physical Exam: BP (!) 155/61   Pulse 66   Temp 98 F (36.7 C)   Resp 18   Ht 6\' 1"  (1.854 m)   Wt 88.1 kg   SpO2 98%   BMI 25.62 kg/m  Constitutional: No distress . Vital signs reviewed. HENT: Normocephalic.  Atraumatic. Eyes: EOMI. No discharge. Cardiovascular: No JVD.  RRR. Respiratory: Normal effort.  No stridor.  Bilateral clear to auscultation. GI: Non-distended.  BS +. Skin: Warm and dry.   Back incision with dressing CDI. Psych: Normal mood.  Normal behavior. Musc: No edema in extremities.  No tenderness in extremities. Neurologic: Alert Bilateral upper extremities: 5/5 proximal distal Left lower extremity: Hip flexion, knee extension 4 --4/5, ankle dorsiflexion 5/5, stable Right lower extremity: Hip flexion, knee extension 4-/5, ankle dorsiflexion 5/5, stable Sensation diminished to light touch right lower extremity >> left lower extremity  Assessment/Plan: 1. Functional deficits which require 3+ hours per day of interdisciplinary therapy in a comprehensive inpatient  rehab setting.  Physiatrist is providing close team supervision and 24 hour management of active medical problems listed below.  Physiatrist and rehab team continue to assess barriers to discharge/monitor patient progress toward functional and medical goals   Care Tool:  Bathing    Body parts bathed by patient: Right arm,Left arm,Chest,Abdomen,Front perineal area,Right upper leg,Left upper leg,Face     Body parts n/a: Right lower leg,Left lower leg   Bathing assist Assist Level: Moderate Assistance - Patient 50 - 74%     Upper Body Dressing/Undressing Upper body dressing   What is the patient wearing?: Pull over shirt    Upper body assist Assist Level: Supervision/Verbal cueing    Lower Body Dressing/Undressing Lower body dressing      What is the patient wearing?: Pants     Lower body assist Assist for lower body dressing: Moderate Assistance - Patient 50 - 74%     Toileting Toileting    Toileting assist Assist for toileting: Independent with assistive device Assistive Device Comment:  (urinal)   Transfers Chair/bed transfer  Transfers assist     Chair/bed transfer assist level: Moderate Assistance - Patient 50 - 74%     Locomotion Ambulation   Ambulation assist      Assist level: 2 helpers Assistive device: Parallel bars Max distance: 1ft   Walk 10 feet activity   Assist  Walk 10 feet activity did not occur: Safety/medical concerns        Walk 50 feet activity   Assist Walk 50 feet with  2 turns activity did not occur: Safety/medical concerns         Walk 150 feet activity   Assist Walk 150 feet activity did not occur: Safety/medical concerns         Walk 10 feet on uneven surface  activity   Assist Walk 10 feet on uneven surfaces activity did not occur: Safety/medical concerns         Wheelchair     Assist Will patient use wheelchair at discharge?: No      Wheelchair assist level: Supervision/Verbal  cueing Max wheelchair distance: 227ft    Wheelchair 50 feet with 2 turns activity    Assist        Assist Level: Supervision/Verbal cueing   Wheelchair 150 feet activity     Assist      Assist Level: Supervision/Verbal cueing,Dependent - Patient 0%    Medical Problem List and Plan: 1.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 Status post transpedicular microdiscectomy T10-T11 02/25/2020. No brace required  Continue CIR  Decadron decreased to 4 twice daily on 12/8, gradually wean steroids 2. Antithrombotics: -DVT/anticoagulation:SCDs.   Vascular studies reviewed negative for DVT -antiplatelet therapy: Aspirin 81 mg daily 3.  Postoperative pain:Hydrocodone and Robaxin as needed  Controlled with meds 12/10, but has been labile.   Monitor with increased exertion 4. Mood:Provide emotional support -antipsychotic agents: N/A 5. Neuropsych: This patientiscapable of making decisions on hisown behalf. 6. Skin/Wound Care:Routine skin checks 7. Fluids/Electrolytes/Nutrition:Routine in and outs. 8. Hypertension. Tenormin 25 mg daily, Lotensin 10 mg daily.   Elevated 12/10, but has been labile- continue to monitor.   Monitor the increased mobility 9. CAD with PTCA. Aspirin 81 mg daily. 10. Hyperlipidemia. Lipitor 11.  Hypoalbuminemia  Supplement initiated  12.  Steroid-induced hyperglycemia  Monitor with tapering steroids  CBG 130 12/7- continue to monitor with weekly labs  LOS: 4 days A FACE TO FACE EVALUATION WAS PERFORMED  Clint Bolder P Jaiana Sheffer 03/03/2020, 10:11 AM

## 2020-03-03 NOTE — Progress Notes (Signed)
Occupational Therapy Session Note  Patient Details  Name: Charlis Harner MRN: [id number] Date of Birth: [birth year]  Today's Date: 03/03/2020 OT Individual Time: 1000-1100 OT Individual Time Calculation (min): 60 min    Short Term Goals: Week 1:  OT Short Term Goal 1 (Week 1): Pt will complete toileting with min assist OT Short Term Goal 2 (Week 1): Pt will complete toilet transfer with CGA OT Short Term Goal 3 (Week 1): Pt will complete LB dressing with min assist OT Short Term Goal 4 (Week 1): Pt will complete LB bathing with min assist  Skilled Therapeutic Interventions/Progress Updates:    Pt supine in bed, no c/o pain, reporting "Im not really interested in taking a bath, I just want to work on my legs".  Pt completed BLE leg strengthening AROM and isometric therex to facilitate increased strength and independence during functional mobility.  Pt completed 2 x 10 reps of forward straight leg raises, heel slides, and ankle pumps, and 2 sets of 20 quad isometric contractions.  Pt required intermittent VCs for body mechanics and slowing of pace of movement especially eccentric movement.  Pt completed supine to sit using bed rail with supervision and poor carryover of body mechanics despite OT step by step VCs.  Pt completed sit<>stand x 3 trials and static standing at RW for 3 minute intervals with intermittent LUE release and reaching requiring min assist and guarding of RLE to prevent knee buckling.  Sit<>stand and static balance training completed to prepare pt for increased independence with self care tasks.  Therapy Documentation Precautions:  Precautions Precautions: Fall,Back Precaution Comments: General post-op back precautions Restrictions Weight Bearing Restrictions: No   Therapy/Group: Individual Therapy  Amie Critchley 03/03/2020, 12:44 PM

## 2020-03-04 ENCOUNTER — Inpatient Hospital Stay (HOSPITAL_COMMUNITY): Payer: Medicare Other

## 2020-03-04 ENCOUNTER — Inpatient Hospital Stay (HOSPITAL_COMMUNITY): Payer: Medicare Other | Admitting: Physical Therapy

## 2020-03-04 NOTE — Progress Notes (Signed)
Physical Therapy Session Note  Patient Details  Name: Timothy Ware MRN: [id number] Date of Birth: Apr 03, 1934  Today's Date: 03/04/2020 PT Individual Time: 5366-4403 and 4742-5956 PT Individual Time Calculation (min): 56 min  And 44 min   Short Term Goals: Week 1:  PT Short Term Goal 1 (Week 1): Pt will complete bed mobility with minA PT Short Term Goal 2 (Week 1): Pt will perform bed<>chair with minA and LRAD PT Short Term Goal 3 (Week 1): Pt will ambulate 41ft with minA and LRAD PT Short Term Goal 4 (Week 1): Pt will initiate stair training  Skilled Therapeutic Interventions/Progress Updates:    Session 1: Pt received sitting in w/c and agreeable to therapy session though reporting fatigue from having OT session immediately prior. Pt states that his legs are "shot" (meaning exhausted) from standing but agreeable to further attempts. B UE w/c propulsion ~283ft to day room with supervision - noted w/c veering R with pt automatically correcting without cuing. Pt demonstrates most significant weakness in R LE hip flexors and hamstrings then quads. Sit>stand w/c>B UE support on litegait with mod assist for lifting to stand and balance due to posterior lean, blocking R knee and facilitating hip/knee extension - pt able to initiate R LE extension with cuing but due to lack of sensation is unable to tell when he is activating those muscles. Due to limited endurance with standing had to sit back down to don harness then return to standing 2 additional times prior to getting harness completely donned with +2 assist for safety. Gait training ~70ft, ~19ft overground while in litegait harness for partial BWS with mod/max assist assist of 1 and +2 assist to manage litegait - demos poor motor planning with pt partially squatting down hanging in sling with excessive hip flexion and posterior lean - requires max cuing for correction and therapist applied black bungee cords to bilateral hips to promote increased  anterior weight shift and hip extension - max sequential cuing for LE stepping with pt requiring max assist to facilitate R/L weight shift onto stance limb and total assist for R LE advancement during swing due to lack of hip/knee flexor activation - demos narrow BOS with significantly impaired BLE coordination. Standing in litegait harness for light support while working on standing balance without UE support - mirror feedback provided with emphasis on B LE hip extension and anterior weight shift to prevent posterior lean (without compensating via pulling himself forward on litegait handles - may be in habit of doing this from the stedy) and to achieve midline, able to sustain ~10seconds with mod assist prior to progressively worsening posterior lean - progressed to pre-gait training via L LE forward/backwrds stepping for R LE WBing and cuing/facilitation for R hip/knee extension with pt demonstrating good quad contraction but unable to sustain while moving L LE. Doffed harness and transported back to room with pt requesting to return to bed and rest prior to next session. R squat pivot w/c>EOB with min assist for lifting/pivoting hips - assist with R LE positioning. Sit>supine with min assist and max cuing for reverse logroll technique with pt unable to recall needing to do this for spine precautions. Pt left supine in bed with needs in reach and bed alarm on.  Session 2: Pt received supine in bed and agreeable to therapy session.  Performed the following supine LE exercises:  - R LE heel slides with min active assist due to pt reporting strong pulling in R groin otherwise 2x12 reps  -  L LE level 1 theraband resisted heel slides 2x12 reps - hooklying B LE hip adduction 5 second pillow squeezes with return to neutral position working on primarily R hip motor control x15 reps - initially pt's R knee would either fall out to the side or fall in resting on L LE but improved with increased repetitions  -  hooklying alternating hip adduction 5second pillow squeeze with hip abduction against level 1 theraband working on motor control and more isometric contractions - has significant difficulty controlling this movement initially requiring min assist for R LE but improving  x15 reps - hooklying bridging 2x12 reps - initially pt would contract adductors pulling knees together while lifting hips so therapist provided min manual resistance for B LE hip abduction to achieve proper hip alignment with pt demonstrating good improvement  Supine>sitting R EOB, HOB flat but using bedrail, with supervision and max sequential cuing for logroll technique. Sitting EOB performed alternating seated marches x10 reps with pt demonstrating ability to activate hip flexors and lift B LEs though does have slight compensation with posterior trunk lean. Sit>stand elevated EOB>RW 2x, cuing to push up from bed, with mod assist for lifting and balance due to posterior lean - during 1st stand pt pushed backs of legs strongly against bed to assist with coming up but significant improvement during 2nd stand after cuing to scoot closer to EOB and cuing for hip extension while coming to stand. While standing with RW progressed to alternate hand raises off RW to lifting both hands off RW simultaneously, min assist for balance - demos gradual progression into slight B LE hip/knee flexion but able to correct with min cuing and without UE support - tolerates standing ~22minute each trial. Sit>supine via reverse logroll technique with min assist for B LE management into bed. Pt left supine in bed, HOB elevated, needs in reach and bed alarm on.  Therapy Documentation Precautions:  Precautions Precautions: Fall,Back Precaution Comments: General post-op back precautions Restrictions Weight Bearing Restrictions: No  Pain:   Session 1: Denies pain during session.  Session 2: Denies pain during session.   Therapy/Group: Individual  Therapy  Ginny Forth , PT, DPT, CSRS  03/04/2020, 12:25 PM

## 2020-03-04 NOTE — Progress Notes (Signed)
[place] PHYSICAL MEDICINE & REHABILITATION PROGRESS NOTE  Subjective/Complaints: No complaints this morning. Denies pain, constipation, insomnia.   ROS: Denies CP, SOB, N/V/D  Objective: Vital Signs: Blood pressure 130/63, pulse (!) 56, temperature 98.5 F (36.9 C), resp. rate 16, height 6\' 1"  (1.854 m), weight 88.1 kg, SpO2 98 %. No results found. No results for input(s): WBC, HGB, HCT, PLT in the last 72 hours. No results for input(s): NA, K, CL, CO2, GLUCOSE, BUN, CREATININE, CALCIUM in the last 72 hours.  Intake/Output Summary (Last 24 hours) at 03/04/2020 0846 Last data filed at 03/04/2020 0400 Gross per 24 hour  Intake 460 ml  Output 700 ml  Net -240 ml     Physical Exam: BP 130/63 (BP Location: Right Arm)   Pulse (!) 56   Temp 98.5 F (36.9 C)   Resp 16   Ht 6\' 1"  (1.854 m)   Wt 88.1 kg   SpO2 98%   BMI 25.62 kg/m   General: Alert and oriented x 3, No apparent distress HEENT: Head is normocephalic, atraumatic, PERRLA, EOMI, sclera anicteric, oral mucosa pink and moist, dentition intact, ext ear canals clear,  Neck: Supple without JVD or lymphadenopathy Heart: Bradycardia. No murmurs rubs or gallops Chest: CTA bilaterally without wheezes, rales, or rhonchi; no distress Abdomen: Soft, non-tender, non-distended, bowel sounds positive. Extremities: No clubbing, cyanosis, or edema. Pulses are 2+ Skin:  Back incision with dressing CDI. Psych: Normal mood.  Normal behavior. Musc: No edema in extremities.  No tenderness in extremities. Neurologic: Alert Bilateral upper extremities: 5/5 proximal distal Left lower extremity: Hip flexion, knee extension 4 --4/5, ankle dorsiflexion 5/5, stable Right lower extremity: Hip flexion, knee extension 4-/5, ankle dorsiflexion 5/5, stable Sensation diminished to light touch right lower extremity >> left lower extremity  Assessment/Plan: 1. Functional deficits which require 3+ hours per day of interdisciplinary therapy in a  comprehensive inpatient rehab setting.  Physiatrist is providing close team supervision and 24 hour management of active medical problems listed below.  Physiatrist and rehab team continue to assess barriers to discharge/monitor patient progress toward functional and medical goals   Care Tool:  Bathing    Body parts bathed by patient: Right arm,Left arm,Chest,Abdomen,Front perineal area,Right upper leg,Left upper leg,Face     Body parts n/a: Right lower leg,Left lower leg   Bathing assist Assist Level: Moderate Assistance - Patient 50 - 74%     Upper Body Dressing/Undressing Upper body dressing   What is the patient wearing?: Pull over shirt    Upper body assist Assist Level: Supervision/Verbal cueing    Lower Body Dressing/Undressing Lower body dressing      What is the patient wearing?: Pants     Lower body assist Assist for lower body dressing: Moderate Assistance - Patient 50 - 74%     Toileting Toileting    Toileting assist Assist for toileting: Independent with assistive device Assistive Device Comment:  (urinal)   Transfers Chair/bed transfer  Transfers assist     Chair/bed transfer assist level: Moderate Assistance - Patient 50 - 74%     Locomotion Ambulation   Ambulation assist      Assist level: 2 helpers Assistive device: Parallel bars Max distance: 75ft   Walk 10 feet activity   Assist  Walk 10 feet activity did not occur: Safety/medical concerns        Walk 50 feet activity   Assist Walk 50 feet with 2 turns activity did not occur: Safety/medical concerns  Walk 150 feet activity   Assist Walk 150 feet activity did not occur: Safety/medical concerns         Walk 10 feet on uneven surface  activity   Assist Walk 10 feet on uneven surfaces activity did not occur: Safety/medical concerns         Wheelchair     Assist Will patient use wheelchair at discharge?: Yes Type of Wheelchair: Manual     Wheelchair assist level: Supervision/Verbal cueing Max wheelchair distance: 221ft    Wheelchair 50 feet with 2 turns activity    Assist        Assist Level: Supervision/Verbal cueing   Wheelchair 150 feet activity     Assist      Assist Level: Supervision/Verbal cueing,Dependent - Patient 0%    Medical Problem List and Plan: 1.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 Status post transpedicular microdiscectomy T10-T11 02/25/2020. No brace required  Continue CIR  Decadron decreased to 4 twice daily on 12/8, gradually wean steroids 2. Antithrombotics: -DVT/anticoagulation:SCDs.  Vascular studies reviewed negative for DVT -antiplatelet therapy: Aspirin 81 mg daily 3.  Postoperative pain:Robaxin as needed  Controlled with meds 12/11, discontinue Hydrocodone.   Monitor with increased exertion 4. Mood:Provide emotional support -antipsychotic agents: N/A 5. Neuropsych: This patientiscapable of making decisions on hisown behalf. 6. Skin/Wound Care:Routine skin checks 7. Fluids/Electrolytes/Nutrition:Routine in and outs. 8. Hypertension. Tenormin 25 mg daily, Lotensin 10 mg daily.   Well controlled 12/11- monitor TID.   Monitor the increased mobility 9. CAD with PTCA. Aspirin 81 mg daily. 10. Hyperlipidemia. Lipitor 11.  Hypoalbuminemia  Supplement initiated  12.  Steroid-induced hyperglycemia  Monitor with tapering steroids  CBG 130 12/7- continue to monitor with weekly labs 13. Bradycardia: Does f/u with cardiology, has an appointment in January. Does have fatigue.   LOS: 5 days A FACE TO FACE EVALUATION WAS PERFORMED  Drema Pry Janely Gullickson 03/04/2020, 8:46 AM

## 2020-03-04 NOTE — Progress Notes (Signed)
Occupational Therapy Session Note  Patient Details  Name: Timothy Ware MRN: [id number] Date of Birth: 1935/02/18  Today's Date: 03/04/2020 OT Individual Time: 5009-3818 and 1350-1430 OT Individual Time Calculation (min): 45 min and 40 min    Short Term Goals: Week 1:  OT Short Term Goal 1 (Week 1): Pt will complete toileting with min assist OT Short Term Goal 2 (Week 1): Pt will complete toilet transfer with CGA OT Short Term Goal 3 (Week 1): Pt will complete LB dressing with min assist OT Short Term Goal 4 (Week 1): Pt will complete LB bathing with min assist  Skilled Therapeutic Interventions/Progress Updates:    Session 1: OT session focused on functional transfers, standing balance, activity tolerance, and strength. Pt received supine in bed, declining B&D, however agreeable for therapy. Pt unable to recall back precautions, completing supine>sit with min A and cues for adherence to precautions. Completed squat pivot transfer bed>w/c and w/c<>mat table with mod assist. Completed sit<>stand x2 from elevated mat with heavy mod A. Pt remained in standing ~2 minutes each time while also completed lateral weight shift L<>R x20 reps and OT supporting R knee to prevent buckling. In sitting, pt completed hip flexion and adduction/abduction exercises 3x 20 reps focusing on controlled movement, rather than using momentum. Pt appeared very motivated in session. Upon returning to room, he was left sitting in his w/c with wife present.   Session 2: OT session focused on functional transfers, standing balance, and BLE strengthening required for transfers and self-care tasks. Pt received in bed, completing supine>sit with supervision and squat pivot transfer to w/c with min A. Pt propelled self to gym then completed squat pivot transfer w/c<>mat table with min A. While sitting EOM, pt completed alternating ball kicks 3x 20 reps with 1# ankle weight. Completed 3x 10 reps of 5 second LE extensions against  gravity. Completed sit<>stand x3 with min-mod assist, remaining in standing with eyes closed x10 seconds to challenge balance. At end of session, pt left sitting in w/c with all needs in reach, awaiting next therapy session.  Therapy Documentation Precautions:  Precautions Precautions: Fall,Back Precaution Comments: General post-op back precautions Restrictions Weight Bearing Restrictions: No General:   Vital Signs:  Pain:   ADL: ADL Grooming: Setup (sitting at w/c) Where Assessed-Grooming: Sitting at sink Upper Body Bathing: Setup Where Assessed-Upper Body Bathing: Sitting at sink Lower Body Bathing: Moderate assistance Where Assessed-Lower Body Bathing: Sitting at sink,Standing at sink Upper Body Dressing: Setup Where Assessed-Upper Body Dressing: Sitting at sink Lower Body Dressing: Moderate assistance Where Assessed-Lower Body Dressing: Sitting at sink,Standing at sink Toileting: Maximal assistance Where Assessed-Toileting: Other (Comment) (3 in 1 commode over toilet) Toilet Transfer: Moderate assistance Toilet Transfer Method: Squat pivot Toilet Transfer Equipment: Drop arm bedside commode Vision   Perception    Praxis   Exercises:   Other Treatments:     Therapy/Group: Individual Therapy  Daneil Dan 03/04/2020, 12:07 PM

## 2020-03-05 NOTE — Progress Notes (Signed)
[place] PHYSICAL MEDICINE & REHABILITATION PROGRESS NOTE  Subjective/Complaints: No complaints this morning. Denies pain, constipation, insomnia.   ROS: Denies CP, SOB, N/V/D  Objective: Vital Signs: Blood pressure 135/63, pulse 70, temperature 97.9 F (36.6 C), resp. rate 17, height 6\' 1"  (1.854 m), weight 88.1 kg, SpO2 99 %. No results found. No results for input(s): WBC, HGB, HCT, PLT in the last 72 hours. No results for input(s): NA, K, CL, CO2, GLUCOSE, BUN, CREATININE, CALCIUM in the last 72 hours.  Intake/Output Summary (Last 24 hours) at 03/05/2020 1510 Last data filed at 03/05/2020 1300 Gross per 24 hour  Intake 1040 ml  Output 950 ml  Net 90 ml     Physical Exam: BP 135/63   Pulse 70   Temp 97.9 F (36.6 C)   Resp 17   Ht 6\' 1"  (1.854 m)   Wt 88.1 kg   SpO2 99%   BMI 25.62 kg/m   Gen: no distress, normal appearing HEENT: oral mucosa pink and moist, NCAT Cardio: Reg rate Chest: normal effort, normal rate of breathing Abd: soft, non-distended Ext: no edema Skin:  Back incision with dressing CDI. Psych: Normal mood.  Normal behavior. Musc: No edema in extremities.  No tenderness in extremities. Neurologic: Alert Bilateral upper extremities: 5/5 proximal distal Left lower extremity: Hip flexion, knee extension 4 --4/5, ankle dorsiflexion 5/5, stable Right lower extremity: Hip flexion, knee extension 4-/5, ankle dorsiflexion 5/5, stable Sensation diminished to light touch right lower extremity >> left lower extremity  Assessment/Plan: 1. Functional deficits which require 3+ hours per day of interdisciplinary therapy in a comprehensive inpatient rehab setting.  Physiatrist is providing close team supervision and 24 hour management of active medical problems listed below.  Physiatrist and rehab team continue to assess barriers to discharge/monitor patient progress toward functional and medical goals   Care Tool:  Bathing    Body parts bathed by  patient: Right arm,Left arm,Chest,Abdomen,Front perineal area,Right upper leg,Left upper leg,Face     Body parts n/a: Right lower leg,Left lower leg   Bathing assist Assist Level: Moderate Assistance - Patient 50 - 74%     Upper Body Dressing/Undressing Upper body dressing   What is the patient wearing?: Pull over shirt    Upper body assist Assist Level: Supervision/Verbal cueing    Lower Body Dressing/Undressing Lower body dressing      What is the patient wearing?: Pants     Lower body assist Assist for lower body dressing: Moderate Assistance - Patient 50 - 74%     Toileting Toileting    Toileting assist Assist for toileting: Independent with assistive device Assistive Device Comment:  (urinal)   Transfers Chair/bed transfer  Transfers assist     Chair/bed transfer assist level: Minimal Assistance - Patient > 75% (R squat pivot)     Locomotion Ambulation   Ambulation assist      Assist level: Dependent - Patient 0% Assistive device: Lite Gait Max distance: 58ft   Walk 10 feet activity   Assist  Walk 10 feet activity did not occur: Safety/medical concerns  Assist level: Dependent - Patient 0% Assistive device: Lite Gait   Walk 50 feet activity   Assist Walk 50 feet with 2 turns activity did not occur: Safety/medical concerns         Walk 150 feet activity   Assist Walk 150 feet activity did not occur: Safety/medical concerns         Walk 10 feet on uneven surface  activity  Assist Walk 10 feet on uneven surfaces activity did not occur: Safety/medical concerns         Wheelchair     Assist Will patient use wheelchair at discharge?: Yes Type of Wheelchair: Manual    Wheelchair assist level: Supervision/Verbal cueing Max wheelchair distance: 216ft    Wheelchair 50 feet with 2 turns activity    Assist        Assist Level: Supervision/Verbal cueing   Wheelchair 150 feet activity     Assist       Assist Level: Supervision/Verbal cueing,Dependent - Patient 0%    Medical Problem List and Plan: 1.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 Status post transpedicular microdiscectomy T10-T11 02/25/2020. No brace required  Continue CIR  Decadron decreased to 4 twice daily on 12/8, gradually wean steroids 2. Antithrombotics: -DVT/anticoagulation:SCDs.  Vascular studies reviewed negative for DVT -antiplatelet therapy: Aspirin 81 mg daily 3.  Postoperative pain:Robaxin as needed  Controlled with meds 12/11, discontinue Hydrocodone.   12/12: pain is well controlled, d/c robaxin  Monitor with increased exertion 4. Mood:Provide emotional support -antipsychotic agents: N/A 5. Neuropsych: This patientiscapable of making decisions on hisown behalf. 6. Skin/Wound Care:Routine skin checks 7. Fluids/Electrolytes/Nutrition:Routine in and outs. 8. Hypertension.   Well controlled 12/12: continue tenormin 25mg  daily and lotensin 10mg  daily 9. CAD with PTCA. Aspirin 81 mg daily. 10. Hyperlipidemia. Lipitor 11.  Hypoalbuminemia  Supplement initiated  12.  Steroid-induced hyperglycemia  Monitor with tapering steroids  CBG 130 12/7- continue to monitor with weekly labs 13. Bradycardia: Does f/u with cardiology, has an appointment in January. Does have fatigue.   12/12: improved, continue to monitor HR  LOS: 6 days A FACE TO FACE EVALUATION WAS PERFORMED  Efton Thomley P Abhiram Criado 03/05/2020, 3:10 PM

## 2020-03-06 ENCOUNTER — Inpatient Hospital Stay (HOSPITAL_COMMUNITY): Payer: Medicare Other

## 2020-03-06 ENCOUNTER — Inpatient Hospital Stay (HOSPITAL_COMMUNITY): Payer: Medicare Other | Admitting: Occupational Therapy

## 2020-03-06 DIAGNOSIS — R7989 Other specified abnormal findings of blood chemistry: Secondary | ICD-10-CM

## 2020-03-06 DIAGNOSIS — R001 Bradycardia, unspecified: Secondary | ICD-10-CM

## 2020-03-06 MED ORDER — DEXAMETHASONE 4 MG PO TABS
4.0000 mg | ORAL_TABLET | Freq: Every day | ORAL | Status: DC
  Administered 2020-03-07 – 2020-03-13 (×7): 4 mg via ORAL
  Filled 2020-03-06 (×9): qty 1

## 2020-03-06 NOTE — Progress Notes (Signed)
[place] PHYSICAL MEDICINE & REHABILITATION PROGRESS NOTE  Subjective/Complaints: Patient seen laying in bed this morning.  He states he slept well overnight.  He states he had a question, but forgot.  He notes improvement in temperature sensation in his right lower extremity.  ROS: Denies CP, SOB, N/V/D  Objective: Vital Signs: Blood pressure 138/84, pulse (!) 58, temperature 98.1 F (36.7 C), temperature source Oral, resp. rate 16, height 6\' 1"  (1.854 m), weight 88.1 kg, SpO2 99 %. No results found. No results for input(s): WBC, HGB, HCT, PLT in the last 72 hours. No results for input(s): NA, K, CL, CO2, GLUCOSE, BUN, CREATININE, CALCIUM in the last 72 hours.  Intake/Output Summary (Last 24 hours) at 03/06/2020 0820 Last data filed at 03/06/2020 0741 Gross per 24 hour  Intake 1000 ml  Output 1300 ml  Net -300 ml     Physical Exam: BP 138/84 (BP Location: Right Arm)    Pulse (!) 58    Temp 98.1 F (36.7 C) (Oral)    Resp 16    Ht 6\' 1"  (1.854 m)    Wt 88.1 kg    SpO2 99%    BMI 25.62 kg/m  Constitutional: No distress . Vital signs reviewed. HENT: Normocephalic.  Atraumatic. Eyes: EOMI. No discharge. Cardiovascular: No JVD.  RRR. Respiratory: Normal effort.  No stridor.  Bilateral clear to auscultation. GI: Non-distended.  BS +. Skin: Warm and dry.   Back incision with dressing CDI. Psych: Normal mood.  Normal behavior. Musc: No edema in extremities.  No tenderness in extremities. Neurologic: Alert Bilateral upper extremities: 5/5 proximal distal Left lower extremity: Hip flexion, knee extension 4 --4/5, ankle dorsiflexion 5/5, stable Right lower extremity: Hip flexion, knee extension 4-/5, ankle dorsiflexion 5/5, stable Sensation diminished to light touch right lower extremity >> left lower extremity, improving  Assessment/Plan: 1. Functional deficits which require 3+ hours per day of interdisciplinary therapy in a comprehensive inpatient rehab setting.  Physiatrist  is providing close team supervision and 24 hour management of active medical problems listed below.  Physiatrist and rehab team continue to assess barriers to discharge/monitor patient progress toward functional and medical goals   Care Tool:  Bathing    Body parts bathed by patient: Right arm,Left arm,Chest,Abdomen,Front perineal area,Right upper leg,Left upper leg,Face     Body parts n/a: Right lower leg,Left lower leg   Bathing assist Assist Level: Moderate Assistance - Patient 50 - 74%     Upper Body Dressing/Undressing Upper body dressing   What is the patient wearing?: Pull over shirt    Upper body assist Assist Level: Supervision/Verbal cueing    Lower Body Dressing/Undressing Lower body dressing      What is the patient wearing?: Pants     Lower body assist Assist for lower body dressing: Moderate Assistance - Patient 50 - 74%     Toileting Toileting    Toileting assist Assist for toileting: Independent with assistive device Assistive Device Comment:  (urinal)   Transfers Chair/bed transfer  Transfers assist     Chair/bed transfer assist level: Minimal Assistance - Patient > 75% (R squat pivot)     Locomotion Ambulation   Ambulation assist      Assist level: Dependent - Patient 0% Assistive device: Lite Gait Max distance: 60ft   Walk 10 feet activity   Assist  Walk 10 feet activity did not occur: Safety/medical concerns  Assist level: Dependent - Patient 0% Assistive device: Lite Gait   Walk 50 feet activity  Assist Walk 50 feet with 2 turns activity did not occur: Safety/medical concerns         Walk 150 feet activity   Assist Walk 150 feet activity did not occur: Safety/medical concerns         Walk 10 feet on uneven surface  activity   Assist Walk 10 feet on uneven surfaces activity did not occur: Safety/medical concerns         Wheelchair     Assist Will patient use wheelchair at discharge?: Yes Type  of Wheelchair: Manual    Wheelchair assist level: Supervision/Verbal cueing Max wheelchair distance: 274ft    Wheelchair 50 feet with 2 turns activity    Assist        Assist Level: Supervision/Verbal cueing   Wheelchair 150 feet activity     Assist      Assist Level: Supervision/Verbal cueing,Dependent - Patient 0%    Medical Problem List and Plan: 1.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 Status post transpedicular microdiscectomy T10-T11 02/25/2020. No brace required  Continue CIR  Decadron decreased to 4 twice daily on 12/8, decreased to daily on 12/13 2. Antithrombotics: -DVT/anticoagulation:SCDs.  Vascular studies reviewed negative for DVT -antiplatelet therapy: Aspirin 81 mg daily 3.  Postoperative pain:  Controlled on 12/13  Monitor with increased exertion 4. Mood:Provide emotional support -antipsychotic agents: N/A 5. Neuropsych: This patientiscapable of making decisions on hisown behalf. 6. Skin/Wound Care:Routine skin checks 7. Fluids/Electrolytes/Nutrition:Routine in and outs. 8. Hypertension.   Continue tenormin 25mg  daily and lotensin 10mg  daily  Controlled on 12/13 9. CAD with PTCA. Aspirin 81 mg daily. 10. Hyperlipidemia. Lipitor 11.  Hypoalbuminemia  Supplement initiated  12.  Steroid-induced hyperglycemia  Monitor with tapering steroids, labs ordered for tomorrow 13. Bradycardia: Does f/u with cardiology, has an appointment in January. Does have fatigue.   Stable on 12/13 14.  Elevated creatinine  Creatinine 1.5 on 12/7, labs ordered for tomorrow  LOS: 7 days A FACE TO FACE EVALUATION WAS PERFORMED  Natassia Guthridge 1/14 03/06/2020, 8:20 AM

## 2020-03-06 NOTE — Progress Notes (Signed)
Physical Therapy Session Note  Patient Details  Name: Timothy Ware MRN: [id number] Date of Birth: Mar 22, 1935  Today's Date: 03/06/2020 PT Individual Time: 0805-0905 and 1300-1330 PT Individual Time Calculation (min): 60 min and 30 min   Short Term Goals: Week 1:  PT Short Term Goal 1 (Week 1): Pt will complete bed mobility with minA PT Short Term Goal 2 (Week 1): Pt will perform bed<>chair with minA and LRAD PT Short Term Goal 3 (Week 1): Pt will ambulate 28ft with minA and LRAD PT Short Term Goal 4 (Week 1): Pt will initiate stair training  Skilled Therapeutic Interventions/Progress Updates:     Session 1: Patient in bed upon PT arrival. Patient alert and agreeable to PT session. Patient denied pain during session.  Therapeutic Activity: Bed Mobility: Patient performed supine to/from sit with supervision and use of bed rail. Provided verbal cues for log roll technique, with poor adherence on return to lying, to maintain spinal precautions. Transfers: Patient performed squat pivot bed<>w/c with CGA-min A fot lifting hips up. Provided cues for hand placement and head hips relationship. He performed sit to/from stand x1 with min A with RW or dependent x2 in the Lite Gait. Provided verbal cues for foot placement, forward weights shift, hip/knee/and trunk extension. He stood >1 min using the RW with CGA for PT done the Lite Gait harness, provided cues for equal weight bearing, and increased knee/hip/trunk extension in standing.   Gait Training:  Patient ambulated 45 feet x2 using Lite Gait with partial BWS. Ambulated with assist for R foot advancement x3 then able to independently advance with cues for R hamstring activation in pre-swing, also ambulated with decreased R weight shift and L step length, also improved with multimodal cues throughout gait training. Initiated ambulation in good lower extremity and trunk extension, however progresses to crouched posture with fatigue, continues to be  able to correct with verbal and tactile cues at his hips for gluteal activation.   Wheelchair Mobility:  Patient propelled wheelchair >250 feet with supervision-mod I. Provided verbal cues for performing tights turns x1. Provided patient with w/c gloves during session and provided cues for w/c parts management throughout session.  Provided positive reinforcement throughout session and discussed patient's gains in mobility and progression with gait using the Lite Gait during rest breaks throughout session. Reviewed performance of HEP not 2x per day outside of therapies, as tolerance for exercises has been good.  Patient in bed at end of session with breaks locked, bed alarm set, and all needs within reach.   Session 2: Patient in bed with a family member in the room upon PT arrival. Patient alert and agreeable to PT session. Patient denied pain during session.  Discussed patient's progress and goals, patient focused on ambulating at d/c and stated he could progress on his own at d/c if he could walk. Provided education on patient's progress and management of expectations. Discussed differences on therapeutic ambulation versus functional ambulation. Patient agreeable to continue with inpatient rehab to progress towards functional ambulation for short household distances with use of w/c. Heavily reinforced use of w/c at d/c for energy conservation and fall prevention, patient stated understanding. Provided patient's family member with home measurement sheet to determine accessibility with w/c mobility.  Therapeutic Activity: Bed Mobility: Patient performed supine to/from sit with supervision as above, continues to not follow cues for sit to supine, stating "if it works my way why change it." Educated on risks of not following spinal precautions. Patient will need further  reinforcement.  Transfers: Patient performed sit to/from stand x1 with min A and increased time using the RW from a slightly elevated  bed. Provided verbal cues for foot placement, scooting forward, forward weight shift, and hand placement on RW.   Gait Training:  Patient ambulated with the RW 4 steps forwards with mod A with independent advancement of R foot with increased time for initiation and max cues for technique and 4 steps backwards with mod-max A and max A for L foot advancement/placement. Provided multimodal cues for sequencing, weight shifting, increased hip/knee/trunk extension on stance limb, increased R hamstring activation to initiate R foot clearance, responded well to the cue "bend your knee," and cues for trunk elongation and hip extensor activation to reduce crouched posture.  Patient in bed with family member at bedside at end of session with breaks locked, bed alarm set, and all needs within reach.    Therapy Documentation Precautions:  Precautions Precautions: Fall,Back Precaution Comments: General post-op back precautions Restrictions Weight Bearing Restrictions: No   Therapy/Group: Individual Therapy  Race Latour L Catherin Doorn PT, DPT  03/06/2020, 4:38 PM

## 2020-03-06 NOTE — Progress Notes (Signed)
Physical Therapy Weekly Progress Note  Patient Details  Name: Timothy Ware MRN: [id number] Date of Birth: September 21, 1934  Beginning of progress report period: February 29, 2020 End of progress report period: March 06, 2020  Today's Date: 03/06/2020 PT Individual Time: 1000-1100 PT Individual Time Calculation (min): 60 min   Patient has met 2 of 4 short term goals. Pt is making slow but appropriate progress. He is able to complete bed mobility with minA, squat<>pivot transfers with minA (sometimes requires modA at end of sessions depending on fatigue levels), and can propel himself in his w/c >268f with supervision/mod I on level surfaces. He has shown slow progress in pre-gait and gait training due to his significant RLE weakness/sensory/proprioceptive deficits. He has been able to ambulate ~573fwith LiteGait and partial BWS, requiring assist for R foot advancement, foot placement, and postural awareness. Limited carryover into functional gait however, as he has only been able to ambulate ~40f26fith mod/maxA and RW or 8ft10fth modA in // bars.   Due to limited progress and concern for functional gait training, [facility] with determining [facility].   Patient continues to demonstrate the following deficits muscle weakness, decreased cardiorespiratoy endurance, impaired timing and sequencing, unbalanced muscle activation, motor apraxia and decreased motor planning, decreased motor planning and decreased standing balance, decreased postural control and decreased balance strategies and therefore will continue to benefit from skilled PT intervention to increase functional independence with mobility.  Patient progressing toward long term goals.  Continue plan of care.  PT Short Term Goals Week 2:  PT Short Term Goal 1 (Week 2): Pt will complete bed mobility mod I PT Short Term Goal 2 (Week 2): Pt will perform bed<>chair  with CGA and LRAD PT Short Term Goal 3 (Week 2): Pt will ambulate 25ft70fh minA and LRAD  Skilled Therapeutic Interventions/Progress Updates:    Pt greeted supine in bed with daughter at bedside, pt agreeable to PT session. Pt expresses concerns and frustration regarding slow progress. Provided him education regarding spinal cord recovery and encouragement for continued efforts. He was able to don shoes with setupA while supine in bed, via figure-4 technique. Performed supine<>sit with supervision with HOB flat, use of bed rail. Squat<>pivot transfer with min/modA from EOB to w/c with cues needed for setup and monitoring R foot placement. He propelled himself with supervision using BUE's in w/c >200ft 51fallways with good stroke propulsions and efficiency. Performed squat>pivot with min/modA and similar technique as above from w/c to mat table. Performed repeated sit<>stands with modA from raised EOM height with R knee block for both buckling and hyperextension, manual facilitation required for RLE weight shift for R weight bearing. Trials of standing to fatigue for each repetition where he was able to stand for ~1min e83m time. Also provided him with UE support from high/low table where he performed alternating arm raises vs hovering arms over table for trials of unsupported standing. He then completed RLE NMR with 3# ankle weight including LAQ, LAQ + isometric holds, hip flexion and hip flexion + isometric holds. He required AAROM for hip flexion due to weakness. Performed several bouts of lateral scooting with supervision along mat table, reinforcing LIFTING rather than SCOOTING his hips to promote carryover into functional squat>pivot transfers. Squat>pivot with min/modA back to his w/c and he propelled himself back to his room, ~125ft, w31fsupervision. Squat>pivot with minA from w/c to EOB and he remained supine in bed  with daughter at bedside and bed alarm on with needs in reach.  Therapy  Documentation Precautions:  Precautions Precautions: Fall,Back Precaution Comments: General post-op back precautions Restrictions Weight Bearing Restrictions: No  Therapy/Group: Individual Therapy  Orine Goga P Wylder Macomber PT 03/06/2020, 7:58 AM

## 2020-03-06 NOTE — Progress Notes (Signed)
Occupational Therapy Session Note  Patient Details  Name: Timothy Ware MRN: [id number] Date of Birth: [birth year]  Today's Date: 03/06/2020 OT Individual Time: 1430-1530 OT Individual Time Calculation (min): 60 min    Short Term Goals: Week 1:  OT Short Term Goal 1 (Week 1): Pt will complete toileting with min assist OT Short Term Goal 2 (Week 1): Pt will complete toilet transfer with CGA OT Short Term Goal 3 (Week 1): Pt will complete LB dressing with min assist OT Short Term Goal 4 (Week 1): Pt will complete LB bathing with min assist  Skilled Therapeutic Interventions/Progress Updates:    Pt supine in bed, no c/o pain, completing his RLE HEP independently.  Pt agreeable to OT session.  Pt completed supine to sit EOB with supervision and setup for bedside bathing and dressing.  Pt completed UB bathing and dressing with supervision.  Pt completed LB bathing needing mod/max assist for sit<>stand at RW with blocking of right knee to prevent buckling in order for pt to pull pants up/down over hips. Pt had one posterior LOB needing mod assist to recover.  Pt trained on bed level bathing peri region and buttocks to increase independence and safety level.  Pt slightly impulsive, laying straight back to clean periarea despite OT VCs and TCs to facilitate sidelying position.  Pt with improved follow through to bath buttocks in sidelying with supervision to ensure pt following back precautions.  Pt donned socks and shoes with supervision using figure 4 position.   Pt completed squat pivot transfer requesting "to try it by myself" however upon pt attempt requiring min assist to recover from LOB and safely complete transfer due to pt pulling on w/c and tipping it slightly.  Re-educated pt on safe weight shifting for improved independence during squat pivots.Pt propelled self to ortho gym and completed squat pivot to nustep with CGA and improved technique.  Pt completed x 10 minutes at level 3 BLE propulsion  with VCs to maintain RLE adduction.  Squat pivot with CGA nustep to w/c then w/c to EOB.  Sit to supine with supervision. Call bell in reach, bed alarm on.  Therapy Documentation Precautions:  Precautions Precautions: Fall,Back Precaution Comments: General post-op back precautions Restrictions Weight Bearing Restrictions: No   Therapy/Group: Individual Therapy  Amie Critchley 03/06/2020, 4:45 PM

## 2020-03-07 ENCOUNTER — Inpatient Hospital Stay (HOSPITAL_COMMUNITY): Payer: Medicare Other | Admitting: Physical Therapy

## 2020-03-07 ENCOUNTER — Inpatient Hospital Stay (HOSPITAL_COMMUNITY): Payer: Medicare Other

## 2020-03-07 ENCOUNTER — Inpatient Hospital Stay (HOSPITAL_COMMUNITY): Payer: Medicare Other | Admitting: Occupational Therapy

## 2020-03-07 DIAGNOSIS — R799 Abnormal finding of blood chemistry, unspecified: Secondary | ICD-10-CM

## 2020-03-07 LAB — BASIC METABOLIC PANEL
Anion gap: 9 (ref 5–15)
BUN: 42 mg/dL — ABNORMAL HIGH (ref 8–23)
CO2: 22 mmol/L (ref 22–32)
Calcium: 8.5 mg/dL — ABNORMAL LOW (ref 8.9–10.3)
Chloride: 105 mmol/L (ref 98–111)
Creatinine, Ser: 1.31 mg/dL — ABNORMAL HIGH (ref 0.61–1.24)
GFR, Estimated: 54 mL/min — ABNORMAL LOW (ref >60)
Glucose, Bld: 118 mg/dL — ABNORMAL HIGH (ref 70–99)
Potassium: 4.6 mmol/L (ref 3.5–5.1)
Sodium: 136 mmol/L (ref 135–145)

## 2020-03-07 NOTE — Plan of Care (Signed)
  Problem: RH Wheelchair Mobility Goal: LTG Patient will propel w/c in controlled environment (PT) Description: LTG: Patient will propel wheelchair in controlled environment, # of feet with assist (PT) Flowsheets (Taken 03/07/2020 1600) LTG: Pt will propel w/c in controlled environ  assist needed:: Independent with assistive device LTG: Propel w/c distance in controlled environment: 552ft Goal: LTG Patient will propel w/c in home environment (PT) Description: LTG: Patient will propel wheelchair in home environment, # of feet with assistance (PT). Flowsheets (Taken 03/07/2020 1600) LTG: Pt will propel w/c in home environ  assist needed:: Independent with assistive device LTG: Propel w/c distance in home environment: 156ft Goal: LTG Patient will propel w/c in community environment (PT) Description: LTG: Patient will propel wheelchair in community environment, # of feet with assist (PT) Flowsheets (Taken 03/07/2020 1600) LTG: Pt will propel w/c in community environ  assist needed:: Supervision/Verbal cueing FYB:OFBPZW w/c distance in community environment: 54ft

## 2020-03-07 NOTE — Progress Notes (Signed)
Occupational Therapy Session Note  Patient Details  Name: Timothy Ware MRN: [id number] Date of Birth: 03-May-1934  Today's Date: 03/07/2020 OT Individual Time: 1100-1200 OT Individual Time Calculation (min): 60 min    Short Term Goals: Week 1:  OT Short Term Goal 1 (Week 1): Pt will complete toileting with min assist OT Short Term Goal 2 (Week 1): Pt will complete toilet transfer with CGA OT Short Term Goal 3 (Week 1): Pt will complete LB dressing with min assist OT Short Term Goal 4 (Week 1): Pt will complete LB bathing with min assist  Skilled Therapeutic Interventions/Progress Updates:    Pt supine in bed, no c/o pain, reports he may need to have a bowel movement.  Pt complete supine to sit without bed rail requiring CGA.  Pt completed squat pivot with CGA noting pt pulling on arm rest and slightly tilting w/c despite VCs and visual demo provided prior to transfer on proper body mechanics.  Pt self propelled to bathroom and positioned for squat pivot toilet transfer.  Instructed pt on squat pivot technique including dropping commode arm, however pt initiating stand pivot despite VCs requiring min assist for safe transfer completion.  Pt completed chair pushup for max assist clothing mgt. Pt had continent episode of urine only with distant supervsion.  Pt completed clothing mgt in same fashion as prior.  Squat pivot to w/c with CGA.  Pt propelled w/c to sink and completed hand hygiene, brushed teeth, and shaved in seated position with setup.  Pt propelled w/c to dayroom and completed squat pivot to nustep to complete x 8 minutes at level 3 resistance BLE propulsion requiring intermittent TCs and VCs to adduct RLE to facilitate good body mechanics.  Squat pivot to w/c with CGA and propelled w/c to room.  Squat pivot to EOB and sit to supine with CGA.  Call bell in reach, bed alarm on.    Therapy Documentation Precautions:  Precautions Precautions: Fall,Back Precaution Comments: General post-op  back precautions Restrictions Weight Bearing Restrictions: No   Therapy/Group: Individual Therapy  Amie Critchley 03/07/2020, 12:12 PM

## 2020-03-07 NOTE — Progress Notes (Signed)
Timothy Ware PHYSICAL MEDICINE & REHABILITATION PROGRESS NOTE  Subjective/Complaints: Patient seen sitting up in a chair this morning.  States he slept well overnight.  He notes only sensation of temperature with his right lower extremity.  ROS: Denies CP, SOB, N/V/D  Objective: Vital Signs: Blood pressure 136/72, pulse 88, temperature 97.8 F (36.6 C), temperature source Oral, resp. rate 17, height 6\' 1"  (1.854 m), weight 88.1 kg, SpO2 99 %. No results found. No results for input(s): WBC, HGB, HCT, PLT in the last 72 hours. Recent Labs    03/07/20 0444  NA 136  K 4.6  CL 105  CO2 22  GLUCOSE 118*  BUN 42*  CREATININE 1.31*  CALCIUM 8.5*    Intake/Output Summary (Last 24 hours) at 03/07/2020 0918 Last data filed at 03/07/2020 0844 Gross per 24 hour  Intake 820 ml  Output 1025 ml  Net -205 ml     Physical Exam: BP 136/72   Pulse 88   Temp 97.8 F (36.6 C) (Oral)   Resp 17   Ht 6\' 1"  (1.854 m)   Wt 88.1 kg   SpO2 99%   BMI 25.62 kg/m  Constitutional: No distress . Vital signs reviewed. HENT: Normocephalic.  Atraumatic. Eyes: EOMI. No discharge. Cardiovascular: No JVD.  RRR. Respiratory: Normal effort.  No stridor.  Bilateral clear to auscultation. GI: Non-distended.  BS +. Skin: Warm and dry.  Back incision with dressing CDI Psych: Normal mood.  Normal behavior. Musc: No edema in extremities.  No tenderness in extremities. Neurologic: Alert Bilateral upper extremities: 5/5 proximal distal Left lower extremity: Hip flexion, knee extension 4/5, ankle dorsiflexion 5/5 Right lower extremity: Hip flexion, knee extension 4--4/5, ankle dorsiflexion 5/5 Sensation diminished to light touch right lower extremity >> left lower extremity, stable  Assessment/Plan: 1. Functional deficits which require 3+ hours per day of interdisciplinary therapy in a comprehensive inpatient rehab setting.  Physiatrist is providing close team supervision and 24 hour management of active  medical problems listed below.  Physiatrist and rehab team continue to assess barriers to discharge/monitor patient progress toward functional and medical goals   Care Tool:  Bathing    Body parts bathed by patient: Right arm,Left arm,Chest,Abdomen,Front perineal area,Right upper leg,Left upper leg,Face     Body parts n/a: Right lower leg,Left lower leg   Bathing assist Assist Level: Moderate Assistance - Patient 50 - 74%     Upper Body Dressing/Undressing Upper body dressing   What is the patient wearing?: Pull over shirt    Upper body assist Assist Level: Supervision/Verbal cueing    Lower Body Dressing/Undressing Lower body dressing      What is the patient wearing?: Pants     Lower body assist Assist for lower body dressing: Moderate Assistance - Patient 50 - 74%     Toileting Toileting    Toileting assist Assist for toileting: Independent with assistive device Assistive Device Comment:  (urinal)   Transfers Chair/bed transfer  Transfers assist     Chair/bed transfer assist level: Minimal Assistance - Patient > 75%     Locomotion Ambulation   Ambulation assist      Assist level: Dependent - Patient 0% Assistive device: Lite Gait Max distance: 45 ft   Walk 10 feet activity   Assist  Walk 10 feet activity did not occur: Safety/medical concerns  Assist level: Dependent - Patient 0% Assistive device: Lite Gait   Walk 50 feet activity   Assist Walk 50 feet with 2 turns activity did not  occur: Safety/medical concerns         Walk 150 feet activity   Assist Walk 150 feet activity did not occur: Safety/medical concerns         Walk 10 feet on uneven surface  activity   Assist Walk 10 feet on uneven surfaces activity did not occur: Safety/medical concerns         Wheelchair     Assist Will patient use wheelchair at discharge?: Yes Type of Wheelchair: Manual    Wheelchair assist level: Supervision/Verbal cueing Max  wheelchair distance: >250 ft    Wheelchair 50 feet with 2 turns activity    Assist        Assist Level: Supervision/Verbal cueing   Wheelchair 150 feet activity     Assist      Assist Level: Supervision/Verbal cueing    Medical Problem List and Plan: 1.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 Status post transpedicular microdiscectomy T10-T11 02/25/2020. No brace required  Continue CIR  Decadron decreased to 4 twice daily on 12/8, decreased to daily on 12/13 2. Antithrombotics: -DVT/anticoagulation:SCDs.  Vascular studies reviewed negative for DVT -antiplatelet therapy: Aspirin 81 mg daily 3.  Postoperative pain:  Controlled on 12/14  Monitor with increased exertion 4. Mood:Provide emotional support -antipsychotic agents: N/A 5. Neuropsych: This patientiscapable of making decisions on hisown behalf. 6. Skin/Wound Care:Routine skin checks 7. Fluids/Electrolytes/Nutrition:Routine in and outs. 8. Hypertension.   Continue tenormin 25mg  daily and lotensin 10mg  daily  Controlled on 12/14 9. CAD with PTCA. Aspirin 81 mg daily. 10. Hyperlipidemia. Lipitor 11.  Hypoalbuminemia  Supplement initiated  12.  Steroid-induced hyperglycemia  Monitor with tapering steroids, mildly elevated on 12/14 13. Bradycardia: Does f/u with cardiology, has an appointment in January.   Relatively controlled on 12/14 14.  Elevated BUN/creatinine  Creatinine 1.31 on 12/14  Encourage fluids  LOS: 8 days A FACE TO FACE EVALUATION WAS PERFORMED  Xayne Brumbaugh 1/15 03/07/2020, 9:18 AM

## 2020-03-07 NOTE — Progress Notes (Signed)
Physical Therapy Session Note  Patient Details  Name: Timothy Ware MRN: [id number] Date of Birth: 1934/09/12  Today's Date: 03/07/2020 PT Individual Time: 1345-1427 + 1500-1530 PT Individual Time Calculation (min): 42 min  + 30 min  Short Term Goals: Week 2:  PT Short Term Goal 1 (Week 2): Pt will complete bed mobility mod I PT Short Term Goal 2 (Week 2): Pt will perform bed<>chair with CGA and LRAD PT Short Term Goal 3 (Week 2): Pt will ambulate 33ft with minA and LRAD  Skilled Therapeutic Interventions/Progress Updates:     1st session: Pt greeted supine in bed with wife at bedside, pt agreeable to PT session without reports of pain. Lengthy discussion held with patient and spouse regarding pt's progress with mobility, barriers to progressing functional gait, purposes of upcoming w/c evaluation, and DC prepping. Wife has a home measurement sheet and their son will be filling this out. Performed supine<>sit with supervision with HOB flat and no rails, completed via log rolling technique. Performed squat<>pivot transfer with minA from EOB to w/c with cues provided for proper setup, sequencing, and monitoring R foot placement. Focused remainder of session on w/c mobility in community setting. Wheeled himself in hallways to the elevator mod I and when outside with supervision. He was able to navigate unlevel pavement with supervision in his w/c but he required minA for propelling up a moderately steep incline as well as navigating small thresholds. He report's mild bilateral shoulder discomfort after propelling far distances and reports a history of chronic rotator cuff pain. After a brief rest break, he was able to propel himself back up to his room with supervision and he remained seated in w/c at end of session with chair alarm on and needs in reach.  2nd session: Pt greeted seated in w/c, agreeable to PT session without reports of pain. Focus of session to continue functional transfer training,  gait training, and coordinate w/c evaluation with Barbara Cower from [facility]. Pt propelled himself mod I in w/c 150ft in hallways using BUE's to main therapy gym. Performed squat<>pivot transfer with minA from w/c to mat table. Barbara Cower shortly arriving thereafter and conversation held regarding patient's current mobility status and concern for returning to functional gait by time of DC. ATP recommending standard w/c for interim and to continue assessing progress until decision is made to order custom w/c. Pt in agreement. Performed squat<>pivot with minA back to his w/c and positioned him in // bars to focus remainder of session on gait training. Sit>stand while pulling up with BUE's in // bars with minA and R knee block. Performed pre-gait training with forward/backward stepping with RLE (therapist assisting with guiding foot placement) and also with LLE (after seated rest break) with R knee block. Then, with chair follow, he ambulated length of // bars with modA for balance, lateral weight shifting, and assisting with R foot placement. Improved postural corrections, R foot advancement, and coordination compared to prior sessions. He was transported back to his room for time management and performed squat>pivot with minA back to bed from his w/c. Sit>supine with CGA and he ended session supine in bed with bed alarm on and needs in reach.  Therapy Documentation Precautions:  Precautions Precautions: Fall,Back Precaution Comments: General post-op back precautions Restrictions Weight Bearing Restrictions: No  Therapy/Group: Individual Therapy  Lilyanah Celestin P Darsha Zumstein PT 03/07/2020, 7:33 AM

## 2020-03-07 NOTE — Progress Notes (Signed)
Physical Therapy Session Note  Patient Details  Name: Timothy Ware MRN: [id number] Date of Birth: [birth year]  Today's Date: 03/07/2020 PT Individual Time: 0900-1013 PT Individual Time Calculation (min): 73 min   Short Term Goals: Week 1:  PT Short Term Goal 1 (Week 1): Pt will complete bed mobility with minA PT Short Term Goal 2 (Week 1): Pt will perform bed<>chair with minA and LRAD PT Short Term Goal 3 (Week 1): Pt will ambulate 86ft with minA and LRAD PT Short Term Goal 4 (Week 1): Pt will initiate stair training Week 2:  PT Short Term Goal 1 (Week 2): Pt will complete bed mobility mod I PT Short Term Goal 2 (Week 2): Pt will perform bed<>chair with CGA and LRAD PT Short Term Goal 3 (Week 2): Pt will ambulate 73ft with minA and LRAD  Skilled Therapeutic Interventions/Progress Updates:    pt received in recliner and agreeable to therapy. Pt directed in Stand pivot transfer to [facility] from recliner mod A for Sit to stand from recliner and max A for safety with pivot transfer to [facility], pt unable to safely mobilize RLE for improved pivot technique to chair and required increased assist to fully sit at [facility] seat. Once in [facility], pt able to reposition self min A. Pt directed in WC mobility to gym supervision 250'. Pt directed in Sit to stand to Rolling walker in hallway at min A, min A for initial standing balance and directed to attempt standing marching with RLE to improve coordination of RLE and foot clearance with visual target given for improved technique, pt completed x4 min A-mod A for stability in standing and fatigued quickly. Pt directed in this task a total of x5 with similar assist level and need of rest breaks 2/2 fatigue. Pt directed in x5 Sit to stand in // at min A with mirror for visual feedback and targeted RLE placement, pt completed x3 stands in // and target RLE placement x3 each with need of rest breaks 2/2 fatigue. Grossly min A for standing and mod A for balance with RLE placement. Pt  directed in [facility] mobility to return to room and requested to return to bed to rest, supervision level of WC mobility and pt able to setup WC for squat pivot. Pt directed in squat pivot to bedside CGA and min A for sit>supine. Pt left in bed, alarm set, All needs in reach and in good condition. Call light in hand.  Pt denied pain throughout session.   Therapy Documentation Precautions:  Precautions Precautions: Fall,Back Precaution Comments: General post-op back precautions Restrictions Weight Bearing Restrictions: No General:   Vital Signs: Therapy Vitals Temp: 98.4 F (36.9 C) Pulse Rate: 69 Resp: 17 BP: (!) 141/66 Patient Position (if appropriate): Lying Oxygen Therapy SpO2: 97 % O2 Device: Room Air Pain:   Mobility:   Locomotion :    Trunk/Postural Assessment :    Balance:   Exercises:   Other Treatments:      Therapy/Group: Individual Therapy  Barbaraann Faster 03/07/2020, 1:50 PM

## 2020-03-08 ENCOUNTER — Inpatient Hospital Stay (HOSPITAL_COMMUNITY): Payer: Medicare Other | Admitting: Occupational Therapy

## 2020-03-08 ENCOUNTER — Inpatient Hospital Stay (HOSPITAL_COMMUNITY): Payer: Medicare Other | Admitting: Physical Therapy

## 2020-03-08 NOTE — Progress Notes (Signed)
Occupational Therapy Weekly Progress Note  Patient Details  Name: Maxon Kresse MRN: [id number] Date of Birth: 01-Nov-1934  Beginning of progress report period: February 29, 2020 End of progress report period: March 08, 2020  Patient has met 0 of 4 short term goals.  Pt is progressing with his transfers, RLE strength and ADLs but is not yet at all the STG levels. Overall he is at a mod A level with ADLs vs min A and a min A level toilet transfer vs CGA.  Pt's LOS has been extended to provide him more time with progressing to a more independent level prior to returning home.   Patient continues to demonstrate the following deficits: muscle weakness and muscle paralysis, unbalanced muscle activation and decreased standing balance and decreased balance strategies and therefore will continue to benefit from skilled OT intervention to enhance overall performance with BADL.  Patient progressing toward long term goals..  Continue plan of care.  OT Short Term Goals Week 1:  OT Short Term Goal 1 (Week 1): Pt will complete toileting with min assist OT Short Term Goal 1 - Progress (Week 1): Progressing toward goal OT Short Term Goal 2 (Week 1): Pt will complete toilet transfer with CGA OT Short Term Goal 2 - Progress (Week 1): Progressing toward goal OT Short Term Goal 3 (Week 1): Pt will complete LB dressing with min assist OT Short Term Goal 3 - Progress (Week 1): Progressing toward goal OT Short Term Goal 4 (Week 1): Pt will complete LB bathing with min assist OT Short Term Goal 4 - Progress (Week 1): Progressing toward goal Week 2:  OT Short Term Goal 1 (Week 2): Pt will complete toilet transfer with CGA. OT Short Term Goal 2 (Week 2): Pt will be able to manage clothing over his hips at toilet with min A. OT Short Term Goal 3 (Week 2): Pt will be able to don pants with min A.   Therapy Documentation Precautions:  Precautions Precautions: Fall,Back Precaution Comments: General post-op back  precautions Restrictions Weight Bearing Restrictions: No      Pain: Pain Assessment Pain Scale: 0-10 Pain Score: 0-No pain ADL: ADL Grooming: Setup (sitting at w/c) Where Assessed-Grooming: Sitting at sink Upper Body Bathing: Setup Where Assessed-Upper Body Bathing: Sitting at sink Lower Body Bathing: Moderate assistance Where Assessed-Lower Body Bathing: Sitting at sink,Standing at sink Upper Body Dressing: Setup Where Assessed-Upper Body Dressing: Sitting at sink Lower Body Dressing: Moderate assistance Where Assessed-Lower Body Dressing: Sitting at sink,Standing at sink Toileting: Maximal assistance Where Assessed-Toileting: Other (Comment) (3 in 1 commode over toilet) Toilet Transfer: Moderate assistance Toilet Transfer Method: Squat pivot Toilet Transfer Equipment: Drop arm bedside commode     [place] 03/08/2020, 12:57 PM

## 2020-03-08 NOTE — Progress Notes (Signed)
Occupational Therapy Session Note  Patient Details  Name: Timothy Ware MRN: [id number] Date of Birth: 14-Jun-1934  Today's Date: 03/08/2020 OT Individual Time: 1140-1210 OT Individual Time Calculation (min): 30 min    Short Term Goals: Week 1:  OT Short Term Goal 1 (Week 1): Pt will complete toileting with min assist OT Short Term Goal 1 - Progress (Week 1): Progressing toward goal OT Short Term Goal 2 (Week 1): Pt will complete toilet transfer with CGA OT Short Term Goal 2 - Progress (Week 1): Progressing toward goal OT Short Term Goal 3 (Week 1): Pt will complete LB dressing with min assist OT Short Term Goal 3 - Progress (Week 1): Progressing toward goal OT Short Term Goal 4 (Week 1): Pt will complete LB bathing with min assist OT Short Term Goal 4 - Progress (Week 1): Progressing toward goal Week 2:  OT Short Term Goal 1 (Week 2): Pt will complete toilet transfer with CGA. OT Short Term Goal 2 (Week 2): Pt will be able to manage clothing over his hips at toilet with min A. OT Short Term Goal 3 (Week 2): Pt will be able to don pants with min A.  Skilled Therapeutic Interventions/Progress Updates:    1:1 focus on transfer training with focus on squat pivot transfers to different surfaces. Pt donned shoes with A to adjust back of shoe. Transferred to w/c with min A towards the right and transported self to apartment. Performed transfers w/c<> bed with min to mod A (mod A towards the right). Transferred to and from the couch - min towards the couch to the right but mod A to the left due to height difference. Pt propelled with LEs back to RN station (from apartment) with focus on activation in right LE to kick forward with control with min A . Transferred back to bed with min A and returned to supine.   Therapy Documentation Precautions:  Precautions Precautions: Fall,Back Precaution Comments: General post-op back precautions Restrictions Weight Bearing Restrictions: No Pain:  no c/o  pain   Therapy/Group: Individual Therapy  [name] [facility] 03/08/2020, 3:59 PM

## 2020-03-08 NOTE — Progress Notes (Addendum)
Verbal orders received during team conference per MD Allena Katz to remove honeycomb dressing to back incision. Incision OTA. Mylo Red, LPN

## 2020-03-08 NOTE — Progress Notes (Signed)
Patient ID: Timothy Ware, male   DOB: 05/19/1934, 85 y.o.   MRN: [id number]  Met with pt and daughter who was in his room to discuss team conference goals of supervision-min assist and discharge extended to 12/29 to reach the current goals. He feels it has been slower than he would like and is trying his best to get his legs working. Stalls was here for a wheelchair eval Tuesday. Discussed home health and other equipment needs. Will continue to follow and assist with discharge needs. He is fine with the extension  

## 2020-03-08 NOTE — Progress Notes (Signed)
Physical Therapy Session Note  Patient Details  Name: Timothy Ware MRN: [id number] Date of Birth: [birth year]  Today's Date: 03/08/2020 PT Individual Time: 0805-0900 PT Individual Time Calculation (min): 55 min   Short Term Goals: Week 2:  PT Short Term Goal 1 (Week 2): Pt will complete bed mobility mod I PT Short Term Goal 2 (Week 2): Pt will perform bed<>chair with CGA and LRAD PT Short Term Goal 3 (Week 2): Pt will ambulate 72ft with minA and LRAD  Skilled Therapeutic Interventions/Progress Updates: Pt presented in bed agreeable to therapy. Pt denies pain but states incision site is itchy, rest breaks provided as needed throughout session. Pt performed supine to sit to EOB with supervision and use of bed features. Performed squat pivot transfer with CGA and PTA blocking w/c to minimize slipping. Pt then propelled to sink and performed oral hygiene mod I. Pt then propelled to rehab gym supervision and was able to lock breaks and remove leg rests with supervision. Pt then performed squat pivot transfer to mat. Pt then set up with maxi sky sling and participated in Gait training in [place]. Pt required modA for STS from both mat and w/c. Pt initially ambulating 58ft then 77ft with maxA and PTA assisting advancement of RLE, blocking R knee, and facilitating wt shift to L. Pt required extended seated rest breaks between bouts. Pt then ambulated 83ft with maxA however pt demonstrating improved initiation of advancing RLE (compensating using R hip flexors to advance LE and dorsiflexing to clear foot). With verbal cues pt was able to clear with PTA only blocking R knee and assisting with safe placement due to string adduction near scissoring steps. Pt propelled back to room after activity and performed squat pivot transfer CGA and left sitting EOB per nsg request to receive pain meds.      Therapy Documentation Precautions:  Precautions Precautions: Fall,Back Precaution Comments: General post-op back  precautions Restrictions Weight Bearing Restrictions: No General:   Vital Signs:      Therapy/Group: Individual Therapy  [name]  [place], PTA  03/08/2020, 12:23 PM

## 2020-03-08 NOTE — Patient Care Conference (Signed)
Inpatient RehabilitationTeam Conference and Plan of Care Update Date: 03/08/2020   Time: 11:04 AM    Patient Name: Timothy Ware      Medical Record Number: [id number]  Date of Birth: 1935-03-11 Sex: Male         Room/Bed: 4M07C/4M07C-01 Payor Info: Payor: MEDICARE / Plan: MEDICARE PART A AND B / Product Type: *No Product type* /    Admit Date/Time:  02/28/2020  4:15 PM  Primary Diagnosis:  HNP (herniated nucleus pulposus with myelopathy), thoracic  Hospital Problems: Principal Problem:   HNP (herniated nucleus pulposus with myelopathy), thoracic Active Problems:   Thoracic myelopathy   Hypoalbuminemia due to protein-calorie malnutrition (HCC)   Essential hypertension   Postoperative pain   Steroid-induced hyperglycemia   Labile blood pressure   Elevated serum creatinine   Bradycardia   Elevated BUN    Expected Discharge Date: Expected Discharge Date: 03/22/20  Team Members Present: Physician leading conference: Dr. Maryla Morrow Care Coodinator Present: Chana Bode, RN, BSN, CRRN;Becky Dupree, LCSW Nurse Present: Luevenia Maxin, LPN PT Present: Grier Rocher, PT OT Present: Primitivo Gauze, OT PPS Coordinator present : Fae Pippin, [place], PT     Current Status/Progress Goal Weekly Team Focus  Bowel/Bladder             Swallow/Nutrition/ Hydration             ADL's             Mobility   minA bed mobility, min/modA squat>pivot transfers, gait ~29ft with LiteGait and partial BWS, gait ~76ft mod/maxA with RW. W/c mobility supervision/mod I.  supervision. Anticipate need to downgrade ambulation & stair goals and add a w/c goal  Please extend stay to 12/29!! Need time for gait progress, functional transfers, family ed, and possibly custom w/c. Wheelchair eval with Barbara Cower from [facility] to assist with evaluating wheelchair needs or at least to establish plan of care for follow up when patient leaves rehab. Continuing to work on functional transfers, gait  training as able.   Communication             Safety/Cognition/ Behavioral Observations            Pain             Skin               Discharge Planning:  Home with wife who can provide supervision level, due to her own back issues. Pt seems to makng progress more slowly than expected. Ques extending dc date   Team Discussion: Min - Mod assist for squat pivot transfers, mod assist for self care and mod-max assist for ambulation. Patient on target to meet rehab goals: yes, supervision goals set and wheelchair at discharge. Do not anticipate patient will be a functional ambulator at discharge  *See Care Plan and progress notes for long and short-term goals.   Revisions to Treatment Plan:  Goals downgraded and trials with the lite gait Teaching Needs: Transfers, toileting, medications, etc.  Current Barriers to Discharge: Decreased caregiver support  Possible Resolutions to Barriers: Family education Wheelchair goals for discharge     Medical Summary Current Status: Thoracic myelopathy secondary to thoracic disc herniation T10-T11.  Status post transpedicular microdiscectomy T10-T11 02/25/2020  Barriers to Discharge: Medical stability;Other (comments);Decreased family/caregiver support   Possible Resolutions to [employer]: Therapies, optimize BP meds, optimize pain meds- improving, follow CBGs with steroid taper follow lab -BUN/Cr, encourage fluids   Continued Need for Acute Rehabilitation Level of  Care: The patient requires daily medical management by a physician with specialized training in physical medicine and rehabilitation for the following reasons: Direction of a multidisciplinary physical rehabilitation program to maximize functional independence : Yes Medical management of patient stability for increased activity during participation in an intensive rehabilitation regime.: Yes Analysis of laboratory values and/or radiology reports with any subsequent need  for medication adjustment and/or medical intervention. : Yes   I attest that I was present, lead the team conference, and concur with the assessment and plan of the team.   Chana Bode B 03/08/2020, 2:03 PM

## 2020-03-08 NOTE — Progress Notes (Signed)
[place] PHYSICAL MEDICINE & REHABILITATION PROGRESS NOTE  Subjective/Complaints: Patient seen on sitting up in his chair working with therapy this morning.  He states he slept well last night.  He has questions regarding medications.  ROS: Denies CP, SOB, N/V/D  Objective: Vital Signs: Blood pressure 126/71, pulse 62, temperature 98.1 F (36.7 C), resp. rate 18, height 6\' 1"  (1.854 m), weight 87.4 kg, SpO2 99 %. No results found. No results for input(s): WBC, HGB, HCT, PLT in the last 72 hours. Recent Labs    03/07/20 0444  NA 136  K 4.6  CL 105  CO2 22  GLUCOSE 118*  BUN 42*  CREATININE 1.31*  CALCIUM 8.5*    Intake/Output Summary (Last 24 hours) at 03/08/2020 0913 Last data filed at 03/08/2020 0802 Gross per 24 hour  Intake 840 ml  Output 800 ml  Net 40 ml     Physical Exam: BP 126/71   Pulse 62   Temp 98.1 F (36.7 C)   Resp 18   Ht 6\' 1"  (1.854 m)   Wt 87.4 kg   SpO2 99%   BMI 25.42 kg/m  Constitutional: No distress . Vital signs reviewed. HENT: Normocephalic.  Atraumatic. Eyes: EOMI. No discharge. Cardiovascular: No JVD.  RRR. Respiratory: Normal effort.  No stridor.  Bilateral clear to auscultation. GI: Non-distended.  BS +. Skin: Warm and dry.  Back incision with dressing CDI. Psych: Normal mood.  Normal behavior. Musc: No edema in extremities.  No tenderness in extremities. Neurologic: Alert Bilateral upper extremities: 5/5 proximal distal Left lower extremity: Hip flexion, knee extension 4/5, ankle dorsiflexion 5/5 Right lower extremity: Hip flexion, knee extension 4--4/5, ankle dorsiflexion 5/5 Sensation diminished to light touch right lower extremity >> left lower extremity, unchanged  Assessment/Plan: 1. Functional deficits which require 3+ hours per day of interdisciplinary therapy in a comprehensive inpatient rehab setting.  Physiatrist is providing close team supervision and 24 hour management of active medical problems listed  below.  Physiatrist and rehab team continue to assess barriers to discharge/monitor patient progress toward functional and medical goals   Care Tool:  Bathing    Body parts bathed by patient: Right arm,Left arm,Chest,Abdomen,Front perineal area,Right upper leg,Left upper leg,Face     Body parts n/a: Right lower leg,Left lower leg   Bathing assist Assist Level: Moderate Assistance - Patient 50 - 74%     Upper Body Dressing/Undressing Upper body dressing   What is the patient wearing?: Pull over shirt    Upper body assist Assist Level: Supervision/Verbal cueing    Lower Body Dressing/Undressing Lower body dressing      What is the patient wearing?: Pants     Lower body assist Assist for lower body dressing: Moderate Assistance - Patient 50 - 74%     Toileting Toileting    Toileting assist Assist for toileting: Moderate Assistance - Patient 50 - 74% Assistive Device Comment:  (urinal)   Transfers Chair/bed transfer  Transfers assist     Chair/bed transfer assist level: Minimal Assistance - Patient > 75%     Locomotion Ambulation   Ambulation assist      Assist level: 2 helpers Assistive device: Parallel bars Max distance: 24ft   Walk 10 feet activity   Assist  Walk 10 feet activity did not occur: Safety/medical concerns  Assist level: Dependent - Patient 0% Assistive device: Lite Gait   Walk 50 feet activity   Assist Walk 50 feet with 2 turns activity did not occur: Safety/medical concerns  Walk 150 feet activity   Assist Walk 150 feet activity did not occur: Safety/medical concerns         Walk 10 feet on uneven surface  activity   Assist Walk 10 feet on uneven surfaces activity did not occur: Safety/medical concerns         Wheelchair     Assist Will patient use wheelchair at discharge?: Yes Type of Wheelchair: Manual    Wheelchair assist level: Supervision/Verbal cueing Max wheelchair distance: >250 ft     Wheelchair 50 feet with 2 turns activity    Assist        Assist Level: Supervision/Verbal cueing   Wheelchair 150 feet activity     Assist      Assist Level: Supervision/Verbal cueing    Medical Problem List and Plan: 1.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 Status post transpedicular microdiscectomy T10-T11 02/25/2020. No brace required  Continue CIR  Decadron decreased to 4 twice daily on 12/8, decreased to daily on 12/13, plan to decrease further tomorrow  Team conference today to discuss current and goals and coordination of care, home and environmental barriers, and discharge planning with nursing, case manager, and therapies. Please see conference note from today as well.  2. Antithrombotics: -DVT/anticoagulation:SCDs.  Vascular studies reviewed negative for DVT -antiplatelet therapy: Aspirin 81 mg daily 3.  Postoperative pain:  Controlled on 12/15  Monitor with increased exertion 4. Mood:Provide emotional support -antipsychotic agents: N/A 5. Neuropsych: This patientiscapable of making decisions on hisown behalf. 6. Skin/Wound Care:Routine skin checks 7. Fluids/Electrolytes/Nutrition:Routine in and outs. 8. Hypertension.   Continue tenormin 25mg  daily and lotensin 10mg  daily  Controlled on 12/15 9. CAD with PTCA. Aspirin 81 mg daily. 10. Hyperlipidemia. Lipitor 11.  Hypoalbuminemia  Supplement initiated  12.  Steroid-induced hyperglycemia  Monitor with tapering steroids, mildly elevated on 12/14 13. Bradycardia: Does f/u with cardiology, has an appointment in January.   Relatively controlled on 12/15 14.  Elevated BUN/creatinine  Creatinine 1.31 on 12/14  Encourage fluids  LOS: 9 days A FACE TO FACE EVALUATION WAS PERFORMED  Timothy Ware 1/16 03/08/2020, 9:13 AM

## 2020-03-08 NOTE — Progress Notes (Signed)
Occupational Therapy Session Note  Patient Details  Name: Gregery Walberg MRN: [id number] Date of Birth: 1935-03-12  Today's Date: 03/08/2020 OT Individual Time: 1015-1100 OT Individual Time Calculation (min): 45 min    Short Term Goals: Week 1:  OT Short Term Goal 1 (Week 1): Pt will complete toileting with min assist OT Short Term Goal 2 (Week 1): Pt will complete toilet transfer with CGA OT Short Term Goal 3 (Week 1): Pt will complete LB dressing with min assist OT Short Term Goal 4 (Week 1): Pt will complete LB bathing with min assist  Skilled Therapeutic Interventions/Progress Updates:    Pt received in bed and agreeable to therapy. Pt with questions about rehab as far as how it impacts recovery after spinal cord damage. Discussed this at length so pt had a better understanding.  Pt declined b/d today stating he did it yesterday.  He was agreeable to working on transfers.  Pt sat to EOB with S and then worked on scoot transfers on bed and squat pivots to wc and back to bed 3x.  He actually had good hand placement but needed cues to stay in a forward weight shift with hips elevated to complete a smooth transfer to wc seat. When he followed these cues, he did very well at a min A.   Pt kept saying his R leg was "dead" but he does have active movement and worked with pt working on AROM for his knee and ankle.  Pt opted to stay in bed as it would be awhile to next session. Moved back to supine with S. Bed alarm set and all needs met.  Great participation today.   Therapy Documentation Precautions:  Precautions Precautions: Fall,Back Precaution Comments: General post-op back precautions Restrictions Weight Bearing Restrictions: No   Pain: Pain Assessment Pain Scale: 0-10 Pain Score: 0-No pain ADL: ADL Grooming: Setup (sitting at w/c) Where Assessed-Grooming: Sitting at sink Upper Body Bathing: Setup Where Assessed-Upper Body Bathing: Sitting at sink Lower Body Bathing: Moderate  assistance Where Assessed-Lower Body Bathing: Sitting at sink,Standing at sink Upper Body Dressing: Setup Where Assessed-Upper Body Dressing: Sitting at sink Lower Body Dressing: Moderate assistance Where Assessed-Lower Body Dressing: Sitting at sink,Standing at sink Toileting: Maximal assistance Where Assessed-Toileting: Other (Comment) (3 in 1 commode over toilet) Toilet Transfer: Moderate assistance Toilet Transfer Method: Squat pivot Toilet Transfer Equipment: Drop arm bedside commode Vision   Perception    Praxis   Exercises:   Other Treatments:     Therapy/Group: Individual Therapy  [place] 03/08/2020, 11:08 AM

## 2020-03-08 NOTE — Progress Notes (Signed)
Physical Therapy Session Note  Patient Details  Name: Timothy Ware MRN: [id number] Date of Birth: Apr 19, 1934  Today's Date: 03/08/2020 PT Individual Time: 1400-1455 PT Individual Time Calculation (min): 55 min   Short Term Goals: Week 2:  PT Short Term Goal 1 (Week 2): Pt will complete bed mobility mod I PT Short Term Goal 2 (Week 2): Pt will perform bed<>chair with CGA and LRAD PT Short Term Goal 3 (Week 2): Pt will ambulate 11ft with minA and LRAD  Skilled Therapeutic Interventions/Progress Updates:    Pt received supine in bed, agreeable to PT session. No complaints of pain. Supine to sit with Supervision with use of bedrails. Assisted pt with donning shoes while seated EOB. Squat pivot transfer bed to w/c with close Supervision. Manual w/c propulsion 2 x 150 ft with use of BUE at Supervision level. Pt is independent for management of w/c parts. Sit to stand in // bars with min A with use of BUE on // bars and mod A with one UE pushing up from w/c armrest. Initially in standing pt exhibits decreased WBing on RLE. Placed 1" step under LLE, sit to stand 2 x 5 reps with min A. Pt exhibits improved weight shift to the R with use of step under LLE with min manual cueing needed to assist with weight shift. Sit to stand x 5 reps with RUE pushing up from w/c armrest with min to mod A needed to stand. Pt exhibits increased difficulty with sit to stand transfer with use of one UE on w/c armrest. Pt able to safely setup w/c for squat pivot transfers. Squat pivot transfer w/c to/from mat table x 4 reps with close Supervision to CGA. Pt returned to bed at end of session, Supervision for bed mobility. Pt left supine in bed with needs in reach, bed alarm in place at end of session.  Therapy Documentation Precautions:  Precautions Precautions: Fall,Back Precaution Comments: General post-op back precautions Restrictions Weight Bearing Restrictions: No   Therapy/Group: Individual Therapy   Peter Congo, PT, DPT  03/08/2020, 5:11 PM

## 2020-03-09 ENCOUNTER — Inpatient Hospital Stay (HOSPITAL_COMMUNITY): Payer: Medicare Other

## 2020-03-09 ENCOUNTER — Inpatient Hospital Stay (HOSPITAL_COMMUNITY): Payer: Medicare Other | Admitting: Occupational Therapy

## 2020-03-09 DIAGNOSIS — N319 Neuromuscular dysfunction of bladder, unspecified: Secondary | ICD-10-CM

## 2020-03-09 DIAGNOSIS — R748 Abnormal levels of other serum enzymes: Secondary | ICD-10-CM

## 2020-03-09 LAB — URINALYSIS, ROUTINE W REFLEX MICROSCOPIC
Bilirubin Urine: NEGATIVE
Glucose, UA: NEGATIVE mg/dL
Hgb urine dipstick: NEGATIVE
Ketones, ur: NEGATIVE mg/dL
Leukocytes,Ua: NEGATIVE
Nitrite: NEGATIVE
Protein, ur: NEGATIVE mg/dL
Specific Gravity, Urine: 1.016 (ref 1.005–1.030)
pH: 5 (ref 5.0–8.0)

## 2020-03-09 MED ORDER — TAMSULOSIN HCL 0.4 MG PO CAPS
0.4000 mg | ORAL_CAPSULE | Freq: Every day | ORAL | Status: DC
  Administered 2020-03-09 – 2020-03-10 (×2): 0.4 mg via ORAL
  Filled 2020-03-09 (×2): qty 1

## 2020-03-09 NOTE — Progress Notes (Signed)
Occupational Therapy Session Note  Patient Details  Name: Timothy Ware MRN: [id number] Date of Birth: 01/08/1935  Today's Date: 03/09/2020 OT Individual Time: 1446-1530 OT Individual Time Calculation (min): 44 min    Short Term Goals: Week 2:  OT Short Term Goal 1 (Week 2): Pt will complete toilet transfer with CGA. OT Short Term Goal 2 (Week 2): Pt will be able to manage clothing over his hips at toilet with min A. OT Short Term Goal 3 (Week 2): Pt will be able to don pants with min A.  Skilled Therapeutic Interventions/Progress Updates:    Pt in bed to start session.  He was agreeable to therapy and completed transfer to sitting with supervision using the bed rail for support.  He was able to complete squat pivot transfer to the wheelchair at min assist and then propel himself down to the ortho gym with modified independence.  Next, he transferred to the therapy mat a min assist again squat pivot and worked on dynamic and static standing balance from the EOM.  Pt with increased reports that he cannot feel what the right leg is doing, however he was able to exhibit active movement in the leg and foot when asked to move it.  Had him complete several standing intervals from the mat with mod assist for initial standing.  He needed max assist to maintain standing while working on swaying left to right as well as advancing the LLE forward and backwards and weightbearing through the RLE.  Increased knee flexion noted in standing during weightshift to the right when attempting to advance the LLE.  He was able to maintain standing for intervals of 1-2 mins before needing rest.  Finished session with stand pivot to the wheelchair at max assist and return to the room.  He completed squat pivot transfer to the bed from the wheelchair at min assist with transition to supine at supervision.  No reports of dizziness noted during session.     Therapy Documentation Precautions:  Precautions Precautions:  Fall,Back Precaution Comments: General post-op back precautions Restrictions Weight Bearing Restrictions: No  Pain: Pain Assessment Pain Scale: Faces Faces Pain Scale: Hurts little more Pain Type: Acute pain Pain Location: Back Pain Orientation: Lower Pain Descriptors / Indicators: Discomfort Pain Onset: With Activity Pain Intervention(s): Repositioned ADL: See Care Tool Section for some details of mobility and selfcare  Therapy/Group: Individual Therapy  Aggie Douse OTR/L 03/09/2020, 5:04 PM

## 2020-03-09 NOTE — Progress Notes (Signed)
Per therapy and pt, pt reports dizziness and "swimmy headed" when sitting and performing ADLS with OT this AM. Pt reports this is not a new finding and has been occurring intermittently for "40 years". Vitals obtained, WNL, assessment completed, no significant changes in vision, hands grasps, speech or numbness/tingling except for right leg which is related to current diagnosis.  Mylo Red, LPN

## 2020-03-09 NOTE — Progress Notes (Signed)
[place] PHYSICAL MEDICINE & REHABILITATION PROGRESS NOTE  Subjective/Complaints: Patient seen laying in bed this AM.  He states he did not sleep well overnight due to abdominal discomfort.  He states he feels as if he needs to have a BM and urinate.  Discussed with nursing.   ROS: +Abdominal discomfort. Denies CP, SOB, N/V/D  Objective: Vital Signs: Blood pressure (!) 146/64, pulse 62, temperature 98.6 F (37 C), resp. rate 17, height 6\' 1"  (1.854 m), weight 87.4 kg, SpO2 98 %. No results found. No results for input(s): WBC, HGB, HCT, PLT in the last 72 hours. Recent Labs    03/07/20 0444  NA 136  K 4.6  CL 105  CO2 22  GLUCOSE 118*  BUN 42*  CREATININE 1.31*  CALCIUM 8.5*    Intake/Output Summary (Last 24 hours) at 03/09/2020 0953 Last data filed at 03/09/2020 0918 Gross per 24 hour  Intake 604 ml  Output 1600 ml  Net -996 ml     Physical Exam: BP (!) 146/64   Pulse 62   Temp 98.6 F (37 C)   Resp 17   Ht 6\' 1"  (1.854 m)   Wt 87.4 kg   SpO2 98%   BMI 25.42 kg/m  Constitutional: No distress . Vital signs reviewed. HENT: Normocephalic.  Atraumatic. Eyes: EOMI. No discharge. Cardiovascular: No JVD.  RRR. Respiratory: Normal effort.  No stridor.  Bilateral clear to auscultation. GI: Non-distended.  BS +. +TTP inferior abdomen.  Skin: Warm and dry.  Back incision with dressing CDI. Psych: Normal mood.  Normal behavior. Musc: No edema in extremities.  No tenderness in extremities. Neurologic: Alert Bilateral upper extremities: 5/5 proximal distal Left lower extremity: Hip flexion, knee extension 4/5, ankle dorsiflexion 5/5 Right lower extremity: Hip flexion, knee extension 4--4/5, ankle dorsiflexion 5/5 Sensation diminished to light touch right lower extremity >> left lower extremity, stable  Assessment/Plan: 1. Functional deficits which require 3+ hours per day of interdisciplinary therapy in a comprehensive inpatient rehab setting.  Physiatrist is  providing close team supervision and 24 hour management of active medical problems listed below.  Physiatrist and rehab team continue to assess barriers to discharge/monitor patient progress toward functional and medical goals   Care Tool:  Bathing    Body parts bathed by patient: Right arm,Left arm,Chest,Abdomen,Front perineal area,Right upper leg,Left upper leg,Face     Body parts n/a: Right lower leg,Left lower leg   Bathing assist Assist Level: Moderate Assistance - Patient 50 - 74%     Upper Body Dressing/Undressing Upper body dressing   What is the patient wearing?: Pull over shirt    Upper body assist Assist Level: Supervision/Verbal cueing    Lower Body Dressing/Undressing Lower body dressing      What is the patient wearing?: Pants     Lower body assist Assist for lower body dressing: Moderate Assistance - Patient 50 - 74%     Toileting Toileting    Toileting assist Assist for toileting: Moderate Assistance - Patient 50 - 74% Assistive Device Comment:  (urinal)   Transfers Chair/bed transfer  Transfers assist     Chair/bed transfer assist level: Contact Guard/Touching assist     Locomotion Ambulation   Ambulation assist      Assist level: 2 helpers Assistive device: Parallel bars Max distance: 81ft   Walk 10 feet activity   Assist  Walk 10 feet activity did not occur: Safety/medical concerns  Assist level: Dependent - Patient 0% Assistive device: Lite Gait   Walk 50  feet activity   Assist Walk 50 feet with 2 turns activity did not occur: Safety/medical concerns         Walk 150 feet activity   Assist Walk 150 feet activity did not occur: Safety/medical concerns         Walk 10 feet on uneven surface  activity   Assist Walk 10 feet on uneven surfaces activity did not occur: Safety/medical concerns         Wheelchair     Assist Will patient use wheelchair at discharge?: Yes Type of Wheelchair: Manual     Wheelchair assist level: Supervision/Verbal cueing Max wheelchair distance: 150'    Wheelchair 50 feet with 2 turns activity    Assist        Assist Level: Supervision/Verbal cueing   Wheelchair 150 feet activity     Assist      Assist Level: Supervision/Verbal cueing    Medical Problem List and Plan: 1.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 Status post transpedicular microdiscectomy T10-T11 02/25/2020. No brace required  Continue CIR  Decadron decreased to 4 twice daily on 12/8, decreased to daily on 12/13, continue in light of new symptoms, may need to increase again 2. Antithrombotics: -DVT/anticoagulation:SCDs.  Vascular studies reviewed negative for DVT -antiplatelet therapy: Aspirin 81 mg daily 3.  Postoperative pain:  Controlled on 12/16  Monitor with increased exertion 4. Mood:Provide emotional support -antipsychotic agents: N/A 5. Neuropsych: This patientiscapable of making decisions on hisown behalf. 6. Skin/Wound Care:Routine skin checks 7. Fluids/Electrolytes/Nutrition:Routine in and outs. 8. Hypertension.   Continue tenormin 25mg  daily and lotensin 10mg  daily  Relatively controlled on 12/16 9. CAD with PTCA. Aspirin 81 mg daily. 10. Hyperlipidemia. Lipitor 11.  Hypoalbuminemia  Supplement initiated  12.  Steroid-induced hyperglycemia  Monitor with tapering steroids, mildly elevated on 12/14 13. Bradycardia: Does f/u with cardiology, has an appointment in January.   Controlled on 12/16 14.  Elevated BUN/creatinine  Creatinine 1.31 on 12/14, labs ordered for tomorrow  Encourage fluids 15. Neurogenic bladder  PVRs ordered  Flomax ordered  UA ordered  Repeat Lumbar xray ordered  LOS: 10 days A FACE TO FACE EVALUATION WAS PERFORMED  Cruise Baumgardner 1/17 03/09/2020, 9:53 AM

## 2020-03-09 NOTE — Progress Notes (Addendum)
Pt reports pressure in lower abdomen. Pt cannot recall his last BM. Pt had large BM 12/14 and medium BM 12/15. Pt in discomfort, abdomen soft and bloated. PRN miralax given.  0900 pt PVR measured result is 632 ML 0917 650 mL of clear yellow urine obtained per straight cath.  No complications noted. Pt feels relief.  Mylo Red, LPN

## 2020-03-09 NOTE — Progress Notes (Signed)
Physical Therapy Session Note  Patient Details  Name: Timothy Ware MRN: [id number] Date of Birth: 1934-06-17  Today's Date: 03/09/2020 PT Individual Time: 0920-1000 and 7893-8101 PT Individual Time Calculation (min): 40 min and 42 min   Short Term Goals: Week 2:  PT Short Term Goal 1 (Week 2): Pt will complete bed mobility mod I PT Short Term Goal 2 (Week 2): Pt will perform bed<>chair with CGA and LRAD PT Short Term Goal 3 (Week 2): Pt will ambulate 31ft with minA and LRAD  Skilled Therapeutic Interventions/Progress Updates:     Session 1: Patient in bed with RN and NT initiating in/out catheterization at beginning of session. Patient missed 20 min of skilled PT due to nursing care, RN made aware. Will attempt to make-up missed time as able.  Patient in bed upon PT return. Patient alert and agreeable to PT session. Patient denied pain during session. Reported poor sleep last night due to bladder and bowl pressure, stated some relief after in/out cath, but asked to attempt to have a BM.  Therapeutic Activity: Bed Mobility: Patient performed supine to/from sit with supervision and increased time and effort. Provided verbal cues for log roll technique with poor compliance from patient. Transfers: Patient performed toilet transfer using the [facility] with CGA. Patient was unsuccessful with BM during toileting, provided cues for relaxation and diaphragmatic breathing rather than bearing down. Required total A for peri-care and LB clothing management during toileting. Attempted washing his hands in standing in the [place], but patient too fatigued to maintain balance at this time. Patient washed his hands and brushed his teeth in sitting in the stedy. Provided cues to maintain spinal precautions throughout.  He performed sit to/from stand x1 using the // bars with min-mod A for boosting up and forward weight shift. Provided verbal cues for scooting forward, foot placement, and forward weight shift, noted  improved hip extension with increased time without cues.  Gait Training:  Patient ambulated 8 feet using // bars with min A. Ambulated with delayed initiation of limb advancement with R foot, decreased L weight shift to improved clearance of R foot, decreased R hamstring activation in pre-swing, variable foot placement R>L, heavy use of upper extremities, and crouched gait without heavy cues for gluteal activation when stepping. Provided verbal cues for sequencing, foot placement R>L, facilitation for L weight shift and R hamstring activation, and verbal and tactile cues for lateral hip and gluteal activation on stance leg.  Wheelchair Mobility:  Patient propelled wheelchair >150 feet x2 with supervision-mod I. Provided verbal cues for safety awareness in tight spaces and entering // bars. Patient demonstrated management of w/c parts with min cues throughout session.   Patient in bed at end of session with breaks locked, bed alarm set, and all needs within reach.   Session 2: Patient in bed with his son at bedside upon PT arrival. Patient alert and agreeable to PT session. Patient denied pain during session. Reported feeling "worse than woozy" sitting EOB with OT. Per chart, vitals WNL.   Orthostatic vitals: Supine: BP 133/73, HR 78 Sitting: BP 137/70, HR 70 Standing: 139/70, HR 102 (patient reported feeling "woozy" and "shaky" in standing, noted patient holding his breath with increased effort through his upper extremities) Educated on use of breath support with effort with mobility versus Val Salva maneuver. Also, discussed the effects of fatigue on functional mobility, due to patient reporting poor sleep last night during earlier session.  Therapeutic Activity: Bed Mobility: Patient performed supine to/from sit  with supervision and increased time and effort. Provided verbal cues for log roll technique with poor compliance from patient. Transfers: Patient performed sit to/from stand x1 with  min-mod A using RW with bed slightly elevated. Provided verbal cues for hand placement, foot placement, forward weight shift, increased hip extension to come to standing, and reaching back for controlled descent.  Therapeutic Exercise: Patient performed the following exercises with verbal and tactile cues for proper technique, required min A AAROM on the R for all exercises. -B heel slides 2x10 -B SLR 2x10 -B hip abduction/adduction 2x10 -B isometric adduction hold in hook-lying x1 min -B clam shells in hook-lying x10 -Bridging 2x10  Patient in bed at end of session with breaks locked, bed alarm set, and all needs within reach.    Therapy Documentation Precautions:  Precautions Precautions: Fall,Back Precaution Comments: General post-op back precautions Restrictions Weight Bearing Restrictions: No General: PT Amount of Missed Time (min): 20 Minutes PT Missed Treatment Reason: Nursing care (in/out cath)    Therapy/Group: Individual Therapy  Glenden Rossell L Dosha Broshears PT, DPT  03/09/2020, 1:46 PM

## 2020-03-09 NOTE — Progress Notes (Signed)
Pt left per bed to XRAY. No complications noted Mylo Red, LPN

## 2020-03-09 NOTE — Progress Notes (Signed)
Pt return to 4M07. No complications noted.

## 2020-03-09 NOTE — Progress Notes (Addendum)
Pt complaining of tightness around lower abdomen describes it as "feeling like he needs to go" gave prn miralax 03/08/20 with evening meds but it was ineffective. He stated he is only passing gas. Pt had a large bm 03/07/20 and a medium bm 03/08/20. He stated he still feels like he needs to go. No other concerns to report.

## 2020-03-09 NOTE — Progress Notes (Signed)
Occupational Therapy Session Note  Patient Details  Name: Timothy Ware MRN: [id number] Date of Birth: [birth year]  Today's Date: 03/09/2020 OT Individual Time: 1105-1150 OT Individual Time Calculation (min): 45 min    Short Term Goals: Week 2:  OT Short Term Goal 1 (Week 2): Pt will complete toilet transfer with CGA. OT Short Term Goal 2 (Week 2): Pt will be able to manage clothing over his hips at toilet with min A. OT Short Term Goal 3 (Week 2): Pt will be able to don pants with min A.  Skilled Therapeutic Interventions/Progress Updates:    Pt supine in bed, reports he is very tired because he didn't get any sleep last night due to needing to be catheterized for bladder retention.  Pt agreeable to OT session within room for EOB self care and functional mobility training.  Pt completed supine to sit with CGA.  Upon sitting, pt with slight loss of balance multiple times static sitting needing CGA to recover.  Pt reports feeling "swimmy headed"; nurse notified and vitals assessed seated and standing; results WNL. Pt required CGA during stedy sit<>stand and static standing during vitals assessment.  Pt completed UB bathing and dressing with supervision and intermittent CGA.  Pt wanting to defer LB bathing and dressing due to "they already cleaned me up good".  Pt requesting to return back to bed to rest remainder of session.  15 minutes missed treatment due to fatigue.  Call bell in reach, bed alarm on.    Therapy Documentation Precautions:  Precautions Precautions: Fall,Back Precaution Comments: General post-op back precautions Restrictions Weight Bearing Restrictions: No   Therapy/Group: Individual Therapy  Amie Critchley 03/09/2020, 12:56 PM

## 2020-03-10 ENCOUNTER — Inpatient Hospital Stay (HOSPITAL_COMMUNITY): Payer: Medicare Other

## 2020-03-10 ENCOUNTER — Inpatient Hospital Stay (HOSPITAL_COMMUNITY): Payer: Medicare Other | Admitting: Occupational Therapy

## 2020-03-10 DIAGNOSIS — I951 Orthostatic hypotension: Secondary | ICD-10-CM

## 2020-03-10 LAB — BASIC METABOLIC PANEL
Anion gap: 10 (ref 5–15)
BUN: 31 mg/dL — ABNORMAL HIGH (ref 8–23)
CO2: 25 mmol/L (ref 22–32)
Calcium: 8.4 mg/dL — ABNORMAL LOW (ref 8.9–10.3)
Chloride: 101 mmol/L (ref 98–111)
Creatinine, Ser: 1.36 mg/dL — ABNORMAL HIGH (ref 0.61–1.24)
GFR, Estimated: 52 mL/min — ABNORMAL LOW (ref >60)
Glucose, Bld: 113 mg/dL — ABNORMAL HIGH (ref 70–99)
Potassium: 4.3 mmol/L (ref 3.5–5.1)
Sodium: 136 mmol/L (ref 135–145)

## 2020-03-10 MED ORDER — TAMSULOSIN HCL 0.4 MG PO CAPS
0.4000 mg | ORAL_CAPSULE | Freq: Every day | ORAL | Status: DC
  Administered 2020-03-11 – 2020-03-19 (×9): 0.4 mg via ORAL
  Filled 2020-03-10 (×9): qty 1

## 2020-03-10 NOTE — Progress Notes (Signed)
Occupational Therapy Session Note  Patient Details  Name: Timothy Ware MRN: [id number] Date of Birth: 11/30/1934  Today's Date: 03/10/2020 OT Individual Time: 1335-1430 OT Individual Time Calculation (min): 55 min    Short Term Goals: Week 2:  OT Short Term Goal 1 (Week 2): Pt will complete toilet transfer with CGA. OT Short Term Goal 2 (Week 2): Pt will be able to manage clothing over his hips at toilet with min A. OT Short Term Goal 3 (Week 2): Pt will be able to don pants with min A.  Skilled Therapeutic Interventions/Progress Updates:    Pt supine in bed, no c/o pain, reporting he would like to shave and wash his face with OT.  OT session focus on functional squat pivot transfers and compensatory training during toileting to increase safety and independence.  Pt completed supine to sit with supervision needing VCs to improve body mechanics.  Pt completed squat pivot EOB to w/c with CGA with VCs for safe hand placement RUE to prevent pulling on armrest of w/c.  Pt self propelled to sink and shaved/washed face seated setup.  Discussed alternative of using electric razor at home at either EOB or w/c level due to pt will be unable to access sink in bathroom due to inaccessibility.  Pt agreeable to completing in this fashion at home.  Pt completed squat pivot w/c to drop arm commode with CGA.  Educated pt on lateral leaning to doff pants over hips and pt able to complete with increased time and min assist.  Pt attempted, but unable to have bowel movement.  Pt pulled pants over hips with min assist using lateral lean technique.  Squat pivot transfer w/c to EOB with CGA.  Sit to supine with supervision.  Call bell in reach, bed alarm on.  Pt exhibited improved independence with squat pivot functional transfers today.    Therapy Documentation Precautions:  Precautions Precautions: Fall,Back Precaution Comments: General post-op back precautions Restrictions Weight Bearing Restrictions:  No   Therapy/Group: Individual Therapy  Amie Critchley 03/10/2020, 3:42 PM

## 2020-03-10 NOTE — Progress Notes (Signed)
Physical Therapy Session Note  Patient Details  Name: Timothy Ware MRN: [id number] Date of Birth: 08-Feb-1935  Today's Date: 03/10/2020 PT Individual Time: 1000-1100 PT Individual Time Calculation (min): 60 min   Short Term Goals: Week 2:  PT Short Term Goal 1 (Week 2): Pt will complete bed mobility mod I PT Short Term Goal 2 (Week 2): Pt will perform bed<>chair with CGA and LRAD PT Short Term Goal 3 (Week 2): Pt will ambulate 66ft with minA and LRAD  Skilled Therapeutic Interventions/Progress Updates:    Patient received supine in bed, agreeable to PT. He denies pain at rest. Patient able to come to sitting edge of bed, but before PT could intervene, patient not completing log roll technique to ensure maintaining back precautions. Reviewed importance of maintaining back precautions with patient for optimal outcomes/recovery. Patient verbalizing understanding. He was able to transfer to wc via lateral scoot with close supervision and verbal cuing. Patient propelling himself in wc to therapy gym ModI and transferring to therapy mat via lateral scoot with supervision. Patient completing tricep pushups, 3x10 with extended rest break between due to fatigue. He also completed seated scap retractions with blue theraband, 3x10. Patient requesting to review imaging and discuss anatomy of surgery. PT explaining anatomy of spine and discs as related to patients injury/surgery and symptoms that patient is experiencing. He is without further questions at this time. Patient returning to room in wc, transferring to bed via lateral scoot with supervision. Bed alarm on, call light within reach.   Therapy Documentation Precautions:  Precautions Precautions: Fall,Back Precaution Comments: General post-op back precautions Restrictions Weight Bearing Restrictions: No    Therapy/Group: Individual Therapy  Elizebeth Koller, PT, DPT, CBIS  03/10/2020, 7:49 AM

## 2020-03-10 NOTE — Progress Notes (Addendum)
[place] PHYSICAL MEDICINE & REHABILITATION PROGRESS NOTE  Subjective/Complaints: Patient seen laying in bed this morning. He states he slept well overnight. He states his abdomen feels much better. Discussed orthostasis with therapies. He notes improving sensation  ROS: Denies CP, SOB, N/V/D  Objective: Vital Signs: Blood pressure (!) 142/72, pulse 85, temperature 98 F (36.7 C), temperature source Oral, resp. rate 16, height 6\' 1"  (1.854 m), weight 87.4 kg, SpO2 98 %. DG THORACOLUMABAR SPINE  Result Date: 03/09/2020 CLINICAL DATA:  Chronic back pain EXAM: THORACOLUMBAR SPINE 1V COMPARISON:  February 25, 2020. FINDINGS: No evidence of acute fracture or traumatic malalignment. S-shaped thoracolumbar curvature. Vertebral body heights are maintained. Posterior fusion hardware spanning L2 through L4 without evidence of hardware complication. There is evidence of some osseous fusion across the disc spaces. There is adjacent level degenerative change at L1-L2 with disc height loss, endplate sclerosis, and endplate spurring. There is also facet hypertrophy at this level. Atherosclerotic calcifications. IMPRESSION: 1. No evidence of acute fracture or traumatic malalignment. 2. L2 through L4 posterior fusion with adjacent level L1-L2 degenerative changes. Electronically Signed   By: February 27, 2020 MD   On: 03/09/2020 15:53   No results for input(s): WBC, HGB, HCT, PLT in the last 72 hours. Recent Labs    03/10/20 0442  NA 136  K 4.3  CL 101  CO2 25  GLUCOSE 113*  BUN 31*  CREATININE 1.36*  CALCIUM 8.4*    Intake/Output Summary (Last 24 hours) at 03/10/2020 1054 Last data filed at 03/10/2020 0810 Gross per 24 hour  Intake 700 ml  Output 1275 ml  Net -575 ml     Physical Exam: BP (!) 142/72 (BP Location: Right Arm)   Pulse 85   Temp 98 F (36.7 C) (Oral)   Resp 16   Ht 6\' 1"  (1.854 m)   Wt 87.4 kg   SpO2 98%   BMI 25.42 kg/m  Constitutional: No distress . Vital signs  reviewed. HENT: Normocephalic.  Atraumatic. Eyes: EOMI. No discharge. Cardiovascular: No JVD.  RRR. Respiratory: Normal effort.  No stridor.  Bilateral clear to auscultation. GI: Non-distended.  BS +. Nontender Skin: Warm and dry. Back incision CDI. Psych: Normal mood.  Normal behavior. Musc: No edema in extremities.  No tenderness in extremities. Neurologic: Alert Bilateral upper extremities: 5/5 proximal distal Left lower extremity: Hip flexion, knee extension 4-4+/5, ankle dorsiflexion 5/5 Right lower extremity: Hip flexion, knee extension 4/5, ankle dorsiflexion 5/5 Sensation diminished to light touch right lower extremity >> left lower extremity, improving  Assessment/Plan: 1. Functional deficits which require 3+ hours per day of interdisciplinary therapy in a comprehensive inpatient rehab setting.  Physiatrist is providing close team supervision and 24 hour management of active medical problems listed below.  Physiatrist and rehab team continue to assess barriers to discharge/monitor patient progress toward functional and medical goals   Care Tool:  Bathing    Body parts bathed by patient: Right arm,Left arm,Chest,Abdomen,Front perineal area,Right upper leg,Left upper leg,Face     Body parts n/a: Right lower leg,Left lower leg   Bathing assist Assist Level: Moderate Assistance - Patient 50 - 74%     Upper Body Dressing/Undressing Upper body dressing   What is the patient wearing?: Pull over shirt    Upper body assist Assist Level: Supervision/Verbal cueing    Lower Body Dressing/Undressing Lower body dressing      What is the patient wearing?: Pants     Lower body assist Assist for lower body dressing:  Moderate Assistance - Patient 50 - 74%     Toileting Toileting    Toileting assist Assist for toileting: Moderate Assistance - Patient 50 - 74% Assistive Device Comment:  (urinal)   Transfers Chair/bed transfer  Transfers assist     Chair/bed  transfer assist level: Minimal Assistance - Patient > 75% (squat pivot)     Locomotion Ambulation   Ambulation assist      Assist level: Minimal Assistance - Patient > 75% Assistive device: Parallel bars Max distance: 8 ft   Walk 10 feet activity   Assist  Walk 10 feet activity did not occur: Safety/medical concerns  Assist level: Dependent - Patient 0% Assistive device: Lite Gait   Walk 50 feet activity   Assist Walk 50 feet with 2 turns activity did not occur: Safety/medical concerns         Walk 150 feet activity   Assist Walk 150 feet activity did not occur: Safety/medical concerns         Walk 10 feet on uneven surface  activity   Assist Walk 10 feet on uneven surfaces activity did not occur: Safety/medical concerns         Wheelchair     Assist Will patient use wheelchair at discharge?: Yes Type of Wheelchair: Manual    Wheelchair assist level: Supervision/Verbal cueing,Independent Max wheelchair distance: >150 ft    Wheelchair 50 feet with 2 turns activity    Assist        Assist Level: Supervision/Verbal cueing,Independent   Wheelchair 150 feet activity     Assist      Assist Level: Supervision/Verbal cueing,Independent    Medical Problem List and Plan: 1.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 Status post transpedicular microdiscectomy T10-T11 02/25/2020. No brace required  Continue CIR  Decadron decreased to 4 twice daily on 12/8, decreased to daily on 12/13, plan to decrease further on Monday  Repeat thoracolumbar x-ray on 12/16 stable without malalignment 2. Antithrombotics: -DVT/anticoagulation:SCDs.  Vascular studies reviewed negative for DVT -antiplatelet therapy: Aspirin 81 mg daily   CBC ordered for Monday 3.  Postoperative pain:  Controlled on 12/17  Monitor with increased exertion 4. Mood:Provide emotional support -antipsychotic agents:  N/A 5. Neuropsych: This patientiscapable of making decisions on hisown behalf. 6. Skin/Wound Care:Routine skin checks 7. Fluids/Electrolytes/Nutrition:Routine in and outs. 8. Hypertension.   Continue tenormin 25mg  daily and lotensin 10mg  daily  Relatively controlled on 12/17 9. CAD with PTCA. Aspirin 81 mg daily. 10. Hyperlipidemia. Lipitor 11.  Hypoalbuminemia  Supplement initiated  12.  Steroid-induced hyperglycemia  Monitor with tapering steroids, mildly elevated on 12/17 13. Bradycardia: Does f/u with cardiology, has an appointment in January.   Controlled on 12/17 14.  Elevated BUN/creatinine  Creatinine 1.36 on 12/17, labs ordered for Monday  Encourage fluids 15. Urinary retention  UA negative  PVRs unremarkable after initial elevated volume  Flomax ordered, changed to nightly due to orthostasis  Improving, continue to monitor 16. Orthostasis-new problem  Likely medication induced, see #15  Continue to monitor  LOS: 11 days A FACE TO FACE EVALUATION WAS PERFORMED  Luisana Lutzke 1/18 03/10/2020, 10:54 AM

## 2020-03-10 NOTE — Progress Notes (Signed)
Physical Therapy Session Note  Patient Details  Name: Timothy Ware MRN: [id number] Date of Birth: Apr 13, 1934  Today's Date: 03/10/2020 PT Individual Time: 0800-0900 PT Individual Time Calculation (min): 60 min   Short Term Goals: Week 1:  PT Short Term Goal 1 (Week 1): Pt will complete bed mobility with minA PT Short Term Goal 2 (Week 1): Pt will perform bed<>chair with minA and LRAD PT Short Term Goal 3 (Week 1): Pt will ambulate 19ft with minA and LRAD PT Short Term Goal 4 (Week 1): Pt will initiate stair training Week 2:  PT Short Term Goal 1 (Week 2): Pt will complete bed mobility mod I PT Short Term Goal 2 (Week 2): Pt will perform bed<>chair with CGA and LRAD PT Short Term Goal 3 (Week 2): Pt will ambulate 79ft with minA and LRAD Week 3:     Skilled Therapeutic Interventions/Progress Updates:    PAIN denies pain this am Pt initially supine and agreeable to session. LE strentghtening in bed: RLE heelslides x12 AAROM R hip abd/ADD AAROM x 12 Bridge w/3 sec hold x 10 Clamshells w/cues to focus on RLE contrlled movement x 15  Supine to sit using momentum and adhereing to spinal precs w/supervision and use of rail Maintains balance w/supervision while therapist donns shoes. Squat pivot bed to wc w/set up and supervision. wc propulsion x 179ft mod I.  Sit to stand in stedy w/cga.  Stood x attempting quad sets but poor control RLE.  Pt c/o dizzyness.  Returned to sitting in wc.  bp cuff not available immediately, after 5 min bp 96/69, HR 100, noted consistent skipped beat, discussed HR/BP w/Dr. Allena Katz.  Pt provided w/ted hose, donned by therapist.  Pt stated he still felt "a bit off".  wc propulsion x 130ft mod I, transported remainder of distance to dayroom for continued LE strengthening in sitting. Performed kintron 90*/sec  2.5 min x 1, 1.5 min x 1  wc propulsion w/supervision >258ft to room.  Squat pivot wc to bed w/set up and supervision. Min assist to remove  shoes. Sit to supine w/supervision using momentum/adheres to spinal precautions. Pt left supine w/rails up x 3, alarm set, bed in lowest position, and needs in reach.   Therapy Documentation Precautions:  Precautions Precautions: Fall,Back Precaution Comments: General post-op back precautions Restrictions Weight Bearing Restrictions: No    Therapy/Group: Individual Therapy  Rada Hay, PT   [name] 03/10/2020, 12:32 PM

## 2020-03-11 ENCOUNTER — Inpatient Hospital Stay (HOSPITAL_COMMUNITY): Payer: Medicare Other

## 2020-03-11 NOTE — Progress Notes (Signed)
Physical Therapy Session Note  Patient Details  Name: Timothy Ware MRN: [id number] Date of Birth: [birth year]  Today's Date: 03/11/2020 PT Individual Time: 1108-1205 PT Individual Time Calculation (min): 57 min   Short Term Goals: Week 1:  PT Short Term Goal 1 (Week 1): Pt will complete bed mobility with minA PT Short Term Goal 2 (Week 1): Pt will perform bed<>chair with minA and LRAD PT Short Term Goal 3 (Week 1): Pt will ambulate 47ft with minA and LRAD PT Short Term Goal 4 (Week 1): Pt will initiate stair training Week 2:  PT Short Term Goal 1 (Week 2): Pt will complete bed mobility mod I PT Short Term Goal 2 (Week 2): Pt will perform bed<>chair with CGA and LRAD PT Short Term Goal 3 (Week 2): Pt will ambulate 62ft with minA and LRAD Week 3:     Skilled Therapeutic Interventions/Progress Updates:  PAIN pt denies pain    Pt initially supine awaiting therapist for session.  Therapist donned bilat ted hose due to orthostasis in yest session.  Supine to sit w/supervision.  Lateral scoot bed to wc w/set up and supervision. Pt transported to gym for time management.  Seated therex as warm up in prep for standing: Ankle DF x 15 Ankle PF x 15 Marching x 10 partial arom r (increases w/rep), full arom L Sit to stand in parallel bars w/cga Standing in staggar step R/L practiced wt shift forward/back between feet  Gait: 3 steps w/max assist to advance RLE, positioning/prevent scissoring, and to stabilize knee, pt relies heavily on UE support, wc following closely for safety.  Gait 55ft w/min assist to advance RLE, scissors/therapist prevents and assists w/placement at initial contact, stabilizing knee as above, cues for upright posture, short step length and step to gait on L, limited wbing onto RLE w/stance.  wc follow as above  Pt propels wc 167ft to dayroom mod I.  Kinetron x 2 min 90*/sec for LE strength, pt w/urgent bowel needs.  Quickly transported to room. Sit to stand in stedy  w/cga.  Transported to commode and handed off to NT as session timed out.   Therapy Documentation Precautions:  Precautions Precautions: Fall,Back Precaution Comments: General post-op back precautions Restrictions Weight Bearing Restrictions: No    Therapy/Group: Individual Therapy  Rada Hay, PT   [name] 03/11/2020, 12:13 PM

## 2020-03-11 NOTE — Progress Notes (Signed)
[place] PHYSICAL MEDICINE & REHABILITATION PROGRESS NOTE  Subjective/Complaints:   Pt reports no pain- has a little numbness in RLE but slightly better on bottom of R foot  LBM this AM Voiding a lot since a few days ago when had not been able to void. Is hoarse, but no sore throat.    ROS:  Pt denies SOB, abd pain, CP, N/V/C/D, and vision changes   Objective: Vital Signs: Blood pressure 128/78, pulse 68, temperature 98.1 F (36.7 C), resp. rate 14, height 6\' 1"  (1.854 m), weight 87.4 kg, SpO2 99 %. DG THORACOLUMABAR SPINE  Result Date: 03/09/2020 CLINICAL DATA:  Chronic back pain EXAM: THORACOLUMBAR SPINE 1V COMPARISON:  February 25, 2020. FINDINGS: No evidence of acute fracture or traumatic malalignment. S-shaped thoracolumbar curvature. Vertebral body heights are maintained. Posterior fusion hardware spanning L2 through L4 without evidence of hardware complication. There is evidence of some osseous fusion across the disc spaces. There is adjacent level degenerative change at L1-L2 with disc height loss, endplate sclerosis, and endplate spurring. There is also facet hypertrophy at this level. Atherosclerotic calcifications. IMPRESSION: 1. No evidence of acute fracture or traumatic malalignment. 2. L2 through L4 posterior fusion with adjacent level L1-L2 degenerative changes. Electronically Signed   By: February 27, 2020 MD   On: 03/09/2020 15:53   No results for input(s): WBC, HGB, HCT, PLT in the last 72 hours. Recent Labs    03/10/20 0442  NA 136  K 4.3  CL 101  CO2 25  GLUCOSE 113*  BUN 31*  CREATININE 1.36*  CALCIUM 8.4*    Intake/Output Summary (Last 24 hours) at 03/11/2020 1115 Last data filed at 03/11/2020 0700 Gross per 24 hour  Intake 480 ml  Output 1300 ml  Net -820 ml     Physical Exam: BP 128/78   Pulse 68   Temp 98.1 F (36.7 C)   Resp 14   Ht 6\' 1"  (1.854 m)   Wt 87.4 kg   SpO2 99%   BMI 25.42 kg/m  Constitutional: sitting up in bed-  appropriate, NAD HENT: Normocephalic.  Atraumatic. Throat slightly red Eyes: EOMI. No discharge. Cardiovascular: RRR Respiratory: CTA B/L- no W/R/R- good air movement GI: Soft, NT, ND, (+)BS  Skin: Warm and dry. Back incision CDI. Psych: Normal mood.  Normal behavior. Musc: No edema in extremities.  No tenderness in extremities. Neurologic: Alert Bilateral upper extremities: 5/5 proximal distal Left lower extremity: Hip flexion, knee extension 4-4+/5, ankle dorsiflexion 5/5 Right lower extremity: Hip flexion, knee extension 4/5, ankle dorsiflexion 5/5 Sensation diminished to light touch right lower extremity >> left lower extremity, improving  Assessment/Plan: 1. Functional deficits which require 3+ hours per day of interdisciplinary therapy in a comprehensive inpatient rehab setting.  Physiatrist is providing close team supervision and 24 hour management of active medical problems listed below.  Physiatrist and rehab team continue to assess barriers to discharge/monitor patient progress toward functional and medical goals   Care Tool:  Bathing    Body parts bathed by patient: Right arm,Left arm,Chest,Abdomen,Front perineal area,Right upper leg,Left upper leg,Face     Body parts n/a: Right lower leg,Left lower leg   Bathing assist Assist Level: Moderate Assistance - Patient 50 - 74%     Upper Body Dressing/Undressing Upper body dressing   What is the patient wearing?: Pull over shirt    Upper body assist Assist Level: Supervision/Verbal cueing    Lower Body Dressing/Undressing Lower body dressing      What is the patient  wearing?: Pants     Lower body assist Assist for lower body dressing: Moderate Assistance - Patient 50 - 74%     Toileting Toileting    Toileting assist Assist for toileting: Minimal Assistance - Patient > 75% (clothing mgt and pericare simulation only with lateral leans) Assistive Device Comment:  (urinal)   Transfers Chair/bed  transfer  Transfers assist     Chair/bed transfer assist level: Supervision/Verbal cueing     Locomotion Ambulation   Ambulation assist      Assist level: Minimal Assistance - Patient > 75% Assistive device: Parallel bars Max distance: 8 ft   Walk 10 feet activity   Assist  Walk 10 feet activity did not occur: Safety/medical concerns  Assist level: Dependent - Patient 0% Assistive device: Lite Gait   Walk 50 feet activity   Assist Walk 50 feet with 2 turns activity did not occur: Safety/medical concerns         Walk 150 feet activity   Assist Walk 150 feet activity did not occur: Safety/medical concerns         Walk 10 feet on uneven surface  activity   Assist Walk 10 feet on uneven surfaces activity did not occur: Safety/medical concerns         Wheelchair     Assist Will patient use wheelchair at discharge?: Yes Type of Wheelchair: Manual    Wheelchair assist level: Independent Max wheelchair distance: .250    Wheelchair 50 feet with 2 turns activity    Assist        Assist Level: Independent   Wheelchair 150 feet activity     Assist      Assist Level: Independent    Medical Problem List and Plan: 1.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 Status post transpedicular microdiscectomy T10-T11 02/25/2020. No brace required  Continue CIR  Decadron decreased to 4 twice daily on 12/8, decreased to daily on 12/13, plan to decrease further on Monday  Repeat thoracolumbar x-ray on 12/16 stable without malalignment 2. Antithrombotics: -DVT/anticoagulation:SCDs.  Vascular studies reviewed negative for DVT -antiplatelet therapy: Aspirin 81 mg daily   CBC ordered for Monday 3.  Postoperative pain:  12/18- pain controlled- con't regimen as needed  Monitor with increased exertion 4. Mood:Provide emotional support -antipsychotic agents: N/A 5. Neuropsych: This  patientiscapable of making decisions on hisown behalf. 6. Skin/Wound Care:Routine skin checks 7. Fluids/Electrolytes/Nutrition:Routine in and outs. 8. Hypertension.   Continue tenormin 25mg  daily and lotensin 10mg  daily  12/18- BP controlled- 120s/70s- con't regimen 9. CAD with PTCA. Aspirin 81 mg daily. 10. Hyperlipidemia. Lipitor 11.  Hypoalbuminemia  Supplement initiated  12.  Steroid-induced hyperglycemia  Monitor with tapering steroids, mildly elevated on 12/17 13. Bradycardia: Does f/u with cardiology, has an appointment in January.   Controlled on 12/17 14.  Elevated BUN/creatinine  Creatinine 1.36 on 12/17, labs ordered for Monday  Encourage fluids 15. Urinary retention  UA negative  PVRs unremarkable after initial elevated volume  Flomax ordered, changed to nightly due to orthostasis  Improving, continue to monitor  12/18- says no urinary retention since on Flomax- working well- con't regimen 16. Orthostasis-new problem  Likely medication induced, see #15  Continue to monitor  LOS: 12 days A FACE TO FACE EVALUATION WAS PERFORMED  Ahmeer Tuman 03/11/2020, 11:15 AM

## 2020-03-11 NOTE — Progress Notes (Signed)
Patient stated that he felt bloated and felt that he needed to have a BM but nothing was coming out. Requested suppository. Suppository administered and pt had medium BM.

## 2020-03-12 NOTE — Progress Notes (Signed)
[place] PHYSICAL MEDICINE & REHABILITATION PROGRESS NOTE  Subjective/Complaints:  BP same- even though dizzy when he stands up.  Awake 1/2 the night.   Slept fine, though. LBM last night- with suppository.     ROS:   Pt denies SOB, abd pain, CP, N/V/C/D, and vision changes   Objective: Vital Signs: Blood pressure 128/66, pulse 77, temperature 98.1 F (36.7 C), resp. rate 20, height 6\' 1"  (1.854 m), weight 87.4 kg, SpO2 98 %. No results found. No results for input(s): WBC, HGB, HCT, PLT in the last 72 hours. Recent Labs    03/10/20 0442  NA 136  K 4.3  CL 101  CO2 25  GLUCOSE 113*  BUN 31*  CREATININE 1.36*  CALCIUM 8.4*    Intake/Output Summary (Last 24 hours) at 03/12/2020 1201 Last data filed at 03/12/2020 0900 Gross per 24 hour  Intake 510 ml  Output 450 ml  Net 60 ml     Physical Exam: BP 128/66 (BP Location: Right Arm)   Pulse 77   Temp 98.1 F (36.7 C)   Resp 20   Ht 6\' 1"  (1.854 m)   Wt 87.4 kg   SpO2 98%   BMI 25.42 kg/m  Constitutional: laying in bed- appropriate, NAD HENT: Normocephalic.  Atraumatic. Throat slightly red Eyes: EOMI. No discharge. Cardiovascular: RRR Respiratory: CTA B/L- no W/R/R- good air movement GI: .Soft, NT, ND, (+)BS - hypoactive Skin: Warm and dry. Back incision CDI. Psych: appropriate Musc: No edema in extremities.  No tenderness in extremities. Neurologic: Alert Bilateral upper extremities: 5/5 proximal distal Left lower extremity: Hip flexion, knee extension 4-4+/5, ankle dorsiflexion 5/5 Right lower extremity: Hip flexion, knee extension 4/5, ankle dorsiflexion 5/5 Sensation diminished to light touch right lower extremity >> left lower extremity, improving  Assessment/Plan: 1. Functional deficits which require 3+ hours per day of interdisciplinary therapy in a comprehensive inpatient rehab setting.  Physiatrist is providing close team supervision and 24 hour management of active medical problems listed  below.  Physiatrist and rehab team continue to assess barriers to discharge/monitor patient progress toward functional and medical goals   Care Tool:  Bathing    Body parts bathed by patient: Right arm,Left arm,Chest,Abdomen,Front perineal area,Right upper leg,Left upper leg,Face     Body parts n/a: Right lower leg,Left lower leg   Bathing assist Assist Level: Moderate Assistance - Patient 50 - 74%     Upper Body Dressing/Undressing Upper body dressing   What is the patient wearing?: Pull over shirt    Upper body assist Assist Level: Supervision/Verbal cueing    Lower Body Dressing/Undressing Lower body dressing      What is the patient wearing?: Pants     Lower body assist Assist for lower body dressing: Moderate Assistance - Patient 50 - 74%     Toileting Toileting    Toileting assist Assist for toileting: Minimal Assistance - Patient > 75% (clothing mgt and pericare simulation only with lateral leans) Assistive Device Comment:  (urinal)   Transfers Chair/bed transfer  Transfers assist     Chair/bed transfer assist level: Supervision/Verbal cueing     Locomotion Ambulation   Ambulation assist      Assist level: Minimal Assistance - Patient > 75% Assistive device: Parallel bars Max distance: 5   Walk 10 feet activity   Assist  Walk 10 feet activity did not occur: Safety/medical concerns  Assist level: Dependent - Patient 0% Assistive device: Lite Gait   Walk 50 feet activity   Assist  Walk 50 feet with 2 turns activity did not occur: Safety/medical concerns         Walk 150 feet activity   Assist Walk 150 feet activity did not occur: Safety/medical concerns         Walk 10 feet on uneven surface  activity   Assist Walk 10 feet on uneven surfaces activity did not occur: Safety/medical concerns         Wheelchair     Assist Will patient use wheelchair at discharge?: Yes Type of Wheelchair: Manual    Wheelchair  assist level: Independent Max wheelchair distance: .250    Wheelchair 50 feet with 2 turns activity    Assist        Assist Level: Independent   Wheelchair 150 feet activity     Assist      Assist Level: Independent    Medical Problem List and Plan: 1.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 Status post transpedicular microdiscectomy T10-T11 02/25/2020. No brace required  Continue CIR  Decadron decreased to 4 twice daily on 12/8, decreased to daily on 12/13, plan to decrease further on Monday  Repeat thoracolumbar x-ray on 12/16 stable without malalignment 2. Antithrombotics: -DVT/anticoagulation:SCDs.  Vascular studies reviewed negative for DVT -antiplatelet therapy: Aspirin 81 mg daily   CBC ordered for Monday 3.  Postoperative pain:  12/19- pain controlled- con't regimen  Monitor with increased exertion 4. Mood:Provide emotional support -antipsychotic agents: N/A 5. Neuropsych: This patientiscapable of making decisions on hisown behalf. 6. Skin/Wound Care:Routine skin checks 7. Fluids/Electrolytes/Nutrition:Routine in and outs. 8. Hypertension.   Continue tenormin 25mg  daily and lotensin 10mg  daily  12/19- BP 120s/60s- con't regimen 9. CAD with PTCA. Aspirin 81 mg daily. 10. Hyperlipidemia. Lipitor 11.  Hypoalbuminemia  Supplement initiated  12.  Steroid-induced hyperglycemia  Monitor with tapering steroids, mildly elevated on 12/17 13. Bradycardia: Does f/u with cardiology, has an appointment in January.   Controlled on 12/17 14.  Elevated BUN/creatinine  Creatinine 1.36 on 12/17, labs ordered for Monday  Encourage fluids 15. Urinary retention  UA negative  PVRs unremarkable after initial elevated volume  Flomax ordered, changed to nightly due to orthostasis  Improving, continue to monitor  12/18- says no urinary retention since on Flomax- working well- con't regimen 16.  Orthostasis-new problem  Likely medication induced, see #15  Continue to monitor 17. Constipation  12/19- had BM last night with suppository-  18. Dizziness- BP same- no change- so will get vertigo evaluation.   LOS: 13 days A FACE TO FACE EVALUATION WAS PERFORMED  Anasophia Pecor 03/12/2020, 12:01 PM

## 2020-03-13 ENCOUNTER — Inpatient Hospital Stay (HOSPITAL_COMMUNITY): Payer: Medicare Other

## 2020-03-13 ENCOUNTER — Inpatient Hospital Stay (HOSPITAL_COMMUNITY): Payer: Medicare Other | Admitting: Occupational Therapy

## 2020-03-13 DIAGNOSIS — D72829 Elevated white blood cell count, unspecified: Secondary | ICD-10-CM

## 2020-03-13 DIAGNOSIS — D72828 Other elevated white blood cell count: Secondary | ICD-10-CM

## 2020-03-13 LAB — CBC WITH DIFFERENTIAL/PLATELET
Abs Immature Granulocytes: 0.15 10*3/uL — ABNORMAL HIGH (ref 0.00–0.07)
Basophils Absolute: 0 10*3/uL (ref 0.0–0.1)
Basophils Relative: 0 %
Eosinophils Absolute: 0.1 10*3/uL (ref 0.0–0.5)
Eosinophils Relative: 0 %
HCT: 41.4 % (ref 39.0–52.0)
Hemoglobin: 13.9 g/dL (ref 13.0–17.0)
Immature Granulocytes: 1 %
Lymphocytes Relative: 6 %
Lymphs Abs: 1 10*3/uL (ref 0.7–4.0)
MCH: 31.5 pg (ref 26.0–34.0)
MCHC: 33.6 g/dL (ref 30.0–36.0)
MCV: 93.9 fL (ref 80.0–100.0)
Monocytes Absolute: 1.1 10*3/uL — ABNORMAL HIGH (ref 0.1–1.0)
Monocytes Relative: 7 %
Neutro Abs: 14.6 10*3/uL — ABNORMAL HIGH (ref 1.7–7.7)
Neutrophils Relative %: 86 %
Platelets: 155 10*3/uL (ref 150–400)
RBC: 4.41 MIL/uL (ref 4.22–5.81)
RDW: 12.9 % (ref 11.5–15.5)
WBC: 17 10*3/uL — ABNORMAL HIGH (ref 4.0–10.5)
nRBC: 0 % (ref 0.0–0.2)

## 2020-03-13 LAB — BASIC METABOLIC PANEL
Anion gap: 14 (ref 5–15)
BUN: 31 mg/dL — ABNORMAL HIGH (ref 8–23)
CO2: 18 mmol/L — ABNORMAL LOW (ref 22–32)
Calcium: 8.8 mg/dL — ABNORMAL LOW (ref 8.9–10.3)
Chloride: 103 mmol/L (ref 98–111)
Creatinine, Ser: 1.47 mg/dL — ABNORMAL HIGH (ref 0.61–1.24)
GFR, Estimated: 47 mL/min — ABNORMAL LOW (ref >60)
Glucose, Bld: 161 mg/dL — ABNORMAL HIGH (ref 70–99)
Potassium: 4 mmol/L (ref 3.5–5.1)
Sodium: 135 mmol/L (ref 135–145)

## 2020-03-13 MED ORDER — DEXAMETHASONE 4 MG PO TABS
2.0000 mg | ORAL_TABLET | Freq: Every day | ORAL | Status: DC
  Administered 2020-03-14 – 2020-03-16 (×3): 2 mg via ORAL
  Filled 2020-03-13 (×3): qty 1

## 2020-03-13 NOTE — Plan of Care (Signed)
Downgraded gait goal due to limited ability to tolerate standing and gait training in previous week.  Able to resume 12/20 w/Litegait.

## 2020-03-13 NOTE — Progress Notes (Signed)
[place] PHYSICAL MEDICINE & REHABILITATION PROGRESS NOTE  Subjective/Complaints: Patient seen sitting up in the chair working with therapy this morning.  He states he slept well overnight.  He states he had a good weekend.  Discussed dizziness with therapies, which is to be improving today, however will limiting over the weekend.  Discussed changing Flomax timing with patient and therapies as well.  ROS: Denies CP, SOB, N/V/D  Objective: Vital Signs: Blood pressure 127/71, pulse 89, temperature 98.5 F (36.9 C), resp. rate 17, height 6\' 1"  (1.854 m), weight 87.4 kg, SpO2 98 %. No results found. Recent Labs    03/13/20 1103  WBC 17.0*  HGB 13.9  HCT 41.4  PLT 155   Recent Labs    03/13/20 1103  NA 135  K 4.0  CL 103  CO2 18*  GLUCOSE 161*  BUN 31*  CREATININE 1.47*  CALCIUM 8.8*    Intake/Output Summary (Last 24 hours) at 03/13/2020 1225 Last data filed at 03/13/2020 0750 Gross per 24 hour  Intake 480 ml  Output 1800 ml  Net -1320 ml     Physical Exam: BP 127/71   Pulse 89   Temp 98.5 F (36.9 C)   Resp 17   Ht 6\' 1"  (1.854 m)   Wt 87.4 kg   SpO2 98%   BMI 25.42 kg/m  Constitutional: No distress . Vital signs reviewed. HENT: Normocephalic.  Atraumatic. Eyes: EOMI. No discharge. Cardiovascular: No JVD.  RRR. Respiratory: Normal effort.  No stridor.  Bilateral clear to auscultation. GI: Non-distended.  BS +. Skin: Warm and dry.  Neck incision CDI. Psych: Normal mood.  Normal behavior. Musc: No edema in extremities.  No tenderness in extremities. Neurologic: Alert Bilateral upper extremities: 5/5 proximal distal Left lower extremity: Hip flexion, knee extension 4-4+/5, ankle dorsiflexion 5/5 Right lower extremity: Hip flexion, knee extension 4/5, ankle dorsiflexion 5/5 Sensation diminished to light touch right lower extremity >> left lower extremity, improving  Assessment/Plan: 1. Functional deficits which require 3+ hours per day of  interdisciplinary therapy in a comprehensive inpatient rehab setting.  Physiatrist is providing close team supervision and 24 hour management of active medical problems listed below.  Physiatrist and rehab team continue to assess barriers to discharge/monitor patient progress toward functional and medical goals   Care Tool:  Bathing    Body parts bathed by patient: Right arm,Left arm,Chest,Abdomen,Front perineal area,Buttocks,Right upper leg,Left upper leg,Face   Body parts bathed by helper: Left lower leg,Right lower leg Body parts n/a: Right lower leg,Left lower leg   Bathing assist Assist Level: Minimal Assistance - Patient > 75%     Upper Body Dressing/Undressing Upper body dressing   What is the patient wearing?: Pull over shirt    Upper body assist Assist Level: Set up assist    Lower Body Dressing/Undressing Lower body dressing      What is the patient wearing?: Pants     Lower body assist Assist for lower body dressing: Minimal Assistance - Patient > 75%     Toileting Toileting    Toileting assist Assist for toileting: Moderate Assistance - Patient 50 - 74% Assistive Device Comment:  (urinal)   Transfers Chair/bed transfer  Transfers assist     Chair/bed transfer assist level: Supervision/Verbal cueing     Locomotion Ambulation   Ambulation assist      Assist level: Minimal Assistance - Patient > 75% Assistive device: Parallel bars Max distance: 5   Walk 10 feet activity   Assist  Walk  10 feet activity did not occur: Safety/medical concerns  Assist level: Dependent - Patient 0% Assistive device: Lite Gait   Walk 50 feet activity   Assist Walk 50 feet with 2 turns activity did not occur: Safety/medical concerns         Walk 150 feet activity   Assist Walk 150 feet activity did not occur: Safety/medical concerns         Walk 10 feet on uneven surface  activity   Assist Walk 10 feet on uneven surfaces activity did not  occur: Safety/medical concerns         Wheelchair     Assist Will patient use wheelchair at discharge?: Yes Type of Wheelchair: Manual    Wheelchair assist level: Independent Max wheelchair distance: .250    Wheelchair 50 feet with 2 turns activity    Assist        Assist Level: Independent   Wheelchair 150 feet activity     Assist      Assist Level: Independent    Medical Problem List and Plan: 1.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 Status post transpedicular microdiscectomy T10-T11 02/25/2020. No brace required  Continue CIR  Decadron decreased to 4 twice daily on 12/8, decreased to daily on 12/13, wean further on 12/21  Repeat thoracolumbar x-ray on 12/16 stable without malalignment 2. Antithrombotics: -DVT/anticoagulation:SCDs.  Vascular studies reviewed negative for DVT -antiplatelet therapy: Aspirin 81 mg daily   Hemoglobin within normal limits on 12/20 3.  Postoperative pain:  12/19- pain controlled- con't regimen  Monitor with increased exertion 4. Mood:Provide emotional support -antipsychotic agents: N/A 5. Neuropsych: This patientiscapable of making decisions on hisown behalf. 6. Skin/Wound Care:Routine skin checks 7. Fluids/Electrolytes/Nutrition:Routine in and outs. 8. Hypertension.   Continue tenormin 25mg  daily and lotensin 10mg  daily  Controlled on 12/20 9. CAD with PTCA. Aspirin 81 mg daily. 10. Hyperlipidemia. Lipitor 11.  Hypoalbuminemia  Supplement initiated  12.  Steroid-induced hyperglycemia  Elevated on 12/20 13. Bradycardia: Does f/u with cardiology, has an appointment in January.   Controlled on 12/20 14.  Elevated BUN/creatinine, likely CKD  Creatinine 1.47 on 12/20  Encourage fluids 15. Urinary retention  UA negative  PVRs unremarkable after initial elevated volume  Flomax ordered, changed to nightly due to orthostasis  Improving, continue to  monitor  Improving 16. Orthostasis-new problem  Likely medication induced, see #15  Improving  Continue to monitor 17. Constipation  Continue bowel meds 18.  Leukocytosis-likely steroid-induced  WBC 17.0 12/20, labs are for tomorrow  Afebrile  LOS: 14 days A FACE TO FACE EVALUATION WAS PERFORMED  Rosellen Lichtenberger 1/21 03/13/2020, 12:25 PM

## 2020-03-13 NOTE — Progress Notes (Signed)
Pt reported feeling "fuzzy" while looking at surrounding objects. Denies fainting. States objects look "fuzzy" unable to describe further. States he is more weak today than he has been. Vitals assessed. All at baseline. Jesusita Oka, PA made aware of pt's concern. No new orders at this time.   Marylu Lund, RN

## 2020-03-13 NOTE — Progress Notes (Signed)
Physical Therapy Session Note  Patient Details  Name: Timothy Ware MRN: [id number] Date of Birth: 23-Feb-1935  Today's Date: 03/13/2020 PT Individual Time: 1510-1535 PT Individual Time Calculation (min): 25 min   Short Term Goals: Week 3:  PT Short Term Goal 1 (Week 3): Pt will be Independent w/squat pivot transfers to/from bed, will verbalize instructions for  set up assist if needed. PT Short Term Goal 2 (Week 3): mod I wc propulsion on mildly uneven surfaces PT Short Term Goal 3 (Week 3): Pt will ambulate 61ft w/LRAD and mod assist, wc follow  Skilled Therapeutic Interventions/Progress Updates:     Patient in bed upon PT arrival. Patient alert and agreeable to PT session. Patient denied pain during session. Patient reported low BP reading prior to therapy session. Also, stated that he feels weaker today and "shaky" with mobility. Patient reported "cloudy" vision and a general feeling of "woozyness" in sitting and standing during session. Denied dizziness or feeling the room was spinning. Patient denied ever feeling this before. Asked about medication changes the MD discussed this morning. Provided patient with information from his chart about med changes and encouraged him to discuss with medical team with further questions. Donned B TED hose prior to mobility with total A for BP management and focused session on assessment of orthostatic vitals.  Orthostatic Vitals Supine: BP 137/64, HR 74 (asymptomatic) Sitting: BP 132/62, HR 78 (symptomatic, see above) Standing: BP 128/80, HR 106 (symptomatic, see above)   Therapeutic Activity: Bed Mobility: Patient performed supine to/from sit with supervision in a flat bed without use of bed rails. Provided verbal cues for completing roll to side-lying before coming to sitting for improved forward weight shift and reduced effort with mobility as well as to maintain spinal precautions. Donned B tennis shoes with max A with patient sitting EOB. Patient  doffed shoes with min cues EOB after mobiltiy. He performed lateral scooting L x3 for improved positioning before returning to supine with cues for head-hips relationship to improved hip clearance. Transfers: Patient performed sit to/from stand x1 with min A to facilitate forward weight shift. Provided verbal cues for scooting forward, checking foot placement, hand placement, and forward weight shift. Patient stood >1 min with min A-CGA for balance with cues for erect posture, increased gluteal activation, and R knee and hip extension while in standing.  Patient in bed at end of session with breaks locked, bed alarm set, and all needs within reach.    Therapy Documentation Precautions:  Precautions Precautions: Fall,Back Precaution Comments: General post-op back precautions Restrictions Weight Bearing Restrictions: No    Therapy/Group: Individual Therapy  Melesio Madara L Dashawn Golda PT, DPT  03/13/2020, 8:00 PM

## 2020-03-13 NOTE — Progress Notes (Signed)
Occupational Therapy Session Note  Patient Details  Name: Timothy Ware MRN: [id number] Date of Birth: [birth year]  Today's Date: 03/13/2020 OT Individual Time: 1000-1100 ;  And 7858-8502 OT Individual Time Calculation (min): 60 min ; and 30 min   Short Term Goals: Week 2:  OT Short Term Goal 1 (Week 2): Pt will complete toilet transfer with CGA. OT Short Term Goal 2 (Week 2): Pt will be able to manage clothing over his hips at toilet with min A. OT Short Term Goal 3 (Week 2): Pt will be able to don pants with min A.  Skilled Therapeutic Interventions/Progress Updates:    First session: Pt sitting up reporting his head feels a little "swimmy", however agreeable to OT session.  No c/o pain.  Pt self propelled to bathroom and completed squat pivot 3 in 1 commode transfer with CGA.  Clothing mgt completed with mod assist. Pt had continent episode of urine.  Clothing mgt completed with mod assist due to pt fatiguing after effort to pull over hips using lateral leans.  Pt completed squat pivot needing min assist due to pulling on w/c armrest and slightly tipping.  Pt re-educated on safe hand placement during squat pivots.  Pt self propelled to sink and completed oral hygiene and shaved with setup.  Pt then completed squat pivot w/c to EOB with CGA and side scoot to left needing step by step VCs for body mechanics and sequencing.  Pt completed UB bathing and dressing with supervision.  Pt completed LB bathing and dressing with min assist to wash bilateral feet sitting EOB and to thread pants over RLE with LB dressing completed in supine.  Pt educated on benefits of using long handled sponge and reacher to increase independence with self care however pt reports his wife will just help him with these tasks and he is not interested in using AE.  Pt returned to supine using bed rail needing VCs for safe body mechanics.  Call bell in reach, bed alarm on.   Second session:Pt sitting up in bed with dtr present  for session.  Pt reports continues to feel "foggy and swimmy in the head".  OT assessed BP and noted WNL of 127/74 and pulse 72.  Made nursing aware of pts symptoms.  No c/o pain.  Pt completed blocked training squat pivot transfers EOB<>w/c requiring CGA throughout and requiring mod VCs for safe BLE and BUE placement.  Pt completed side scoot to right with supervision needing min VCs for body mechanics.  Sit to supine with supervision.  Call bell in reach, pt left with nurse tech for bladder scan.     Therapy Documentation Precautions:  Precautions Precautions: Fall,Back Precaution Comments: General post-op back precautions Restrictions Weight Bearing Restrictions: No   Therapy/Group: Individual Therapy  Amie Critchley 03/13/2020, 12:08 PM

## 2020-03-13 NOTE — Progress Notes (Signed)
Physical Therapy Session Note  Patient Details  Name: Timothy Ware MRN: [id number] Date of Birth: Feb 04, 1935  Today's Date: 03/13/2020 PT Individual Time: 0800-0900 PT Individual Time Calculation (min): 60 min   Short Term Goals: Week 1:  PT Short Term Goal 1 (Week 1): Pt will complete bed mobility with minA PT Short Term Goal 2 (Week 1): Pt will perform bed<>chair with minA and LRAD PT Short Term Goal 3 (Week 1): Pt will ambulate 24ft with minA and LRAD PT Short Term Goal 4 (Week 1): Pt will initiate stair training Week 2:  PT Short Term Goal 1 (Week 2): Pt will complete bed mobility mod I PT Short Term Goal 2 (Week 2): Pt will perform bed<>chair with CGA and LRAD PT Short Term Goal 3 (Week 2): Pt will ambulate 71ft with minA and LRAD Week 3:     Skilled Therapeutic Interventions/Progress Updates:  PAIN denies pain  Pt initially supine and awaiting therapist.  Noted no wc in room, pt states NT removed it from room to transport wife yesterday.  Therapist  provided wc w/jay back and 3in contoured foam cushion to facilitate Sit to stand.  Supine therex including: TKEs x 15 AAROM RLE/AROM LLE - Heel slides SLR Hip abd/add Hip IR/ER Therapist then donned ted hose for time management. Supine to sit mod I, no evidence noted of vestibular signs w/transitions.  Bed to wc lateral scoot/squat pvt w/set up and supervision. wc propulsion 116ft mod I,, pt stated wc more comfortable and supportive than previous.  Lite gait - pt repeated Sit to stand mult times  for donning and removing harness w/cga and use of frame.  Tolerated standing >3 min with minimal c/o dizzyness today.  Lite gait 20 ft  cues for upright, able to advance RLE in ataxic manner with some scissoring, can correct partially w/cues, occasionally misses step on R/takes 2 on L but aware of this.  Would benefit from continued gait training in LiteGait due to  poor proprioception/risk of falls  wc propulsion 138ft mod I to  room. Pt left oob in wc w/alarm belt set and needs in reach  Therapy Documentation Precautions:  Precautions Precautions: Fall,Back Precaution Comments: General post-op back precautions Restrictions Weight Bearing Restrictions: No   Therapy/Group: Individual Therapy  Rada Hay, PT   [name] 03/13/2020, 11:02 AM

## 2020-03-13 NOTE — Progress Notes (Signed)
Patient ID: Timothy Ware, male   DOB: 1934/12/27, 85 y.o.   MRN: [id number] Reached out to [facility] home health and they can take his referral for PT & OT. Have faxed information to facility. Will update closer to discharge date.

## 2020-03-13 NOTE — Progress Notes (Signed)
Physical Therapy Weekly Progress Note  Patient Details  Name: Timothy Ware MRN: [id number] Date of Birth: 06/21/1934  Beginning of progress report period: March 06, 2020 End of progress report period: March 13, 2020  Today's Date: 03/13/2020  Patient has met 2 of 3 short term goals.  Pt was limited w/upright mobility for several days this week due to dizzyness upon standing.  He was able to tolerate resumption of gait training on 12/20 and wil continue w/this as tolerated by pt.  Patient continues to demonstrate the following deficits muscle weakness and muscle joint tightness, decreased cardiorespiratoy endurance, unbalanced muscle activation and decreased coordination, undetermined dizziness in standing and decreased sitting balance, decreased standing balance, decreased postural control, decreased balance strategies and difficulty maintaining precautions and therefore will continue to benefit from skilled PT intervention to increase functional independence with mobility.  Patient not progressing toward long term goals.  See goal revision..  Plan of care revisions: include downgrade in LT gait goal as he had limited ability w/standing due to c/o dizzyness..  Pt has made excellent progress towards all other goals.  PT Short Term Goals Week 1:  PT Short Term Goal 1 (Week 1): STG=LTGs PT Short Term Goal 2 (Week 1): Pt will perform bed<>chair with minA and LRAD PT Short Term Goal 3 (Week 1): Pt will ambulate 27f with minA and LRAD PT Short Term Goal 4 (Week 1): Pt will initiate stair training Week 2:  PT Short Term Goal 1 (Week 2): Pt will complete bed mobility mod I PT Short Term Goal 1 - Progress (Week 2): Met PT Short Term Goal 2 (Week 2): Pt will perform bed<>chair with CGA and LRAD PT Short Term Goal 2 - Progress (Week 2): Met PT Short Term Goal 3 (Week 2): Pt will ambulate 277fwith minA and LRAD PT Short Term Goal 3 - Progress (Week 2): Partly met Week 3:  PT Short Term Goal 1  (Week 3): Pt will be Independent w/squat pivot transfers to/from bed, will verbalize instructions for  set up assist if needed. PT Short Term Goal 2 (Week 3): mod I wc propulsion on mildly uneven surfaces PT Short Term Goal 3 (Week 3): Pt will ambulate 1574f/LRAD and mod assist, wc follow    Therapy Documentation Precautions:  Precautions Precautions: Fall,Back Precaution Comments: General post-op back precautions Restrictions Weight Bearing Restrictions: No   Therapy/Group: Individual Therapy  BarJerrilyn Cairo12/20/2021, 3:49 PM

## 2020-03-14 ENCOUNTER — Inpatient Hospital Stay (HOSPITAL_COMMUNITY): Payer: Medicare Other | Admitting: Physical Therapy

## 2020-03-14 ENCOUNTER — Inpatient Hospital Stay (HOSPITAL_COMMUNITY): Payer: Medicare Other | Admitting: Occupational Therapy

## 2020-03-14 DIAGNOSIS — D62 Acute posthemorrhagic anemia: Secondary | ICD-10-CM

## 2020-03-14 DIAGNOSIS — D696 Thrombocytopenia, unspecified: Secondary | ICD-10-CM

## 2020-03-14 LAB — CBC WITH DIFFERENTIAL/PLATELET
Abs Immature Granulocytes: 0.06 10*3/uL (ref 0.00–0.07)
Basophils Absolute: 0 10*3/uL (ref 0.0–0.1)
Basophils Relative: 0 %
Eosinophils Absolute: 0.1 10*3/uL (ref 0.0–0.5)
Eosinophils Relative: 1 %
HCT: 37.4 % — ABNORMAL LOW (ref 39.0–52.0)
Hemoglobin: 12.5 g/dL — ABNORMAL LOW (ref 13.0–17.0)
Immature Granulocytes: 1 %
Lymphocytes Relative: 17 %
Lymphs Abs: 1.7 10*3/uL (ref 0.7–4.0)
MCH: 31.8 pg (ref 26.0–34.0)
MCHC: 33.4 g/dL (ref 30.0–36.0)
MCV: 95.2 fL (ref 80.0–100.0)
Monocytes Absolute: 0.7 10*3/uL (ref 0.1–1.0)
Monocytes Relative: 7 %
Neutro Abs: 7.8 10*3/uL — ABNORMAL HIGH (ref 1.7–7.7)
Neutrophils Relative %: 74 %
Platelets: 125 10*3/uL — ABNORMAL LOW (ref 150–400)
RBC: 3.93 MIL/uL — ABNORMAL LOW (ref 4.22–5.81)
RDW: 13 % (ref 11.5–15.5)
WBC: 10.5 10*3/uL (ref 4.0–10.5)
nRBC: 0 % (ref 0.0–0.2)

## 2020-03-14 NOTE — Progress Notes (Signed)
Timothy Ware  Subjective/Complaints: Patient seen laying in bed this morning.  He states he slept well overnight.  Nursing concerned about "fogginess" overnight.  Patient also states that he was foggy for a short period of time but it self resolved and he feels better this morning than he has felt in a long time.  ROS: Denies CP, SOB, N/V/D  Objective: Vital Signs: Blood pressure 130/66, pulse 76, temperature 97.8 F (36.6 C), resp. rate 18, height 6\' 1"  (1.854 m), weight 84.5 kg, SpO2 100 %. No results found. Recent Labs    03/13/20 1103 03/14/20 0425  WBC 17.0* 10.5  HGB 13.9 12.5*  HCT 41.4 37.4*  PLT 155 125*   Recent Labs    03/13/20 1103  NA 135  K 4.0  CL 103  CO2 18*  GLUCOSE 161*  BUN 31*  CREATININE 1.47*  CALCIUM 8.8*    Intake/Output Summary (Last 24 hours) at 03/14/2020 0908 Last data filed at 03/14/2020 0754 Gross per 24 hour  Intake 1204 ml  Output 650 ml  Net 554 ml     Physical Exam: BP 130/66 (BP Location: Right Arm)   Pulse 76   Temp 97.8 F (36.6 C)   Resp 18   Ht 6\' 1"  (1.854 m)   Wt 84.5 kg   SpO2 100%   BMI 24.57 kg/m  Constitutional: No distress . Vital signs reviewed. HENT: Normocephalic.  Atraumatic. Eyes: EOMI. No discharge. Cardiovascular: No JVD.  RRR. Respiratory: Normal effort.  No stridor.  Bilateral clear to auscultation. GI: Non-distended.  BS +. Skin: Warm and dry.  Incision CDI Psych: Normal mood.  Normal behavior. Musc: No edema in extremities.  No tenderness in extremities. Neurologic: Alert Bilateral upper extremities: 5/5 proximal distal Left lower extremity: Hip flexion, knee extension 4-4+/5, ankle dorsiflexion 5/5, unchanged Right lower extremity: Hip flexion, knee extension 4/5, ankle dorsiflexion 5/5, unchanged Sensation diminished to light touch right lower extremity >> left lower extremity, improving  Assessment/Plan: 1. Functional deficits which require  3+ hours per day of interdisciplinary therapy in a comprehensive inpatient rehab setting.  Physiatrist is providing close team supervision and 24 hour management of active medical problems listed below.  Physiatrist and rehab team continue to assess barriers to discharge/monitor patient progress toward functional and medical goals   Care Tool:  Bathing    Body parts bathed by patient: Right arm,Left arm,Chest,Abdomen,Front perineal area,Buttocks,Right upper leg,Left upper leg,Face   Body parts bathed by helper: Left lower leg,Right lower leg Body parts n/a: Right lower leg,Left lower leg   Bathing assist Assist Level: Minimal Assistance - Patient > 75%     Upper Body Dressing/Undressing Upper body dressing   What is the patient wearing?: Pull over shirt    Upper body assist Assist Level: Set up assist    Lower Body Dressing/Undressing Lower body dressing      What is the patient wearing?: Pants     Lower body assist Assist for lower body dressing: Minimal Assistance - Patient > 75%     Toileting Toileting    Toileting assist Assist for toileting: Moderate Assistance - Patient 50 - 74% Assistive Device Comment:  (urinal)   Transfers Chair/bed transfer  Transfers assist     Chair/bed transfer assist level: Supervision/Verbal cueing     Locomotion Ambulation   Ambulation assist      Assist level: Minimal Assistance - Patient > 75% Assistive device: Parallel bars Max distance: 5   Walk  10 feet activity   Assist  Walk 10 feet activity did not occur: Safety/medical concerns  Assist level: Dependent - Patient 0% Assistive device: Lite Gait   Walk 50 feet activity   Assist Walk 50 feet with 2 turns activity did not occur: Safety/medical concerns         Walk 150 feet activity   Assist Walk 150 feet activity did not occur: Safety/medical concerns         Walk 10 feet on uneven surface  activity   Assist Walk 10 feet on uneven  surfaces activity did not occur: Safety/medical concerns         Wheelchair     Assist Will patient use wheelchair at discharge?: Yes Type of Wheelchair: Manual    Wheelchair assist level: Independent Max wheelchair distance: .250    Wheelchair 50 feet with 2 turns activity    Assist        Assist Level: Independent   Wheelchair 150 feet activity     Assist      Assist Level: Independent    Medical Problem List and Plan: 1.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 Status post transpedicular microdiscectomy T10-T11 02/25/2020. No brace required  Continue CIR  Decadron decreased to 4 twice daily on 12/8, decreased to daily on 12/13, decreased further on 12/21  Repeat thoracolumbar x-ray on 12/16 stable without malalignment 2. Antithrombotics: -DVT/anticoagulation:SCDs.  Vascular studies reviewed negative for DVT -antiplatelet therapy: Aspirin 81 mg daily   Hemoglobin within normal limits on 12/20 3.  Postoperative pain:  12/19- pain controlled- con't regimen  Monitor with increased exertion 4. Mood:Provide emotional support -antipsychotic agents: N/A 5. Neuropsych: This patientiscapable of making decisions on hisown behalf. 6. Skin/Wound Care:Routine skin checks 7. Fluids/Electrolytes/Nutrition:Routine in and outs. 8. Hypertension.   Continue tenormin 25mg  daily and lotensin 10mg  daily  Controlled on 12/21 9. CAD with PTCA. Aspirin 81 mg daily. 10. Hyperlipidemia. Lipitor 11.  Hypoalbuminemia  Supplement initiated  12.  Steroid-induced hyperglycemia  Elevated on 12/20 13. Bradycardia: Does f/u with cardiology, has an appointment in January.   Controlled on 12/21 14.  Elevated BUN/creatinine, likely CKD  Creatinine 1.47 on 12/20  Encourage fluids 15. Urinary retention  UA negative  PVRs unremarkable after initial elevated volume  Flomax ordered, changed to nightly due to  orthostasis  Improving, continue to monitor  Improved 16. Orthostasis-new problem  Likely medication induced, see #15  Improving  Continue to monitor 17. Constipation  Continue bowel meds 18.  Leukocytosis-likely steroid-induced  WBC 10.5 on 12/21, labs ordered for tomorrow  Afebrile 19.  Acute blood loss anemia  Hemoglobin 12.5 on 12/21, labs ordered for tomorrow 20.  Thrombocytopenia  Platelets 125 on 12/21, labs ordered for tomorrow  LOS: 15 days A FACE TO FACE EVALUATION WAS PERFORMED  Timothy Ware 1/22 03/14/2020, 9:08 AM

## 2020-03-14 NOTE — Progress Notes (Signed)
Occupational Therapy Session Note  Patient Details  Name: Timothy Ware MRN: [id number] Date of Birth: [birth year]  Today's Date: 03/14/2020 OT Individual Time:  -  1110-1205   Total Time- 55 min    Short Term Goals: Week 2:  OT Short Term Goal 1 (Week 2): Pt will complete toilet transfer with CGA. OT Short Term Goal 2 (Week 2): Pt will be able to manage clothing over his hips at toilet with min A. OT Short Term Goal 3 (Week 2): Pt will be able to don pants with min A.  Skilled Therapeutic Interventions/Progress Updates:    Pt semi upright in bed, with dtr present for session.  Focus of treatment on dc planning and squat pivot functional transfer training.  Per dtr, bought a BSC but unsure if it is drop arm; reports she will check and let OT know.  Educated dtr regarding pts current functional status. Pt completed supine to sit with CGA using bed rail.  Pt donned shoes with increased time using figure 4 position and supervision.  Pt completed blocked training squat pivot transfers from various surfaces including EOB<>drop arm BSC, EOB<>w/c, w/c<>drop arm BSC.  Pt required CGA throughout, demonstrating improved safety awareness and body mechanics.  Pt requesting back to bed at end of session completing sit to supine with supervision.  Call bell in reach, bed alarm on.  Therapy Documentation Precautions:  Precautions Precautions: Fall,Back Precaution Comments: General post-op back precautions Restrictions Weight Bearing Restrictions: No   Therapy/Group: Individual Therapy  Amie Critchley 03/14/2020, 4:57 PM

## 2020-03-14 NOTE — Progress Notes (Signed)
Patient ID: Timothy Ware, male   DOB: Jan 28, 1935, 85 y.o.   MRN: [id number]      Diagnosis codes: M51.04 & M47.14  Height:  6'1              Weight:  192 lbs          Patient suffers from bilateral lower extremities weakness   which impairs their ability to perform daily activities like ADL's   in the home.  A rolling walker  will not resolve issue with performing activities of daily living.  A wheelchair will allow patient to safely perform daily activities.  Patient is not able to propel themselves in the home using a standard weight wheelchair due to endurance and arm weakness .  Patient can self propel in the lightweight wheelchair.

## 2020-03-14 NOTE — Progress Notes (Signed)
Physical Therapy Session Note  Patient Details  Name: Timothy Ware MRN: [id number] Date of Birth: [birth year]  Today's Date: 03/14/2020 PT Individual Time: 0923-1020 and 7564-3329 PT Individual Time Calculation (min): 57 min and 77 min  Short Term Goals: Week 3:  PT Short Term Goal 1 (Week 3): Pt will be Independent w/squat pivot transfers to/from bed, will verbalize instructions for  set up assist if needed. PT Short Term Goal 2 (Week 3): mod I wc propulsion on mildly uneven surfaces PT Short Term Goal 3 (Week 3): Pt will ambulate 54ft w/LRAD and mod assist, wc follow  Skilled Therapeutic Interventions/Progress Updates:    Session 1: Pt received supine in bed with RN present for medication administration and pt agreeable to therapy session. Supine>sitting L EOB, HOB flat and bedrails removed, with max cuing for logroll technique and heavy min assist for trunk upright with pt using B UE support for R LE management. Sitting EOB, donned shoes max assist. R squat pivot EOB>w/c min assist for lifting/pivoting hips - pt places R hand in w/c seat to avoid pulling unsafely on w/c armrest. B UE w/c propulsion ~281ft to day room mod-I - demos good propulsion technique. Pt able to manage w/c parts with only set-up assist for leg rest management to avoid pt bending down due to spine precautions. Sit<>stands to/from B UE support on litegait x3 trials with mod assist for lifting/lowering and blocking R knee to prevent buckling while therapist donned litegait harness total assist. Gait training ~36ft, ~73ft in litegait harness for partial BWS with therapist primarily assisting with litegait management and intermittently assisting with R LE swing phase advancement (no +2 assist available) - pt noted to be pulling strongly on litegait handle due to posterior lean with decreased hip extension, cuing for improvement; pt able to advance B LEs but demonstrates significant B LE ataxia with scissoring causing narrow BOS,  cuing for improvement. Doffed litegait harness as described above. B UE w/c propulsion ~248ft back to room mod-I and pt requesting to return to bed. L squat pivot w/c>EOB with CGA for steadying/safety. Sit>supine with min assist for R LE management onto bed and cuing again for reverse logroll technique but pt demonstrating poor understanding and poor recall of this education. Pt left supine in bed with needs in reach and bed alarm on.    Session 2: Pt received supine in bed with his daughter, Elnita Maxwell, present and pt agreeable to therapy session.  Educated pt/family on the following:  - recommendation for custom wheelchair re-consultation as pt will not be a functional ambulator upon discharge and need for pressure relieving cushion as pt has decreased sensation on buttocks and LEs, pt in agreement with this plan.  - importance of being upright, OOB during the day to improve endurance and allow increased independence with mobility at wheelchair level -  re-arranging items in the home so that pt may reach them safely from a wheelchair level - need for pt to transfer to a sedan height vehicle as he will likely need to perform squat pivot transfer - importance of pressure relieving for a full 30 seconds every 30 minutes via wheelchair push-up (recommend using pressure mapping system to determine if an alternative pressure relief technique may be more appropriate) Pt/family verbalizes understanding of all education. Supine>sitting L EOB, HOB flat but pt using bedrails on opposite side of the bed, to quickly coming up to sitting with supervision demonstrating lack of recall/carryover of logroll technique with poor understanding of spinal precautions -  reinforced education and importance with decreased understanding of significance. Sitting EOB donned shoes max assist for time. R squat pivot EOB>w/c with CGA for safety - places R hand in seat as opposed to on armrest to prevent him from pulling on w/c too  strongly making the transfer unsafe. B UE w/c propulsion ~172ft to main therapy gym mod-I and managing w/c leg rests with set-up assist to prevent him from bending down.   Gait training at L hallway rail ~90ft, ~1ft, ~71ft with heavy max assist of 1 for balance, blocking R knee, and strongly assisting with increased hip extension - +2 very close w/c follow - demos lack of full hip extension causing flexed posture with strong posterior lean, improved with cuing at front of L hip to bring it forward and for increased glute activation; B LE ataxia with scissoring gait, improved with cuing to keep L foot on red tile and R foot on white tile - requires max/total assist for R LE advancement during swing and to reposition due to excessive adduction (therapist using leg lifter to assist with this) - requires max/total assist for R knee control during stance to prevent buckling.  Gait training ~35ft using 3 Musketeer support with heavy max assist of 1 and + mod assist - continues to demo above gait impairments and require max/total assist for R LE management; however, some improved upright posture due to pt not having to lean down to reach hallway railing; however, also noticed to have more impaired B LE ataxia with worse scissoring (felt hallway rail provided increased stability compared to 3 Musketeer). During this gait trail pt started to report strong R lower flank pain, anticipate may be due to radiculopathy from the spine because of the improved upright posture/trunk extension, but unable to determine - pt immediately reports pain dissipates upon sitting down. Attempted to ambulate a 5th time at L hallway rail; however, immediate onset of the R flank pain upon coming to stand therefore discontinued.  Therapist retrieved a Roho cushion and inflated it to trial during tomorrows session for pressure mapping. Pt performed ~215ft of B UE w/c propulsion mod-I. Pt left sitting in w/c with needs in reach and seat belt  alarm on.    Therapy Documentation Precautions:  Precautions Precautions: Fall,Back Precaution Comments: General post-op back precautions Restrictions Weight Bearing Restrictions: No  Pain:   Session 1: No reports of pain throughout session.  Session 2: Reports onset of R lower flank pain during gait training with 3 Musketeer support - immediate pain relief upon sitting down in w/c but pain immediately returned upon coming to stand, discontinued any further standing/gait training. Will need to continue monitoring in future sessions.   Therapy/Group: Individual Therapy  Ginny Forth , PT, DPT, CSRS  03/14/2020, 7:55 AM

## 2020-03-15 ENCOUNTER — Inpatient Hospital Stay (HOSPITAL_COMMUNITY): Payer: Medicare Other | Admitting: Physical Therapy

## 2020-03-15 ENCOUNTER — Inpatient Hospital Stay (HOSPITAL_COMMUNITY): Payer: Medicare Other | Admitting: Occupational Therapy

## 2020-03-15 LAB — CBC WITH DIFFERENTIAL/PLATELET
Abs Immature Granulocytes: 0.06 10*3/uL (ref 0.00–0.07)
Basophils Absolute: 0 10*3/uL (ref 0.0–0.1)
Basophils Relative: 0 %
Eosinophils Absolute: 0.1 10*3/uL (ref 0.0–0.5)
Eosinophils Relative: 1 %
HCT: 36.8 % — ABNORMAL LOW (ref 39.0–52.0)
Hemoglobin: 12.8 g/dL — ABNORMAL LOW (ref 13.0–17.0)
Immature Granulocytes: 1 %
Lymphocytes Relative: 16 %
Lymphs Abs: 1.6 10*3/uL (ref 0.7–4.0)
MCH: 32.7 pg (ref 26.0–34.0)
MCHC: 34.8 g/dL (ref 30.0–36.0)
MCV: 93.9 fL (ref 80.0–100.0)
Monocytes Absolute: 0.6 10*3/uL (ref 0.1–1.0)
Monocytes Relative: 7 %
Neutro Abs: 7.4 10*3/uL (ref 1.7–7.7)
Neutrophils Relative %: 75 %
Platelets: 121 10*3/uL — ABNORMAL LOW (ref 150–400)
RBC: 3.92 MIL/uL — ABNORMAL LOW (ref 4.22–5.81)
RDW: 13.1 % (ref 11.5–15.5)
WBC: 9.8 10*3/uL (ref 4.0–10.5)
nRBC: 0 % (ref 0.0–0.2)

## 2020-03-15 NOTE — Plan of Care (Signed)
  Problem: RH Bathing Goal: LTG Patient will bathe all body parts with assist levels (OT) Description: LTG: Patient will bathe all body parts with assist levels (OT) Flowsheets (Taken 03/15/2020 1655) LTG: Position pt will perform bathing:  Edge of bed  Supine in bed   Problem: RH Toileting Goal: LTG Patient will perform toileting task (3/3 steps) with assistance level (OT) Description: LTG: Patient will perform toileting task (3/3 steps) with assistance level (OT)  Flowsheets (Taken 03/15/2020 1655) LTG: Pt will perform toileting task (3/3 steps) with assistance level: Minimal Assistance - Patient > 75%   Problem: RH Toilet Transfers Goal: LTG Patient will perform toilet transfers w/assist (OT) Description: LTG: Patient will perform toilet transfers with assist, with/without cues using equipment (OT) Flowsheets (Taken 03/15/2020 1655) LTG: Pt will perform toilet transfers with assistance level of: Supervision/Verbal cueing   Problem: RH Bathing Goal: LTG Patient will bathe all body parts with assist levels (OT) Description: LTG: Patient will bathe all body parts with assist levels (OT) Flowsheets (Taken 03/15/2020 1655) LTG: Position pt will perform bathing:  Edge of bed  Supine in bed   Problem: RH Toileting Goal: LTG Patient will perform toileting task (3/3 steps) with assistance level (OT) Description: LTG: Patient will perform toileting task (3/3 steps) with assistance level (OT)  Flowsheets (Taken 03/15/2020 1655) LTG: Pt will perform toileting task (3/3 steps) with assistance level: Minimal Assistance - Patient > 75%   Problem: RH Toilet Transfers Goal: LTG Patient will perform toilet transfers w/assist (OT) Description: LTG: Patient will perform toilet transfers with assist, with/without cues using equipment (OT) Flowsheets (Taken 03/15/2020 1655) LTG: Pt will perform toilet transfers with assistance level of: Supervision/Verbal cueing   Problem: RH Dressing Goal: LTG  Patient will perform lower body dressing w/assist (OT) Description: LTG: Patient will perform lower body dressing with assist, with/without cues in positioning using equipment (OT) Flowsheets (Taken 03/15/2020 1658) LTG: Pt will perform lower body dressing with assistance level of: Supervision/Verbal cueing

## 2020-03-15 NOTE — Progress Notes (Signed)
Occupational Therapy Weekly Progress Note  Patient Details  Name: Timothy Ware MRN: [id number] Date of Birth: 21-Dec-1934  Beginning of progress report period: March 09, 2020 End of progress report period: March 15, 2020  Today's Date: 03/15/2020 OT Individual Time: 1440-1550 OT Individual Time Calculation (min): 70 min    Patient has met 2 of 3 short term goals.  Pt has shown improved independence with LB bathing and dressing from mod/max assist to minA/CGA, utilizing compensatory techniques downgraded to EOB and bed level due to pt's RLE weakness and impaired sensation limits ability to safely complete at sit<>stand level.  Pt requires repetitive training to facilitate improved carryover of BUE and BLE placement and body mechanics to complete safe squat pivot functional transfers but has improved from min A to CGA with appropriate VCs provided.   Pt will be unable to access bathroom at home at w/c level due to door width and inability to complete stand pivots due to RLE weakness, therefore pt requires drop arm commode to safely complete squat pivot toilet transfers. Pt is progressing towards increased independence during toileting using compensatory technique with lateral leaning at [facility] level, however still requires mod assist for clothing mgt at this time.  Mild redness noted over sacrum and left buttocks, with decreased sensation per pt, therefore education completed regarding pressure reducing strategies.  Pt would benefit from further training to increase carryover of skills.    Patient continues to demonstrate the following deficits: muscle paralysis, abnormal tone and decreased motor planning and decreased sitting balance, decreased standing balance and decreased balance strategies and therefore will continue to benefit from skilled OT intervention to enhance overall performance with BADL.  Patient progressing toward long term goals..  Plan of care revisions: below:.    Problem: RH  Bathing Goal: LTG Patient will bathe all body parts with assist levels (OT) Description: LTG: Patient will bathe all body parts with assist levels (OT) Flowsheets (Taken 03/15/2020 1655) LTG: Position pt will perform bathing: . Edge of bed . Supine in bed   Problem: RH Toileting Goal: LTG Patient will perform toileting task (3/3 steps) with assistance level (OT) Description: LTG: Patient will perform toileting task (3/3 steps) with assistance level (OT)  Flowsheets (Taken 03/15/2020 1655) LTG: Pt will perform toileting task (3/3 steps) with assistance level: Minimal Assistance - Patient > 75%   Problem: RH Toilet Transfers Goal: LTG Patient will perform toilet transfers w/assist (OT) Description: LTG: Patient will perform toilet transfers with assist, with/without cues using equipment (OT) Flowsheets (Taken 03/15/2020 1655) LTG: Pt will perform toilet transfers with assistance level of: Supervision/Verbal cueing   Problem: RH Bathing Goal: LTG Patient will bathe all body parts with assist levels (OT) Description: LTG: Patient will bathe all body parts with assist levels (OT) Flowsheets (Taken 03/15/2020 1655) LTG: Position pt will perform bathing: . Edge of bed . Supine in bed   Problem: RH Toileting Goal: LTG Patient will perform toileting task (3/3 steps) with assistance level (OT) Description: LTG: Patient will perform toileting task (3/3 steps) with assistance level (OT)  Flowsheets (Taken 03/15/2020 1655) LTG: Pt will perform toileting task (3/3 steps) with assistance level: Minimal Assistance - Patient > 75%   Problem: RH Toilet Transfers Goal: LTG Patient will perform toilet transfers w/assist (OT) Description: LTG: Patient will perform toilet transfers with assist, with/without cues using equipment (OT) Flowsheets (Taken 03/15/2020 1655) LTG: Pt will perform toilet transfers with assistance level of: Supervision/Verbal cueing   Problem: RH Dressing Goal: LTG Patient  will perform lower body dressing w/assist (OT) Description: LTG: Patient will perform lower body dressing with assist, with/without cues in positioning using equipment (OT) Flowsheets (Taken 03/15/2020 1658) LTG: Pt will perform lower body dressing with assistance level of: Supervision/Verbal cueing  OT Short Term Goals Week 2:  OT Short Term Goal 1 (Week 2): Pt will complete toilet transfer with CGA. OT Short Term Goal 1 - Progress (Week 2): Met OT Short Term Goal 2 (Week 2): Pt will be able to manage clothing over his hips at toilet with min A. OT Short Term Goal 2 - Progress (Week 2): Progressing toward goal OT Short Term Goal 3 (Week 2): Pt will be able to don pants with min A. OT Short Term Goal 3 - Progress (Week 2): Met Week 3:  OT Short Term Goal 1 (Week 3): STGs =LTGs due to ELOS  Skilled Therapeutic Interventions/Progress Updates:    Pt reports feeling weak due to sitting up in w/c for 3 hours today for first time.  Pt on toilet just finished having continent bowel movement. Completed toileting with mod assist.  Mild redness noted on buttocks therefore barrier cream applied to area by OT. Re-educated pt on importance of pressure reduction strategies when sitting up in w/c and at bed level.  Pt transferred to EOB and educated on AE use for LB dressing including sock aide, reacher, shoe cuff and pt able to return demonstrate with supervision, however pt reports he would prefer to complete using compensatory strategies only.  Pt donned/doffed socks and shoes using figure 4 position at EOB with CGA without AE, except for donning of left sock completed in supine with supervision due to difficulty completing in seated position.  Pt returned to supine at end of session, call bell in reach.    Therapy Documentation Precautions:  Precautions Precautions: Fall,Back Precaution Comments: General post-op back precautions Restrictions Weight Bearing Restrictions: No   Therapy/Group: Individual  Therapy  Ezekiel Slocumb 03/15/2020, 4:40 PM

## 2020-03-15 NOTE — Patient Care Conference (Signed)
Inpatient RehabilitationTeam Conference and Plan of Care Update Date: 03/15/2020   Time: 11:19 AM    Patient Name: Timothy Ware      Medical Record Number: [id number]  Date of Birth: 1934/05/24 Sex: Male         Room/Bed: 4M07C/4M07C-01 Payor Info: Payor: MEDICARE / Plan: MEDICARE PART A AND B / Product Type: *No Product type* /    Admit Date/Time:  02/28/2020  4:15 PM  Primary Diagnosis:  HNP (herniated nucleus pulposus with myelopathy), thoracic  Hospital Problems: Principal Problem:   HNP (herniated nucleus pulposus with myelopathy), thoracic Active Problems:   Thoracic myelopathy   Hypoalbuminemia due to protein-calorie malnutrition (HCC)   Essential hypertension   Postoperative pain   Steroid-induced hyperglycemia   Labile blood pressure   Elevated serum creatinine   Bradycardia   Elevated BUN   Neurogenic bladder   Elevated creatine kinase   Orthostatic hypotension   Leucocytosis   Acute blood loss anemia   Thrombocytopenia [facility])    Expected Discharge Date: Expected Discharge Date: 03/22/20  Team Members Present: Physician leading conference: Dr. Sula Soda Care Coodinator Present: Chana Bode, RN, BSN, CRRN;Becky Dupree, LCSW Nurse Present: Jesusita Oka, LPN PT Present: Other (comment) Maylene Roes, PT) OT Present: Dolphus Jenny, OT PPS Coordinator present : Fae Pippin, [place], PT     Current Status/Progress Goal Weekly Team Focus  Bowel/Bladder             Swallow/Nutrition/ Hydration             ADL's   setup UB ADLs; min-modA LB ADLs sitting/bed level; CGA squat pivots  supervision; will downgrade goals due to slow progress  self care training, compensatory training, functional transfers   Mobility   min assist bed mobility, min assist squat pivot transfers, gait ~16ft using LiteGait partial BWS otherwise max assist at L hallway rail with +2 w/c follow; w/c mobility mod-I  supervision/CGA overall - will need to downgrade ambulation  and stair goals, w/c goals added  bed mobility, functional transfers, gait training, pt/family education, wheelchair propulsion and management, activity tolerance, R LE NMR   Communication             Safety/Cognition/ Behavioral Observations            Pain             Skin               Discharge Planning:  Home wife can not assist-except provide supervision due to back issues. Two daughter's involved   Team Discussion: Slow gains noted. Discussion if wife can provide care recommended at discharge with back issues. Discussed need for/lack of need for a hospital bed at discharge.  Patient on target to meet rehab goals: yes, currently CGA with verbal cues for transfers. Anticipate will be at a wheelchair level at discharge as currently max assist +2 for gait. Supervision - CGA goals set.  *See Care Plan and progress notes for long and short-term goals.   Revisions to Treatment Plan:  Three muskateer gait trials  Teaching Needs: Transfers, toileting, medications, etc.  Current Barriers to Discharge: Decreased caregiver support  Possible Resolutions to Barriers: Family education     Medical Summary Current Status: No new complaints, BP well controlled, incision healing well, Hgb stable, pain is well controlled, leukocytosis resolved  Barriers to Discharge: Medical stability;Behavior;Decreased family/caregiver support  Barriers to Discharge Comments: Feels his wife can do more than she will be able to due  to her back problems, leukocytosis this week, constipation Possible Resolutions to [employer] Focus: Continue to monitor labs weekly, vitals TID, caregiver training, continue bowel regimen   Continued Need for Acute Rehabilitation Level of Care: The patient requires daily medical management by a physician with specialized training in physical medicine and rehabilitation for the following reasons: Direction of a multidisciplinary physical rehabilitation program to maximize  functional independence : Yes Medical management of patient stability for increased activity during participation in an intensive rehabilitation regime.: Yes Analysis of laboratory values and/or radiology reports with any subsequent need for medication adjustment and/or medical intervention. : Yes   I attest that I was present, lead the team conference, and concur with the assessment and plan of the team.   Chana Bode B 03/15/2020, 2:11 PM

## 2020-03-15 NOTE — Progress Notes (Signed)
Physical Therapy Session Note  Patient Details  Name: Timothy Ware MRN: [id number] Date of Birth: November 17, 1934  Today's Date: 03/15/2020 PT Individual Time: 0905-1000 and 1100-1200 PT Individual Time Calculation (min): 55 min and 60 min  Short Term Goals: Week 3:  PT Short Term Goal 1 (Week 3): Pt will be Independent w/squat pivot transfers to/from bed, will verbalize instructions for  set up assist if needed. PT Short Term Goal 2 (Week 3): mod I wc propulsion on mildly uneven surfaces PT Short Term Goal 3 (Week 3): Pt will ambulate 53f w/LRAD and mod assist, wc follow  Skilled Therapeutic Interventions/Progress Updates: Pt presented in bed agreeable to therapy. Pt states feels very fatigued this am, took more effort to transfer via stedy per pt. Session focused on use of pressure map with different seat cushions. Pt performed supine to sit with supervision and verbal cues to maintain spinal precautions. Pt then performed squat pivot to w/c with supervision and propelled to rehab gym mod I. Pt was mod I for w/c parts management and performed squat pivot to mat with pressure pad placed. Pt then performed Stedy transfer back to w/c with JUlice Dashcushion on for pressure mapping, transferred back to high/low mat and transferred back to w/c with Roho cushion placed.  Once mapping completed pt propelled back to room and performed squat pivot to return to bed in same manner as prior. Pt returned to supine and left with bed alarm on, call bell within reach and needs met.   Pressure mapping: high/loiw mat: more pressure on L vs R, 3.6% dispersion inddex  Jay cushion: Dispersion index: continues to demonstrate L bias, DIspersion index ~1.5% Peak pressure 47  Roho: Dispersion index ~16% Peak pressure ~18 blues/white    Tx2: Pt presented in bed agreeable to therapy. Pt indicated may have need for BM. Performed supine to sit EOB with supervision and use of bed feautres. Performed squat pivot to w/c supervision  and transported to toilet. Pt required increased time to set up w/c next to elevated toilet and performed transfer with CGA with PTA providing total A for clothing management (+BM/urinary void). PTA then used Stedy once completed to have pt come to standing to complete peri-care to ensure thoroughness. Pt then transferred via Stedy to w/c. Pt propelled to sink to perform hand hygiene mod I then propelled to day room supervision. Performed squat pivot to NuStep and participated in NuStep L5 x 5 min all four extremities. Pt then did an additional x 5 min L2 BLE only for RLE strengthening. Pt required intermittent assist and/or cues to maintain RLE in neutral alignment. Once completed performed squat pivot transfer to return to w/c and propelled back to room with supervision and increased time due to fatigue. Pt agreeable to remain in w/c at end of session with seat alarm on, call bell within reach and needs met with dgt present.      Therapy Documentation Precautions:  Precautions Precautions: Fall,Back Precaution Comments: General post-op back precautions Restrictions Weight Bearing Restrictions: No General:   Vital Signs:      Therapy/Group: Individual Therapy  Catalaya Garr  Daleyza Gadomski, PTA  03/15/2020, 12:36 PM

## 2020-03-15 NOTE — Progress Notes (Signed)
Patient ID: Timothy Ware, male   DOB: 01-Sep-1934, 85 y.o.   MRN: [id number]  Met with pt and daughter who was present in his room to discuss team conference progress toward his goals and discharge still 12/29. Discussed having wife and daughter come in for education. Daughter feels their brother-Scott should come in due to he is at the home more. Have scheduled for Friday at 11:00 for wife and son to attend therapies with pt. Pt wants a hospital bed for home will discuss with therapy team.

## 2020-03-15 NOTE — Progress Notes (Signed)
[place] PHYSICAL MEDICINE & REHABILITATION PROGRESS NOTE  Subjective/Complaints: No complaints this morning BP well controlled Incision stable Hgb stable  ROS: denies CP, SOB, N/V/D  Objective: Vital Signs: Blood pressure 106/62, pulse 71, temperature 97.8 F (36.6 C), resp. rate 16, height 6\' 1"  (1.854 m), weight 84.5 kg, SpO2 98 %. No results found. Recent Labs    03/14/20 0425 03/15/20 0420  WBC 10.5 9.8  HGB 12.5* 12.8*  HCT 37.4* 36.8*  PLT 125* 121*   Recent Labs    03/13/20 1103  NA 135  K 4.0  CL 103  CO2 18*  GLUCOSE 161*  BUN 31*  CREATININE 1.47*  CALCIUM 8.8*    Intake/Output Summary (Last 24 hours) at 03/15/2020 1500 Last data filed at 03/15/2020 1300 Gross per 24 hour  Intake 720 ml  Output 550 ml  Net 170 ml     Physical Exam: BP 106/62 (BP Location: Right Arm)   Pulse 71   Temp 97.8 F (36.6 C)   Resp 16   Ht 6\' 1"  (1.854 m)   Wt 84.5 kg   SpO2 98%   BMI 24.57 kg/m  Gen: no distress, normal appearing HEENT: oral mucosa pink and moist, NCAT Cardio: Reg rate Chest: normal effort, normal rate of breathing Abd: soft, non-distended Ext: no edema Skin: Warm and dry.  Incision CDI Psych: Normal mood.  Normal behavior. Musc: No edema in extremities.  No tenderness in extremities. Neurologic: Alert Bilateral upper extremities: 5/5 proximal distal Left lower extremity: Hip flexion, knee extension 4-4+/5, ankle dorsiflexion 5/5, unchanged Right lower extremity: Hip flexion, knee extension 4/5, ankle dorsiflexion 5/5, unchanged Sensation diminished to light touch right lower extremity >> left lower extremity, improving  Assessment/Plan: 1. Functional deficits which require 3+ hours per day of interdisciplinary therapy in a comprehensive inpatient rehab setting.  Physiatrist is providing close team supervision and 24 hour management of active medical problems listed below.  Physiatrist and rehab team continue to assess barriers to  discharge/monitor patient progress toward functional and medical goals   Care Tool:  Bathing    Body parts bathed by patient: Right arm,Left arm,Chest,Abdomen,Front perineal area,Buttocks,Right upper leg,Left upper leg,Face   Body parts bathed by helper: Left lower leg,Right lower leg Body parts n/a: Right lower leg,Left lower leg   Bathing assist Assist Level: Minimal Assistance - Patient > 75%     Upper Body Dressing/Undressing Upper body dressing   What is the patient wearing?: Pull over shirt    Upper body assist Assist Level: Set up assist    Lower Body Dressing/Undressing Lower body dressing      What is the patient wearing?: Pants     Lower body assist Assist for lower body dressing: Minimal Assistance - Patient > 75%     Toileting Toileting    Toileting assist Assist for toileting: Moderate Assistance - Patient 50 - 74% Assistive Device Comment:  (urinal)   Transfers Chair/bed transfer  Transfers assist     Chair/bed transfer assist level: Minimal Assistance - Patient > 75% (squat pivot)     Locomotion Ambulation   Ambulation assist      Assist level: 2 helpers (max A of 1 and +2 w/c follow @ L hallway rail) Assistive device: Other (comment) (L hallway rail) Max distance: 3ft   Walk 10 feet activity   Assist  Walk 10 feet activity did not occur: Safety/medical concerns  Assist level: 2 helpers (max A of 1 and +2 w/c follow @ L hallway  rail) Assistive device: Other (comment) (L hallway rail)   Walk 50 feet activity   Assist Walk 50 feet with 2 turns activity did not occur: Safety/medical concerns         Walk 150 feet activity   Assist Walk 150 feet activity did not occur: Safety/medical concerns         Walk 10 feet on uneven surface  activity   Assist Walk 10 feet on uneven surfaces activity did not occur: Safety/medical concerns         Wheelchair     Assist Will patient use wheelchair at discharge?:  Yes Type of Wheelchair: Manual    Wheelchair assist level: Independent Max wheelchair distance: 226ft    Wheelchair 50 feet with 2 turns activity    Assist        Assist Level: Independent   Wheelchair 150 feet activity     Assist      Assist Level: Independent    Medical Problem List and Plan: 1.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 Status post transpedicular microdiscectomy T10-T11 02/25/2020. No brace required  Continue CIR  Decadron decreased to 4 twice daily on 12/8, decreased to daily on 12/13, decreased further on 12/21  Repeat thoracolumbar x-ray on 12/16 stable without malalignment 2. Antithrombotics: -DVT/anticoagulation:SCDs.  Vascular studies reviewed negative for DVT -antiplatelet therapy: Aspirin 81 mg daily   Hemoglobin within normal limits on 12/20 3.  Postoperative pain:  12/22: pain well controlled- d/c oxycodone  Monitor with increased exertion 4. Mood:Provide emotional support -antipsychotic agents: N/A 5. Neuropsych: This patientiscapable of making decisions on hisown behalf. 6. Skin/Wound Care:Routine skin checks 7. Fluids/Electrolytes/Nutrition:Routine in and outs. 8. Hypertension.   Continue tenormin 25mg  daily and lotensin 10mg  daily  12/22 BP well controlled- continue current regimen.  9. CAD with PTCA. Aspirin 81 mg daily. 10. Hyperlipidemia. Lipitor 11.  Hypoalbuminemia  Supplement initiated  12.  Steroid-induced hyperglycemia  Elevated on 12/20 13. Bradycardia: Does f/u with cardiology, has an appointment in January.   Controlled on 12/21 14.  Elevated BUN/creatinine, likely CKD  Creatinine 1.47 on 12/20, repeat tomorrow since trending upward. Placed nursing order to encourage hydration.   Encourage fluids 15. Urinary retention  UA negative  PVRs unremarkable after initial elevated volume  Flomax ordered, changed to nightly due to orthostasis  Improving,  continue to monitor  Improved 16. Orthostasis-new problem  Likely medication induced, see #15  Improving  Continue to monitor 17. Constipation  Continue bowel meds 18.  Leukocytosis-likely steroid-induced  WBC 10.5 on 12/21, labs ordered for tomorrow  Afebrile 19.  Acute blood loss anemia  Hemoglobin 12.5 on 12/21, up to 12.8 on 12/22 20.  Thrombocytopenia  Platelets 125 on 12/21, down to 121 on 12/22, repeat tomorrow  LOS: 16 days A FACE TO FACE EVALUATION WAS PERFORMED  Amy Belloso P Nayah Lukens 03/15/2020, 3:00 PM

## 2020-03-16 ENCOUNTER — Inpatient Hospital Stay (HOSPITAL_COMMUNITY): Payer: Medicare Other | Admitting: Physical Therapy

## 2020-03-16 ENCOUNTER — Inpatient Hospital Stay (HOSPITAL_COMMUNITY): Payer: Medicare Other

## 2020-03-16 ENCOUNTER — Inpatient Hospital Stay (HOSPITAL_COMMUNITY): Payer: Medicare Other | Admitting: Occupational Therapy

## 2020-03-16 LAB — BASIC METABOLIC PANEL
Anion gap: 8 (ref 5–15)
BUN: 34 mg/dL — ABNORMAL HIGH (ref 8–23)
CO2: 23 mmol/L (ref 22–32)
Calcium: 8.2 mg/dL — ABNORMAL LOW (ref 8.9–10.3)
Chloride: 105 mmol/L (ref 98–111)
Creatinine, Ser: 1.26 mg/dL — ABNORMAL HIGH (ref 0.61–1.24)
GFR, Estimated: 57 mL/min — ABNORMAL LOW (ref >60)
Glucose, Bld: 101 mg/dL — ABNORMAL HIGH (ref 70–99)
Potassium: 4 mmol/L (ref 3.5–5.1)
Sodium: 136 mmol/L (ref 135–145)

## 2020-03-16 LAB — CBC WITH DIFFERENTIAL/PLATELET
Abs Immature Granulocytes: 0.03 10*3/uL (ref 0.00–0.07)
Basophils Absolute: 0 10*3/uL (ref 0.0–0.1)
Basophils Relative: 0 %
Eosinophils Absolute: 0.2 10*3/uL (ref 0.0–0.5)
Eosinophils Relative: 2 %
HCT: 36.7 % — ABNORMAL LOW (ref 39.0–52.0)
Hemoglobin: 12.1 g/dL — ABNORMAL LOW (ref 13.0–17.0)
Immature Granulocytes: 0 %
Lymphocytes Relative: 19 %
Lymphs Abs: 1.6 10*3/uL (ref 0.7–4.0)
MCH: 31.3 pg (ref 26.0–34.0)
MCHC: 33 g/dL (ref 30.0–36.0)
MCV: 94.8 fL (ref 80.0–100.0)
Monocytes Absolute: 0.5 10*3/uL (ref 0.1–1.0)
Monocytes Relative: 6 %
Neutro Abs: 6.5 10*3/uL (ref 1.7–7.7)
Neutrophils Relative %: 73 %
Platelets: 120 10*3/uL — ABNORMAL LOW (ref 150–400)
RBC: 3.87 MIL/uL — ABNORMAL LOW (ref 4.22–5.81)
RDW: 13.2 % (ref 11.5–15.5)
WBC: 8.8 10*3/uL (ref 4.0–10.5)
nRBC: 0 % (ref 0.0–0.2)

## 2020-03-16 NOTE — Progress Notes (Signed)
Physical Therapy Session Note  Patient Details  Name: Timothy Ware MRN: [id number] Date of Birth: [birth year]  Today's Date: 03/16/2020 PT Individual Time: 1300-1400 PT Individual Time Calculation (min): 60 min   Short Term Goals: Week 3:  PT Short Term Goal 1 (Week 3): Pt will be Independent w/squat pivot transfers to/from bed, will verbalize instructions for  set up assist if needed. PT Short Term Goal 2 (Week 3): mod I wc propulsion on mildly uneven surfaces PT Short Term Goal 3 (Week 3): Pt will ambulate 36ft w/LRAD and mod assist, wc follow  Skilled Therapeutic Interventions/Progress Updates:    Patient received supine in bed, agreeable to PT. He denies pain. Patient able to direct correct set up on wc for transfer and transfer to wc via lateral scoot with supervision. Patient propelling himself in wc >266ft ModI. He was able to transfer to NuStep via lateral scoot with supervision and verbal cues for hand placement. Patient completing 2x5 mins B LE only on NuStep level 5 for improved B LE muscle activation and control. Noted improved ability to maintain R knee in proper alignment through dynamic movement. Patient does note significant difficulty with this and increased concentration required. Patient able to transfer to wc and then therapy mat via lateral scoot with supervision. He does not follow standard back precautions despite PT attempts at intervention and education. He was able to perform 3x10 bridges with intermittent physical assist to maintain R knee alignment in hooklying. Patient able to propel himself in wc back to room ModI and transfer to bed via lateral scoot with supervision. He will benefit from continued B LE NMR to improve safety and efficiency of transfers and progress toward functional gait at later time. Patient remaining in bed, bed alarm on, call light within reach.   Therapy Documentation Precautions:  Precautions Precautions: Fall,Back Precaution Comments: General  post-op back precautions Restrictions Weight Bearing Restrictions: No    Therapy/Group: Individual Therapy  Elizebeth Koller, PT, DPT, CBIS  03/16/2020, 7:40 AM

## 2020-03-16 NOTE — Progress Notes (Signed)
Occupational Therapy Session Note  Patient Details  Name: Timothy Ware MRN: [id number] Date of Birth: [birth year]  Today's Date: 03/16/2020 OT Individual Time: 0935-1030 OT Individual Time Calculation (min): 55 min    Short Term Goals: Week 1:  OT Short Term Goal 1 (Week 1): Pt will complete toileting with min assist OT Short Term Goal 1 - Progress (Week 1): Progressing toward goal OT Short Term Goal 2 (Week 1): Pt will complete toilet transfer with CGA OT Short Term Goal 2 - Progress (Week 1): Progressing toward goal OT Short Term Goal 3 (Week 1): Pt will complete LB dressing with min assist OT Short Term Goal 3 - Progress (Week 1): Progressing toward goal OT Short Term Goal 4 (Week 1): Pt will complete LB bathing with min assist OT Short Term Goal 4 - Progress (Week 1): Progressing toward goal Week 2:  OT Short Term Goal 1 (Week 2): Pt will complete toilet transfer with CGA. OT Short Term Goal 1 - Progress (Week 2): Met OT Short Term Goal 2 (Week 2): Pt will be able to manage clothing over his hips at toilet with min A. OT Short Term Goal 2 - Progress (Week 2): Progressing toward goal OT Short Term Goal 3 (Week 2): Pt will be able to don pants with min A. OT Short Term Goal 3 - Progress (Week 2): Met Week 3:  OT Short Term Goal 1 (Week 3): STGs =LTGs due to ELOS     Skilled Therapeutic Interventions/Progress Updates:    Pt received in bed and agreeable to therapy. He declined changing or bathing LB stating he had just been changed recently, he did want to shave and do UB self care.  Sat to EOB with S and completed squat pivot to his R to his w/c with CGA.   In chair, pt sat at sink, washed UB, shaved, completed oral care and donned new shirts all with set up only.  He then worked on [employer] with level 1 theraband for rows for biceps and upper back and tricep extensions.  For gentle L shoulder AROM, pt used cane handle with cane on floor for gentle circular AROM.    For  transfer back to bed to his L pt wanted to demonstrate how he can transfer by himself. Pt was able to do a squat pivot with supervision.  He then continued to work on squat pivots for length of bed to move towards pillow working on use of core control to assist him with smooth transfers.  Moved to supine with S.  Set up in bed with all needs met.  Bed alarm set.    Therapy Documentation Precautions:  Precautions Precautions: Fall,Back Precaution Comments: General post-op back precautions Restrictions Weight Bearing Restrictions: No     Pain: Pain Assessment Pain Score: 0-No pain ADL: ADL Grooming: Setup (sitting at w/c) Where Assessed-Grooming: Sitting at sink Upper Body Bathing: Setup Where Assessed-Upper Body Bathing: Sitting at sink Lower Body Bathing: Moderate assistance Where Assessed-Lower Body Bathing: Sitting at sink,Standing at sink Upper Body Dressing: Setup Where Assessed-Upper Body Dressing: Sitting at sink Lower Body Dressing: Moderate assistance Where Assessed-Lower Body Dressing: Sitting at sink,Standing at sink Toileting: Maximal assistance Where Assessed-Toileting: Other (Comment) (3 in 1 commode over toilet) Toilet Transfer: Moderate assistance Toilet Transfer Method: Squat pivot Toilet Transfer Equipment: Drop arm bedside commode   Therapy/Group: Individual Therapy  [place] 03/16/2020, 12:28 PM

## 2020-03-16 NOTE — Progress Notes (Signed)
Physical Therapy Session Note  Patient Details  Name: Timothy Ware MRN: [id number] Date of Birth: April 29, 1934  Today's Date: 03/16/2020 PT Individual Time: 1530-1630 PT Individual Time Calculation (min): 60 min   Short Term Goals: Week 3:  PT Short Term Goal 1 (Week 3): Pt will be Independent w/squat pivot transfers to/from bed, will verbalize instructions for  set up assist if needed. PT Short Term Goal 2 (Week 3): mod I wc propulsion on mildly uneven surfaces PT Short Term Goal 3 (Week 3): Pt will ambulate 68f w/LRAD and mod assist, wc follow  Skilled Therapeutic Interventions/Progress Updates:   Pt received supine in bed and agreeable to PT. Supine>sit transfer with supervision assist and min cues for use of bed features. Sitting balance with supervision assist EOB while PT donned shoes with max assist for time management. [facility] .   WC mobility without assist from PT through rehab unit. X [id number] and [id number] Squat pivot transfer to and from nustep with min assist to the L and CGA to the R back to WC. PT required to stabilize Bil feet and R thigh to prevent excessive ER on the RLE. levl 5 x 10 min with short therapeutic rest break upon completion.   Sit<>stand in parallel bars with supervision assist. Gait training in parallel bars with min assist to control the RLE and prevent excessive step length and provide tactile cues to improve knee extension in stand and prevent buckling. Noted to self select step to pattern to maintain visualization of the RLE. Pt then reporting fatigue following gait training and all additional therapy throughout the day.  Pt returned to room and performed squat pivot transfer to bed with supervision assist and cues for LE placement. Sit>supine completed with CGA for LE control, and left supine in bed with call bell in reach and all needs met.          Therapy Documentation Precautions:   Precautions Precautions: Fall,Back Precaution Comments: General post-op back precautions Restrictions Weight Bearing Restrictions: No  PT Amount of Missed Time (min): 15 Minutes PT Missed Treatment Reason: Patient fatigue Vital Signs: Therapy Vitals Temp: 98 F (36.7 C) Pulse Rate: 83 Resp: 16 BP: (!) 108/53 Patient Position (if appropriate): Lying Oxygen Therapy SpO2: 98 % O2 Device: Room Air Pain:   denies    Therapy/Group: Individual Therapy  AusLorie Phenix/23/2021, 4:46 PM

## 2020-03-16 NOTE — Progress Notes (Addendum)
Patient ID: Timothy Ware, male   DOB: [birth year], 85 y.o.   MRN: [id number]  Ordered hospital bed and drop-arm bedside via [employer] to set up delivery with wife for home delivery. Family education tomorrow with wife and son.

## 2020-03-16 NOTE — Progress Notes (Signed)
[place] PHYSICAL MEDICINE & REHABILITATION PROGRESS NOTE  Subjective/Complaints: No complaints this morning. Vitals are stable.  Hgb, PC, and Cr decreased today.   ROS: denies CP, SOB, N/V/D  Objective: Vital Signs: Blood pressure 131/62, pulse 82, temperature 97.9 F (36.6 C), temperature source Oral, resp. rate 18, height 6\' 1"  (1.854 m), weight 84.5 kg, SpO2 100 %. No results found. Recent Labs    03/15/20 0420 03/16/20 0413  WBC 9.8 8.8  HGB 12.8* 12.1*  HCT 36.8* 36.7*  PLT 121* 120*   Recent Labs    03/13/20 1103 03/16/20 0413  NA 135 136  K 4.0 4.0  CL 103 105  CO2 18* 23  GLUCOSE 161* 101*  BUN 31* 34*  CREATININE 1.47* 1.26*  CALCIUM 8.8* 8.2*    Intake/Output Summary (Last 24 hours) at 03/16/2020 1024 Last data filed at 03/16/2020 0900 Gross per 24 hour  Intake 720 ml  Output 650 ml  Net 70 ml     Physical Exam: BP 131/62 (BP Location: Right Arm)   Pulse 82   Temp 97.9 F (36.6 C) (Oral)   Resp 18   Ht 6\' 1"  (1.854 m)   Wt 84.5 kg   SpO2 100%   BMI 24.57 kg/m  Gen: no distress, normal appearing HEENT: oral mucosa pink and moist, NCAT Cardio: Reg rate Chest: normal effort, normal rate of breathing Abd: soft, non-distended Ext: no edema Skin: Warm and dry.  Incision CDI Psych: Normal mood.  Normal behavior. Musc: No edema in extremities.  No tenderness in extremities. Neurologic: Alert Bilateral upper extremities: 5/5 proximal distal Left lower extremity: Hip flexion, knee extension 4-4+/5, ankle dorsiflexion 5/5, unchanged Right lower extremity: Hip flexion, knee extension 4/5, ankle dorsiflexion 5/5, unchanged Sensation diminished to light touch right lower extremity >> left lower extremity, improving  Assessment/Plan: 1. Functional deficits which require 3+ hours per day of interdisciplinary therapy in a comprehensive inpatient rehab setting.  Physiatrist is providing close team supervision and 24 hour management of active  medical problems listed below.  Physiatrist and rehab team continue to assess barriers to discharge/monitor patient progress toward functional and medical goals   Care Tool:  Bathing    Body parts bathed by patient: Right arm,Left arm,Chest,Abdomen,Front perineal area,Buttocks,Right upper leg,Left upper leg,Face   Body parts bathed by helper: Left lower leg,Right lower leg Body parts n/a: Right lower leg,Left lower leg   Bathing assist Assist Level: Minimal Assistance - Patient > 75%     Upper Body Dressing/Undressing Upper body dressing   What is the patient wearing?: Pull over shirt    Upper body assist Assist Level: Set up assist    Lower Body Dressing/Undressing Lower body dressing      What is the patient wearing?: Pants     Lower body assist Assist for lower body dressing: Minimal Assistance - Patient > 75%     Toileting Toileting    Toileting assist Assist for toileting: Moderate Assistance - Patient 50 - 74% Assistive Device Comment:  (urinal)   Transfers Chair/bed transfer  Transfers assist     Chair/bed transfer assist level: Minimal Assistance - Patient > 75% (squat pivot)     Locomotion Ambulation   Ambulation assist      Assist level: 2 helpers (max A of 1 and +2 w/c follow @ L hallway rail) Assistive device: Other (comment) (L hallway rail) Max distance: 71ft   Walk 10 feet activity   Assist  Walk 10 feet activity did not occur:  Safety/medical concerns  Assist level: 2 helpers (max A of 1 and +2 w/c follow @ L hallway rail) Assistive device: Other (comment) (L hallway rail)   Walk 50 feet activity   Assist Walk 50 feet with 2 turns activity did not occur: Safety/medical concerns         Walk 150 feet activity   Assist Walk 150 feet activity did not occur: Safety/medical concerns         Walk 10 feet on uneven surface  activity   Assist Walk 10 feet on uneven surfaces activity did not occur: Safety/medical  concerns         Wheelchair     Assist Will patient use wheelchair at discharge?: Yes Type of Wheelchair: Manual    Wheelchair assist level: Independent Max wheelchair distance: 281ft    Wheelchair 50 feet with 2 turns activity    Assist        Assist Level: Independent   Wheelchair 150 feet activity     Assist      Assist Level: Independent    Medical Problem List and Plan: 1.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 Status post transpedicular microdiscectomy T10-T11 02/25/2020. No brace required  Continue CIR  Decadron decreased to 4 twice daily on 12/8, decreased to daily on 12/13, decreased further on 12/21  Repeat thoracolumbar x-ray on 12/16 stable without malalignment 2. Antithrombotics: -DVT/anticoagulation:SCDs.  Vascular studies reviewed negative for DVT -antiplatelet therapy: Aspirin 81 mg daily   Hemoglobin within normal limits on 12/20 3.  Postoperative pain:  12/23: pain well controlled off medications- d/ced oxycodone  Monitor with increased exertion 4. Mood:Provide emotional support -antipsychotic agents: N/A 5. Neuropsych: This patientiscapable of making decisions on hisown behalf. 6. Skin/Wound Care:Routine skin checks 7. Fluids/Electrolytes/Nutrition:Routine in and outs. 8. Hypertension.   Continue tenormin 25mg  daily and lotensin 10mg  daily  12/23: BP is well controlled, continue current regimen.   9. CAD with PTCA. Aspirin 81 mg daily. 10. Hyperlipidemia. Lipitor 11.  Hypoalbuminemia  Supplement initiated  12.  Steroid-induced hyperglycemia  Elevated on 12/20 13. Bradycardia: Does f/u with cardiology, has an appointment in January.   Controlled on 12/23- continue to monitor TID 14.  Elevated BUN/creatinine, likely CKD  Creatinine 1.47 on 12/20, improved 12/23. Placed nursing order to encourage hydration.   Encourage fluids 15. Urinary retention  UA  negative  PVRs unremarkable after initial elevated volume  Flomax ordered, changed to nightly due to orthostasis  Improving, continue to monitor  Improved 16. Orthostasis-new problem  Likely medication induced, see #15  Improving  Continue to monitor 17. Constipation  Continue bowel meds 18.  Leukocytosis-likely steroid-induced  WBC 10.5 on 12/21, labs ordered for tomorrow  Afebrile 19.  Acute blood loss anemia  Hemoglobin 12.5 on 12/21, up to 12.8 on 12/22 20.  Thrombocytopenia  Platelets 125 on 12/21, down to 121 on 12/22, down to 120 on 12/23, repeat Monday  LOS: 17 days A FACE TO FACE EVALUATION WAS PERFORMED  1/24 Cyril Woodmansee 03/16/2020, 10:24 AM

## 2020-03-16 NOTE — Plan of Care (Signed)
  Problem: RH Ambulation Goal: LTG Patient will ambulate in controlled environment (PT) Description: LTG: Patient will ambulate in a controlled environment, # of feet with assistance (PT). Outcome: Not Applicable Goal: LTG Patient will ambulate in home environment (PT) Description: LTG: Patient will ambulate in home environment, # of feet with assistance (PT). Outcome: Not Applicable   Problem: RH Balance Goal: LTG Patient will maintain dynamic standing balance (PT) Description: LTG:  Patient will maintain dynamic standing balance with assistance during mobility activities (PT) Flowsheets (Taken 03/16/2020 1431) LTG: Pt will maintain dynamic standing balance during mobility activities with:: (downgraded due to patient progress) Moderate Assistance - Patient 50 - 74% Note: downgraded due to patient progress   Problem: Sit to Stand Goal: LTG:  Patient will perform sit to stand with assistance level (PT) Description: LTG:  Patient will perform sit to stand with assistance level (PT) Flowsheets (Taken 03/16/2020 1431) LTG: PT will perform sit to stand in preparation for functional mobility with assistance level: (downgraded due to patient progress) Moderate Assistance - Patient 50 - 74% Note: downgraded due to patient progress

## 2020-03-17 ENCOUNTER — Encounter (HOSPITAL_COMMUNITY): Payer: Medicare Other | Admitting: Occupational Therapy

## 2020-03-17 ENCOUNTER — Inpatient Hospital Stay (HOSPITAL_COMMUNITY): Payer: Medicare Other | Admitting: Occupational Therapy

## 2020-03-17 ENCOUNTER — Ambulatory Visit (HOSPITAL_COMMUNITY): Payer: Medicare Other | Admitting: Physical Therapy

## 2020-03-17 MED ORDER — METHOCARBAMOL 500 MG PO TABS
500.0000 mg | ORAL_TABLET | Freq: Four times a day (QID) | ORAL | Status: DC | PRN
  Administered 2020-03-17: 500 mg via ORAL
  Filled 2020-03-17: qty 1

## 2020-03-17 MED ORDER — TRAMADOL HCL 50 MG PO TABS
50.0000 mg | ORAL_TABLET | Freq: Four times a day (QID) | ORAL | Status: DC
Start: 2020-03-18 — End: 2020-03-17

## 2020-03-17 MED ORDER — TRAMADOL HCL 50 MG PO TABS: 50.0000 mg | ORAL_TABLET | Freq: Four times a day (QID) | ORAL | Status: DC | PRN

## 2020-03-17 MED ORDER — TRAMADOL HCL 50 MG PO TABS
50.0000 mg | ORAL_TABLET | Freq: Four times a day (QID) | ORAL | Status: DC | PRN
  Administered 2020-03-17 – 2020-03-19 (×2): 50 mg via ORAL
  Filled 2020-03-17 (×2): qty 1

## 2020-03-17 MED ORDER — BENAZEPRIL HCL 5 MG PO TABS
5.0000 mg | ORAL_TABLET | Freq: Every day | ORAL | Status: DC
  Administered 2020-03-17 – 2020-03-22 (×6): 5 mg via ORAL
  Filled 2020-03-17 (×6): qty 1

## 2020-03-17 NOTE — Plan of Care (Signed)
  Problem: Consults Goal: RH GENERAL PATIENT EDUCATION Description: See Patient Education module for education specifics. Outcome: Progressing Goal: Skin Care Protocol Initiated - if Braden Score 18 or less Description: Skin will remain intact while on unit. Outcome: Progressing   Problem: RH BOWEL ELIMINATION Goal: RH STG MANAGE BOWEL W/MEDICATION W/ASSISTANCE Description: STG Manage Bowel with Medication with Assistance until discharge. Outcome: Progressing   Problem: RH SKIN INTEGRITY Goal: RH STG SKIN FREE OF INFECTION/BREAKDOWN Description: Patient's skin will remain intact until discharge. Outcome: Progressing   Problem: RH SAFETY Goal: RH STG ADHERE TO SAFETY PRECAUTIONS W/ASSISTANCE/DEVICE Description: STG Adhere to Safety Precautions With Assistance/Device until discharge. Outcome: Progressing Goal: RH STG DECREASED RISK OF FALL WITH ASSISTANCE Description: STG Decreased Risk of Fall With Assistance until discharge. Outcome: Progressing   Problem: RH PAIN MANAGEMENT Goal: RH STG PAIN MANAGED AT OR BELOW PT'S PAIN GOAL Description: Patient will have a pain goal of 2 out of 10 on pain scale. Outcome: Progressing   Problem: RH Pre-functional/Other (Specify) Goal: RH LTG Pre-functional (Specify) Outcome: Progressing Goal: RH LTG Interdisciplinary (Specify) 1 Description: RH LTG Interdisciplinary (Specify)1 Outcome: Progressing Goal: RH LTG Interdisciplinary (Specify) 2 Description: RH LTG Interdisciplinary (Specify) 2  Outcome: Progressing

## 2020-03-17 NOTE — Progress Notes (Signed)
Occupational Therapy Session Note  Patient Details  Name: Timothy Ware MRN: [id number] Date of Birth: June 27, 1934  Today's Date: 03/17/2020 OT Individual Time: 0800-0900   &  [id number] OT Individual Time Calculation (min): 60 min    &   60 min   Short Term Goals: Week 2:  OT Short Term Goal 1 (Week 2): Pt will complete toilet transfer with CGA. OT Short Term Goal 1 - Progress (Week 2): Met OT Short Term Goal 2 (Week 2): Pt will be able to manage clothing over his hips at toilet with min A. OT Short Term Goal 2 - Progress (Week 2): Progressing toward goal OT Short Term Goal 3 (Week 2): Pt will be able to don pants with min A. OT Short Term Goal 3 - Progress (Week 2): Met Week 3:  OT Short Term Goal 1 (Week 3): STGs =LTGs due to ELOS  Skilled Therapeutic Interventions/Progress Updates:    Session 1:   Patient in bed, alert, denies pain.  Supine to sitting edge of bed with CS.  Reviewed use of transfer board after conversation regarding pros/cons.  He requires cues and assistance to place board - transfer bed to w/c with SB CS/CGA.  Max A to donn shoes seated edge of bed.  He performed grooming tasks w/c level at sink mod I.  Sit pivot transfer to/from drop arm commode CGA, min cues for foot placement and safety.  He was able to manage clothing down/up with min A.  Sit pivot transfer w/c to bed with CGA.  He is able to get legs into bed with min A.  He remained in bed at close of session, bed alarm set and call bell in hand.     Session 2:   Family education - wife and son present for session this morning.  Reviewed and demonstrated LB dressing at bed level - patient able to donn shoes from supine position with set up.  Supine to sitting edge of bed with CS.  Reviewed guarding and safety for sit pivot transfers - discussed use of SB, gait belt.  Patient demonstrated transfer to/from bed, w/c and drop arm commode at a CGA level discussed DME and set up of environment, reviewed toileting and  assistance needs.  Wife did not guard patient this session - recommend ongoing practice and hands on training.  Reviewed strategies for w/c management post discharge.  Reviewed skin care and weight shifts.  Patient remained in bed at close of session.  Call bell in hand and bed alarm set.  Social work requested to review DME questions with patient and family.      Therapy Documentation Precautions:  Precautions Precautions: Fall,Back Precaution Comments: General post-op back precautions Restrictions Weight Bearing Restrictions: No   Therapy/Group: Individual Therapy  Carlos Levering 03/17/2020, 7:33 AM

## 2020-03-17 NOTE — Progress Notes (Addendum)
Patient ID: Timothy Ware, male   DOB: 1934/11/30, 85 y.o.   MRN: [id number] Scot-son and pt's wife here to attend pt's therapies and learn his care. Going well so far. PT session at 1:00.   12:46 PM Answered questions regarding equipment and coverages, will be contacting wife or son to set up delivery and discuss co-pays. Need delivery Tuesday in preparation for discharge Wed. Sent referral for equipment to [facility] since [employer] can not provide equipment.

## 2020-03-17 NOTE — Progress Notes (Signed)
Patient having significant pain not relieved by Tylenol. Dr. Wynn Banker gave T/O's to address the issue.

## 2020-03-17 NOTE — Progress Notes (Signed)
Patient ID: Timothy Ware, male   DOB: 1934-11-02, 85 y.o.   MRN: [id number]    Diagnosis code:M51.04 & M47.14  Height: 6'1  Weight: 192 lbs   Patient has thoracic myelopathy which requires his legs to be positioned in ways not feasible with a normal bed.  Head must be elevated at least 40 degrees .  He requires frequent and immediate changes in body position which cannot be achieved with a normal bed. Length of need 6-12 months

## 2020-03-17 NOTE — Progress Notes (Signed)
Physical Therapy Session Note  Patient Details  Name: Timothy Ware MRN: [id number] Date of Birth: 1934-05-14  Today's Date: 03/17/2020 PT Individual Time: 0626-9485 PT Individual Time Calculation (min): 80 min   Short Term Goals: Week 3:  PT Short Term Goal 1 (Week 3): Pt will be Independent w/squat pivot transfers to/from bed, will verbalize instructions for  set up assist if needed. PT Short Term Goal 2 (Week 3): mod I wc propulsion on mildly uneven surfaces PT Short Term Goal 3 (Week 3): Pt will ambulate 12f w/LRAD and mod assist, wc follow  Skilled Therapeutic Interventions/Progress Updates: Pt presented in bed with family present agreeable to therapy. Pt denies pain during session. Session focused on hands on family education. Pt's son concerned regarding logistics (how big is hospital bed, what size is w/c, when will DME be delievered, etc). Answered queries as able and advised that LSW will be intermediary between DME company and family (LSW had been in room earlier in day answering questions). Explained that w/c would likely not be significantly larger than current w/c particularly in width which pt's son was most concerned about (regarding width). Noted that current chair is 25in rim to rim and bathroom door is 25in in width. Advised that best/safest option is to use BSC outside of bathroom and perform sink baths. Pt and family verbalized understanding. Pt performed bed mobility with supervision and  PTA donned shoes total A for time mangement. Pt performed squat pivot to w/c from level bed as pt was unable to perform from lower bed height. PTA then inquiring what type of vehicle pt to d/c home with and family stating unsure due to w/c size. Advised that pt will most like need to perform squat pivot or SB transfer to car and SUV NOT RECOMMENDED. Pt then propelled to ortho gym and performed car transfer from 21in seat height. Pt was able to squat pivot into vehicle with supervision and  performed SB transfer back to w/c with CGA and assist for SB set up. Pt did require cues for appropriately placing w/c to allow SB clearance. Pt continued to perseverate on using SUV with PTA noting that many compact SUVs are 28-29 inches and would create a significant slope with family verbalizing understanding. Pt then propelled to rehab gym and PTA demonstrating bumping up curb (including turning anti-tippers), son was able to perform with good safety. Pt then demonstrated SB transfer to 169in mat with mod I for w/c parts management and supervision for transfer. Pt returned to room and performed squat pivot to level bed. PTA answered any additional queries from family. Family verbalizes feeling more comfortable with d/c Wednesday barring remaining logistical questions. Pt left in bed at end of session with bed alarm on, call bell within reach and current needs met.      Therapy Documentation Precautions:  Precautions Precautions: Fall,Back Precaution Comments: General post-op back precautions Restrictions Weight Bearing Restrictions: No General:   Vital Signs: Therapy Vitals Temp: 98.2 F (36.8 C) Pulse Rate: 87 Resp: 19 BP: 140/65 Patient Position (if appropriate): Lying Oxygen Therapy SpO2: 98 % O2 Device: Room Air Therapy/Group: Individual Therapy  Jawanza Zambito  Devere Brem, PTA  03/17/2020, 3:51 PM

## 2020-03-17 NOTE — Progress Notes (Signed)
[place] PHYSICAL MEDICINE & REHABILITATION PROGRESS NOTE  Subjective/Complaints: No complaints this morning He asks how long it will take for his nerves to recover function He notes that his wife and son will be coming in for caregiver training today.   ROS: denies CP, SOB, N/V/D  Objective: Vital Signs: Blood pressure 117/68, pulse 83, temperature 98.3 F (36.8 C), resp. rate 20, height 6\' 1"  (1.854 m), weight 84.5 kg, SpO2 99 %. No results found. Recent Labs    03/15/20 0420 03/16/20 0413  WBC 9.8 8.8  HGB 12.8* 12.1*  HCT 36.8* 36.7*  PLT 121* 120*   Recent Labs    03/16/20 0413  NA 136  K 4.0  CL 105  CO2 23  GLUCOSE 101*  BUN 34*  CREATININE 1.26*  CALCIUM 8.2*    Intake/Output Summary (Last 24 hours) at 03/17/2020 0751 Last data filed at 03/17/2020 0458 Gross per 24 hour  Intake 356 ml  Output 1500 ml  Net -1144 ml     Physical Exam: BP 117/68 (BP Location: Right Arm)   Pulse 83   Temp 98.3 F (36.8 C)   Resp 20   Ht 6\' 1"  (1.854 m)   Wt 84.5 kg   SpO2 99%   BMI 24.57 kg/m  Gen: no distress, normal appearing HEENT: oral mucosa pink and moist, NCAT Cardio: Reg rate Chest: normal effort, normal rate of breathing Abd: soft, non-distended Ext: no edema Skin: Warm and dry.  Incision CDI Psych: Normal mood.  Normal behavior. Musc: No edema in extremities.  No tenderness in extremities. Neurologic: Alert Bilateral upper extremities: 5/5 proximal distal Left lower extremity: Hip flexion, knee extension 4-4+/5, ankle dorsiflexion 5/5, unchanged Right lower extremity: Hip flexion, knee extension 4/5, ankle dorsiflexion 5/5, unchanged Sensation diminished to light touch right lower extremity >> left lower extremity, improving  Assessment/Plan: 1. Functional deficits which require 3+ hours per day of interdisciplinary therapy in a comprehensive inpatient rehab setting.  Physiatrist is providing close team supervision and 24 hour management of  active medical problems listed below.  Physiatrist and rehab team continue to assess barriers to discharge/monitor patient progress toward functional and medical goals   Care Tool:  Bathing    Body parts bathed by patient: Right arm,Left arm,Chest,Abdomen,Front perineal area,Buttocks,Right upper leg,Left upper leg,Face   Body parts bathed by helper: Left lower leg,Right lower leg Body parts n/a: Right lower leg,Left lower leg   Bathing assist Assist Level: Minimal Assistance - Patient > 75%     Upper Body Dressing/Undressing Upper body dressing   What is the patient wearing?: Pull over shirt    Upper body assist Assist Level: Set up assist    Lower Body Dressing/Undressing Lower body dressing      What is the patient wearing?: Pants     Lower body assist Assist for lower body dressing: Minimal Assistance - Patient > 75%     Toileting Toileting    Toileting assist Assist for toileting: Moderate Assistance - Patient 50 - 74% Assistive Device Comment:  (urinal)   Transfers Chair/bed transfer  Transfers assist     Chair/bed transfer assist level: Supervision/Verbal cueing (lateral scoot)     Locomotion Ambulation   Ambulation assist      Assist level: 2 helpers (max A of 1 and +2 w/c follow @ L hallway rail) Assistive device: Other (comment) (L hallway rail) Max distance: 19ft   Walk 10 feet activity   Assist  Walk 10 feet activity did not occur:  Safety/medical concerns  Assist level: 2 helpers (max A of 1 and +2 w/c follow @ L hallway rail) Assistive device: Other (comment) (L hallway rail)   Walk 50 feet activity   Assist Walk 50 feet with 2 turns activity did not occur: Safety/medical concerns         Walk 150 feet activity   Assist Walk 150 feet activity did not occur: Safety/medical concerns         Walk 10 feet on uneven surface  activity   Assist Walk 10 feet on uneven surfaces activity did not occur: Safety/medical  concerns         Wheelchair     Assist Will patient use wheelchair at discharge?: Yes Type of Wheelchair: Manual    Wheelchair assist level: Independent Max wheelchair distance: 247ft    Wheelchair 50 feet with 2 turns activity    Assist        Assist Level: Independent   Wheelchair 150 feet activity     Assist      Assist Level: Independent    Medical Problem List and Plan: 1.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 Status post transpedicular microdiscectomy T10-T11 02/25/2020. No brace required  Continue CIR  Decadron decreased to 4 twice daily on 12/8, decreased to daily on 12/13, decreased further on 12/21, stopped on 12/24  Repeat thoracolumbar x-ray on 12/16 stable without malalignment 2. Antithrombotics: -DVT/anticoagulation:SCDs.  Vascular studies reviewed negative for DVT -antiplatelet therapy: Aspirin 81 mg daily  Hemoglobin within normal limits on 12/20 3.  Postoperative pain:  12/24: pain well controlled off medications- d/ced oxycodone  Monitor with increased exertion 4. Mood:Provide emotional support -antipsychotic agents: N/A 5. Neuropsych: This patientiscapable of making decisions on hisown behalf. 6. Skin/Wound Care:Routine skin checks 7. Fluids/Electrolytes/Nutrition:Routine in and outs. 8. Hypertension.   Continue tenormin 25mg  daily and lotensin 10mg  daily  12/24: BP is well controlled to soft, decrease Lotensin to 5mg .  9. CAD with PTCA. Aspirin 81 mg daily. 10. Hyperlipidemia. Lipitor 11.  Hypoalbuminemia  Supplement initiated  12.  Steroid-induced hyperglycemia  Elevated on 12/20 13. Bradycardia: Does f/u with cardiology, has an appointment in January.   Controlled on 12/24- continue to monitor TID 14.  Elevated BUN/creatinine, likely CKD  Creatinine 1.47 on 12/20, improved 12/23. Placed nursing order to encourage hydration.   Encourage fluids 15. Urinary  retention  UA negative  PVRs unremarkable after initial elevated volume  Flomax ordered, changed to nightly due to orthostasis  Improving, continue to monitor  Improved 16. Orthostasis-new problem  Likely medication induced, see #15  Improving  Continue to monitor 17. Constipation  Continue bowel meds 18.  Leukocytosis-likely steroid-induced  WBC 10.5 on 12/21, 9.8 on 12/22, steroids stopped 12/24  Afebrile 19.  Acute blood loss anemia  Hemoglobin 12.5 on 12/21, up to 12.8 on 12/22 20.  Thrombocytopenia  Platelets 125 on 12/21, down to 121 on 12/22, down to 120 on 12/23, repeat Monday  LOS: 18 days A FACE TO FACE EVALUATION WAS PERFORMED  1/23 Leanore Biggers 03/17/2020, 7:51 AM

## 2020-03-18 ENCOUNTER — Inpatient Hospital Stay (HOSPITAL_COMMUNITY): Payer: Medicare Other

## 2020-03-18 ENCOUNTER — Inpatient Hospital Stay (HOSPITAL_COMMUNITY): Payer: Medicare Other | Admitting: Occupational Therapy

## 2020-03-18 MED ORDER — SENNOSIDES-DOCUSATE SODIUM 8.6-50 MG PO TABS
2.0000 | ORAL_TABLET | Freq: Two times a day (BID) | ORAL | Status: DC
  Administered 2020-03-18 – 2020-03-22 (×9): 2 via ORAL
  Filled 2020-03-18 (×11): qty 2

## 2020-03-18 NOTE — Progress Notes (Addendum)
Timothy Ware PHYSICAL MEDICINE & REHABILITATION PROGRESS NOTE  Subjective/Complaints:  Some constipation, strained at stool  ROS: denies CP, SOB, N/V/D  Objective: Vital Signs: Blood pressure 109/68, pulse 99, temperature 98.4 F (36.9 C), temperature source Oral, resp. rate 19, height 6\' 1"  (1.854 m), weight 84.5 kg, SpO2 95 %. No results found. Recent Labs    03/16/20 0413  WBC 8.8  HGB 12.1*  HCT 36.7*  PLT 120*   Recent Labs    03/16/20 0413  NA 136  K 4.0  CL 105  CO2 23  GLUCOSE 101*  BUN 34*  CREATININE 1.26*  CALCIUM 8.2*    Intake/Output Summary (Last 24 hours) at 03/18/2020 0804 Last data filed at 03/18/2020 0400 Gross per 24 hour  Intake 450 ml  Output 575 ml  Net -125 ml     Physical Exam: BP 109/68 (BP Location: Right Arm)   Pulse 99   Temp 98.4 F (36.9 C) (Oral)   Resp 19   Ht 6\' 1"  (1.854 m)   Wt 84.5 kg   SpO2 95%   BMI 24.57 kg/m   General: No acute distress Mood and affect are appropriate Heart: Regular rate and rhythm no rubs murmurs or extra sounds Lungs: Clear to auscultation, breathing unlabored, no rales or wheezes Abdomen: Positive bowel sounds, soft nontender to palpation, nondistended Extremities: No clubbing, cyanosis, or edema Skin: No evidence of breakdown, no evidence of rash Neurologic: Cranial nerves II through XII intact, motor strength is 5/5 in bilateral deltoid, bicep, tricep, grip, hip flexor, knee extensors, ankle dorsiflexor and plantar flexor Sensory exam normal sensation to light touch and proprioception in bilateral upper and lower extremities Cerebellar exam normal finger to nose to finger as well as heel to shin in bilateral upper and lower extremities Musculoskeletal: Full range of motion in all 4 extremities. No joint swelling  Left lower extremity: Hip flexion, knee extension 4-4+/5, ankle dorsiflexion 5/5, unchanged Right lower extremity: Hip flexion, knee extension 4/5, ankle dorsiflexion 5/5,  unchanged Sensation diminished to light touch right lower extremity >> left lower extremity, improving  Assessment/Plan: 1. Functional deficits which require 3+ hours per day of interdisciplinary therapy in a comprehensive inpatient rehab setting.  Physiatrist is providing close team supervision and 24 hour management of active medical problems listed below.  Physiatrist and rehab team continue to assess barriers to discharge/monitor patient progress toward functional and medical goals   Care Tool:  Bathing    Body parts bathed by patient: Right arm,Left arm,Chest,Abdomen,Front perineal area,Buttocks,Right upper leg,Left upper leg,Face   Body parts bathed by helper: Left lower leg,Right lower leg Body parts n/a: Right lower leg,Left lower leg   Bathing assist Assist Level: Minimal Assistance - Patient > 75%     Upper Body Dressing/Undressing Upper body dressing   What is the patient wearing?: Pull over shirt    Upper body assist Assist Level: Set up assist    Lower Body Dressing/Undressing Lower body dressing      What is the patient wearing?: Pants     Lower body assist Assist for lower body dressing: Minimal Assistance - Patient > 75%     Toileting Toileting    Toileting assist Assist for toileting: Minimal Assistance - Patient > 75% Assistive Device Comment:  (urinal)   Transfers Chair/bed transfer  Transfers assist     Chair/bed transfer assist level: Supervision/Verbal cueing (lateral scoot)     Locomotion Ambulation   Ambulation assist      Assist level: 2  helpers (max A of 1 and +2 w/c follow @ L hallway rail) Assistive device: Other (comment) (L hallway rail) Max distance: 57ft   Walk 10 feet activity   Assist  Walk 10 feet activity did not occur: Safety/medical concerns  Assist level: 2 helpers (max A of 1 and +2 w/c follow @ L hallway rail) Assistive device: Other (comment) (L hallway rail)   Walk 50 feet activity   Assist Walk  50 feet with 2 turns activity did not occur: Safety/medical concerns         Walk 150 feet activity   Assist Walk 150 feet activity did not occur: Safety/medical concerns         Walk 10 feet on uneven surface  activity   Assist Walk 10 feet on uneven surfaces activity did not occur: Safety/medical concerns         Wheelchair     Assist Will patient use wheelchair at discharge?: Yes Type of Wheelchair: Manual    Wheelchair assist level: Independent Max wheelchair distance: 29ft    Wheelchair 50 feet with 2 turns activity    Assist        Assist Level: Independent   Wheelchair 150 feet activity     Assist      Assist Level: Independent    Medical Problem List and Plan: 1.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 Status post transpedicular microdiscectomy T10-T11 02/25/2020. No brace required  Continue CIR  Decadron decreased to 4 twice daily on 12/8, decreased to daily on 12/13, decreased further on 12/21, stopped on 12/24  Repeat thoracolumbar x-ray on 12/16 stable without malalignment 2. Antithrombotics: -DVT/anticoagulation:SCDs.  Vascular studies reviewed negative for DVT -antiplatelet therapy: Aspirin 81 mg daily  Hemoglobin within normal limits on 12/20 3.  Postoperative pain:  12/24: pain well controlled off medications- d/ced oxycodone  Monitor with increased exertion 4. Mood:Provide emotional support -antipsychotic agents: N/A 5. Neuropsych: This patientiscapable of making decisions on hisown behalf. 6. Skin/Wound Care:Routine skin checks 7. Fluids/Electrolytes/Nutrition:Routine in and outs. 8. Hypertension.   Continue tenormin 25mg  daily and lotensin 10mg  daily  12/24: BP is well controlled to soft, decrease Lotensin to 5mg .  9. CAD with PTCA. Aspirin 81 mg daily. 10. Hyperlipidemia. Lipitor 11.  Hypoalbuminemia  Supplement initiated  12.  Steroid-induced  hyperglycemia  Elevated on 12/20 13. Bradycardia: Does f/u with cardiology, has an appointment in January.   Controlled on 12/24- continue to monitor TID 14.  Elevated BUN/creatinine, likely CKD  Creatinine 1.47 on 12/20, improved 12/23. Placed nursing order to encourage hydration.   Encourage fluids 15. Urinary retention  UA negative  PVRs unremarkable after initial elevated volume  Flomax ordered, changed to nightly due to orthostasis  Improving, continue to monitor  Improved 16. Orthostasis-new problem  Likely medication induced, see #15  Improving  Continue to monitor 17. Constipation  Continue bowel meds- check KUB to assess stool burden, change colace to senna S may need enema  18.  Leukocytosis-likely steroid-induced  WBC 10.5 on 12/21, 9.8 on 12/22, steroids stopped 12/24  Afebrile 19.  Acute blood loss anemia- mild  Hemoglobin 12.5 on 12/21, up to 12.8 on 12/22 20.  Thrombocytopenia  Platelets 125 on 12/21, down to 121 on 12/22, down to 120 on 12/23, repeat Monday  LOS: 19 days A FACE TO FACE EVALUATION WAS PERFORMED  1/23 03/18/2020, 8:04 AM

## 2020-03-18 NOTE — Plan of Care (Signed)
  Problem: Consults Goal: RH GENERAL PATIENT EDUCATION Description: See Patient Education module for education specifics. Outcome: Progressing Goal: Skin Care Protocol Initiated - if Braden Score 18 or less Description: Skin will remain intact while on unit. Outcome: Progressing   Problem: RH BOWEL ELIMINATION Goal: RH STG MANAGE BOWEL W/MEDICATION W/ASSISTANCE Description: STG Manage Bowel with Medication with Assistance until discharge. Outcome: Progressing   Problem: RH SKIN INTEGRITY Goal: RH STG SKIN FREE OF INFECTION/BREAKDOWN Description: Patient's skin will remain intact until discharge. Outcome: Progressing   Problem: RH SAFETY Goal: RH STG ADHERE TO SAFETY PRECAUTIONS W/ASSISTANCE/DEVICE Description: STG Adhere to Safety Precautions With Assistance/Device until discharge. Outcome: Progressing Goal: RH STG DECREASED RISK OF FALL WITH ASSISTANCE Description: STG Decreased Risk of Fall With Assistance until discharge. Outcome: Progressing   Problem: RH PAIN MANAGEMENT Goal: RH STG PAIN MANAGED AT OR BELOW PT'S PAIN GOAL Description: Patient will have a pain goal of 2 out of 10 on pain scale. Outcome: Progressing   Problem: RH Pre-functional/Other (Specify) Goal: RH LTG Pre-functional (Specify) Outcome: Progressing Goal: RH LTG Interdisciplinary (Specify) 1 Description: RH LTG Interdisciplinary (Specify)1 Outcome: Progressing Goal: RH LTG Interdisciplinary (Specify) 2 Description: RH LTG Interdisciplinary (Specify) 2  Outcome: Progressing   

## 2020-03-19 ENCOUNTER — Inpatient Hospital Stay (HOSPITAL_COMMUNITY): Payer: Medicare Other | Admitting: Occupational Therapy

## 2020-03-19 ENCOUNTER — Inpatient Hospital Stay (HOSPITAL_COMMUNITY): Payer: Medicare Other

## 2020-03-19 LAB — URINALYSIS, ROUTINE W REFLEX MICROSCOPIC
Bilirubin Urine: NEGATIVE
Glucose, UA: NEGATIVE mg/dL
Ketones, ur: NEGATIVE mg/dL
Leukocytes,Ua: NEGATIVE
Nitrite: NEGATIVE
Protein, ur: NEGATIVE mg/dL
Specific Gravity, Urine: 1.006 (ref 1.005–1.030)
pH: 6 (ref 5.0–8.0)

## 2020-03-19 NOTE — Progress Notes (Addendum)
Timothy Ware PHYSICAL MEDICINE & REHABILITATION PROGRESS NOTE  Subjective/Complaints:  connstipation improving, now with increased difficulty using the urinal, able to urinate on toilet, denies pain with urination   ROS: denies CP, SOB, N/V/D  Objective: Vital Signs: Blood pressure 127/71, pulse 92, temperature 98.6 F (37 C), temperature source Oral, resp. rate 16, height 6\' 1"  (1.854 m), weight 84.5 kg, SpO2 95 %. DG Abd 1 View  Result Date: 03/18/2020 CLINICAL DATA:  Constipation. EXAM: ABDOMEN - 1 VIEW COMPARISON:  None. FINDINGS: No gaseous distention of small bowel or colon. No substantial stool volume. Surgical changes with fusion hardware noted lumbar spine. IMPRESSION: Nonobstructive bowel gas pattern without substantial colonic stool burden. Electronically Signed   By: 03/20/2020 M.D.   On: 03/18/2020 09:31   No results for input(s): WBC, HGB, HCT, PLT in the last 72 hours. No results for input(s): NA, K, CL, CO2, GLUCOSE, BUN, CREATININE, CALCIUM in the last 72 hours.  Intake/Output Summary (Last 24 hours) at 03/19/2020 0723 Last data filed at 03/18/2020 2105 Gross per 24 hour  Intake 456 ml  Output 225 ml  Net 231 ml     Physical Exam: BP 127/71 (BP Location: Right Arm)   Pulse 92   Temp 98.6 F (37 C) (Oral)   Resp 16   Ht 6\' 1"  (1.854 m)   Wt 84.5 kg   SpO2 95%   BMI 24.57 kg/m   General: No acute distress Mood and affect are appropriate Heart: Regular rate and rhythm no rubs murmurs or extra sounds Lungs: Clear to auscultation, breathing unlabored, no rales or wheezes Abdomen: Positive bowel sounds, mild suprapubic tenderness to palpation, nondistended Extremities: No clubbing, cyanosis, or edema Skin: No evidence of breakdown, no evidence of rash Neurologic: Cranial nerves II through XII intact, motor strength is 5/5 in bilateral deltoid, bicep, tricep, grip, hip flexor, knee extensors, ankle dorsiflexor and plantar flexor Sensory exam normal  sensation to light touch and proprioception in bilateral upper and lower extremities Cerebellar exam normal finger to nose to finger as well as heel to shin in bilateral upper and lower extremities Musculoskeletal: Full range of motion in all 4 extremities. No joint swelling  Left lower extremity: Hip flexion, knee extension 4-4+/5, ankle dorsiflexion 5/5, unchanged Right lower extremity:3- Hip flexion,3-  knee extension 4/5, ankle dorsiflexion 5/5, unchanged Sensation diminished to light touch right lower extremity >> left lower extremity, improving  Assessment/Plan: 1. Functional deficits which require 3+ hours per day of interdisciplinary therapy in a comprehensive inpatient rehab setting.  Physiatrist is providing close team supervision and 24 hour management of active medical problems listed below.  Physiatrist and rehab team continue to assess barriers to discharge/monitor patient progress toward functional and medical goals   Care Tool:  Bathing    Body parts bathed by patient: Right arm,Left arm,Chest,Abdomen,Front perineal area,Buttocks,Right upper leg,Left upper leg,Face   Body parts bathed by helper: Left lower leg,Right lower leg Body parts n/a: Right lower leg,Left lower leg   Bathing assist Assist Level: Minimal Assistance - Patient > 75%     Upper Body Dressing/Undressing Upper body dressing   What is the patient wearing?: Pull over shirt    Upper body assist Assist Level: Set up assist    Lower Body Dressing/Undressing Lower body dressing      What is the patient wearing?: Pants     Lower body assist Assist for lower body dressing: Minimal Assistance - Patient > 75%     Toileting Toileting  Toileting assist Assist for toileting: Minimal Assistance - Patient > 75% Assistive Device Comment:  (urinal)   Transfers Chair/bed transfer  Transfers assist     Chair/bed transfer assist level: Supervision/Verbal cueing (lateral scoot)      Locomotion Ambulation   Ambulation assist      Assist level: 2 helpers (max A of 1 and +2 w/c follow @ L hallway rail) Assistive device: Other (comment) (L hallway rail) Max distance: 61ft   Walk 10 feet activity   Assist  Walk 10 feet activity did not occur: Safety/medical concerns  Assist level: 2 helpers (max A of 1 and +2 w/c follow @ L hallway rail) Assistive device: Other (comment) (L hallway rail)   Walk 50 feet activity   Assist Walk 50 feet with 2 turns activity did not occur: Safety/medical concerns         Walk 150 feet activity   Assist Walk 150 feet activity did not occur: Safety/medical concerns         Walk 10 feet on uneven surface  activity   Assist Walk 10 feet on uneven surfaces activity did not occur: Safety/medical concerns         Wheelchair     Assist Will patient use wheelchair at discharge?: Yes Type of Wheelchair: Manual    Wheelchair assist level: Independent Max wheelchair distance: 213ft    Wheelchair 50 feet with 2 turns activity    Assist        Assist Level: Independent   Wheelchair 150 feet activity     Assist      Assist Level: Independent    Medical Problem List and Plan: 1.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 Status post transpedicular microdiscectomy T10-T11 02/25/2020. No brace required  Continue CIR  Decadron decreased to 4 twice daily on 12/8, decreased to daily on 12/13, decreased further on 12/21, stopped on 12/24  Repeat thoracolumbar x-ray on 12/16 stable without malalignment 2. Antithrombotics: -DVT/anticoagulation:SCDs.  Vascular studies reviewed negative for DVT -antiplatelet therapy: Aspirin 81 mg daily  Hemoglobin within normal limits on 12/20 3.  Postoperative pain:  12/24: pain well controlled off medications- d/ced oxycodone  Monitor with increased exertion 4. Mood:Provide emotional support -antipsychotic  agents: N/A 5. Neuropsych: This patientiscapable of making decisions on hisown behalf. 6. Skin/Wound Care:Routine skin checks 7. Fluids/Electrolytes/Nutrition:Routine in and outs. 8. Hypertension.   Continue tenormin 25mg  daily and lotensin 10mg  daily  12/24: BP is well controlled to soft, decrease Lotensin to 5mg .  9. CAD with PTCA. Aspirin 81 mg daily. 10. Hyperlipidemia. Lipitor 11.  Hypoalbuminemia  Supplement initiated  12.  Steroid-induced hyperglycemia  Elevated on 12/20 13. Bradycardia: Does f/u with cardiology, has an appointment in January.   Controlled on 12/24- continue to monitor TID 14.  Elevated BUN/creatinine, likely CKD  Creatinine 1.47 on 12/20, improved 12/23. Placed nursing order to encourage hydration.   Encourage fluids 15. Urinary retention  UA negative  PVRs unremarkable after initial elevated volume  Flomax ordered, changed to nightly due to orthostasis  C/o problems with using urinal will recheck UA C and S - reviewed meds no  anticholinergic meds 16. Orthostasis-new problem  Likely medication induced, see #15  Improving  Continue to monitor 17. Constipation  Continue bowel meds- check KUB to assess stool burden, change colace to senna S may need enema  18.  Leukocytosis-likely steroid-induced  WBC 10.5 on 12/21, 9.8 on 12/22, steroids stopped 12/24  Afebrile 19.  Acute blood loss anemia- mild  Hemoglobin 12.5  on 12/21, up to 12.8 on 12/22 20.  Thrombocytopenia  Platelets 125 on 12/21, down to 121 on 12/22, down to 120 on 12/23, repeat Monday  LOS: 20 days A FACE TO FACE EVALUATION WAS PERFORMED  Erick Colace 03/19/2020, 7:23 AM

## 2020-03-19 NOTE — Progress Notes (Signed)
Physical Therapy Session Note  Patient Details  Name: Timothy Ware MRN: [id number] Date of Birth: 1934-10-01  Today's Date: 03/19/2020 PT Individual Time: 1300-1410 PT Individual Time Calculation (min): 70 min   Short Term Goals: Week 3:  PT Short Term Goal 1 (Week 3): Pt will be Independent w/squat pivot transfers to/from bed, will verbalize instructions for  set up assist if needed. PT Short Term Goal 2 (Week 3): mod I wc propulsion on mildly uneven surfaces PT Short Term Goal 3 (Week 3): Pt will ambulate 60ft w/LRAD and mod assist, wc follow  Skilled Therapeutic Interventions/Progress Updates:    Patient received supine in bed, agreeable to PT. He denies pain. Patient able to come to sit edge of bed with supervision and transfer to wc via lateral scoot with close supervision and verbal cues for safety. Patient able to propel himself in wc independently to therapy gym and transfer to therapy mat with supervision. NMES applied to R LE anterior tibs, quads and hamstring to achieve AAROM. Patient with good response to NMES. He was able to demonstrate safe car transfer into sedan-height car using slideboard and CGA to enter car, supervision exiting car. Patient returning to room in wc, transferring to bed via lateral scoot with supervision. Bed alarm on, call light within reach.   Therapy Documentation Precautions:  Precautions Precautions: Fall,Back Precaution Comments: General post-op back precautions Restrictions Weight Bearing Restrictions: No    Therapy/Group: Individual Therapy  Elizebeth Koller, PT, DPT, CBIS 03/19/2020, 7:36 AM

## 2020-03-19 NOTE — Progress Notes (Signed)
Occupational Therapy Session Note  Patient Details  Name: Timothy Ware MRN: [id number] Date of Birth: [birth year]  Today's Date: 03/19/2020 OT Individual Time: 0900-1000 OT Individual Time Calculation (min): 60 min    Short Term Goals: Week 1:  OT Short Term Goal 1 (Week 1): Pt will complete toileting with min assist OT Short Term Goal 1 - Progress (Week 1): Progressing toward goal OT Short Term Goal 2 (Week 1): Pt will complete toilet transfer with CGA OT Short Term Goal 2 - Progress (Week 1): Progressing toward goal OT Short Term Goal 3 (Week 1): Pt will complete LB dressing with min assist OT Short Term Goal 3 - Progress (Week 1): Progressing toward goal OT Short Term Goal 4 (Week 1): Pt will complete LB bathing with min assist OT Short Term Goal 4 - Progress (Week 1): Progressing toward goal Week 2:  OT Short Term Goal 1 (Week 2): Pt will complete toilet transfer with CGA. OT Short Term Goal 1 - Progress (Week 2): Met OT Short Term Goal 2 (Week 2): Pt will be able to manage clothing over his hips at toilet with min A. OT Short Term Goal 2 - Progress (Week 2): Progressing toward goal OT Short Term Goal 3 (Week 2): Pt will be able to don pants with min A. OT Short Term Goal 3 - Progress (Week 2): Met Week 3:  OT Short Term Goal 1 (Week 3): STGs =LTGs due to ELOS  Skilled Therapeutic Interventions/Progress Updates:    Pt received in bed with clothes on from yesterday. He stated he did not have another pair and his wife was bringing more today, later on. He did not want to bathe so he will need to do that tomorrow.   He did need to have a BM right away, so in the interest of getting him to the toilet quickly - used the stedy vs trying to complete 2 transfers.  Pt used stedy with close S to stand and CGA to sit safely onto toilet. Had a BM and able to urinate.  Total A with clean up and clothing as pt stood in stedy.  Transferred back to wc.   Pt then wanted to sit at sink to complete  grooming.  He then was agreeable to going to the gym to working on weight shifting and lateral leans to enhance his strength for clothing management on the [facility].  Pt stated that most of the time he will doff pants in bed then transfer to [facility].  Suggested he also know how to do [facility] as he may need to do a w/c to [facility] transfer. Pt completed sq pivot wc to therapy mat with Supervision.  Using a gait belt on a loose setting, he worked on lateral leans/wt shifts to doff and don belt 2 x from waist to thighs.  S from mat to w.c. He self propelled back to his room.  Pt in room with belt alarm on and all needs met.   Therapy Documentation Precautions:  Precautions Precautions: Fall,Back Precaution Comments: General post-op back precautions Restrictions Weight Bearing Restrictions: No    Pain:   no c/o pain    Therapy/Group: Individual Therapy  Kristin Lamagna 03/19/2020, 7:54 AM

## 2020-03-19 NOTE — Plan of Care (Signed)
  Problem: Consults Goal: RH GENERAL PATIENT EDUCATION Description: See Patient Education module for education specifics. Outcome: Progressing Goal: Skin Care Protocol Initiated - if Braden Score 18 or less Description: Skin will remain intact while on unit. Outcome: Progressing   Problem: RH BOWEL ELIMINATION Goal: RH STG MANAGE BOWEL W/MEDICATION W/ASSISTANCE Description: STG Manage Bowel with Medication with Assistance until discharge. Outcome: Progressing   Problem: RH SKIN INTEGRITY Goal: RH STG SKIN FREE OF INFECTION/BREAKDOWN Description: Patient's skin will remain intact until discharge. Outcome: Progressing   Problem: RH SAFETY Goal: RH STG ADHERE TO SAFETY PRECAUTIONS W/ASSISTANCE/DEVICE Description: STG Adhere to Safety Precautions With Assistance/Device until discharge. Outcome: Progressing Goal: RH STG DECREASED RISK OF FALL WITH ASSISTANCE Description: STG Decreased Risk of Fall With Assistance until discharge. Outcome: Progressing   Problem: RH PAIN MANAGEMENT Goal: RH STG PAIN MANAGED AT OR BELOW PT'S PAIN GOAL Description: Patient will have a pain goal of 2 out of 10 on pain scale. Outcome: Progressing   Problem: RH Pre-functional/Other (Specify) Goal: RH LTG Pre-functional (Specify) Outcome: Progressing Goal: RH LTG Interdisciplinary (Specify) 1 Description: RH LTG Interdisciplinary (Specify)1 Outcome: Progressing Goal: RH LTG Interdisciplinary (Specify) 2 Description: RH LTG Interdisciplinary (Specify) 2  Outcome: Progressing   

## 2020-03-20 ENCOUNTER — Inpatient Hospital Stay (HOSPITAL_COMMUNITY): Payer: Medicare Other

## 2020-03-20 ENCOUNTER — Inpatient Hospital Stay (HOSPITAL_COMMUNITY): Payer: Medicare Other | Admitting: Occupational Therapy

## 2020-03-20 LAB — URINE CULTURE

## 2020-03-20 MED ORDER — BISACODYL 10 MG RE SUPP: 10.0000 mg | Freq: Every day | RECTAL | 0 refills | Status: AC | PRN

## 2020-03-20 MED ORDER — SENNOSIDES-DOCUSATE SODIUM 8.6-50 MG PO TABS: 2.0000 | ORAL_TABLET | Freq: Two times a day (BID) | ORAL | Status: AC

## 2020-03-20 MED ORDER — TAMSULOSIN HCL 0.4 MG PO CAPS
0.8000 mg | ORAL_CAPSULE | Freq: Every day | ORAL | Status: DC
  Administered 2020-03-20 – 2020-03-21 (×2): 0.8 mg via ORAL
  Filled 2020-03-20 (×2): qty 2

## 2020-03-20 MED ORDER — TAMSULOSIN HCL 0.4 MG PO CAPS: 0.8000 mg | ORAL_CAPSULE | Freq: Every day | ORAL | 0 refills | Status: DC

## 2020-03-20 MED ORDER — POLYETHYLENE GLYCOL 3350 17 G PO PACK: 17.0000 g | PACK | Freq: Every day | ORAL | 0 refills | Status: DC | PRN

## 2020-03-20 MED ORDER — ATORVASTATIN CALCIUM 40 MG PO TABS: 20.0000 mg | ORAL_TABLET | Freq: Every day | ORAL | 0 refills | Status: DC

## 2020-03-20 MED ORDER — ACETAMINOPHEN 325 MG PO TABS: 650.0000 mg | ORAL_TABLET | ORAL | Status: AC | PRN

## 2020-03-20 MED ORDER — PANTOPRAZOLE SODIUM 40 MG PO TBEC: 40.0000 mg | DELAYED_RELEASE_TABLET | Freq: Every day | ORAL | 0 refills | Status: DC

## 2020-03-20 MED ORDER — METHOCARBAMOL 500 MG PO TABS: 500.0000 mg | ORAL_TABLET | Freq: Four times a day (QID) | ORAL | 0 refills | Status: DC | PRN

## 2020-03-20 MED ORDER — VITAMIN D 50 MCG (2000 UT) PO TABS: 2000.0000 [IU] | ORAL_TABLET | Freq: Every day | ORAL | 0 refills | Status: DC

## 2020-03-20 MED ORDER — BENAZEPRIL HCL 5 MG PO TABS: 5.0000 mg | ORAL_TABLET | Freq: Every day | ORAL | 0 refills | Status: DC

## 2020-03-20 MED ORDER — TRAMADOL HCL 50 MG PO TABS
50.0000 mg | ORAL_TABLET | Freq: Four times a day (QID) | ORAL | 0 refills | Status: DC | PRN
Start: 2020-03-20 — End: 2020-05-31

## 2020-03-20 MED ORDER — ATENOLOL 25 MG PO TABS: 25.0000 mg | ORAL_TABLET | Freq: Every day | ORAL | 0 refills | Status: DC

## 2020-03-20 NOTE — Progress Notes (Signed)
Patient educated on urine culture specimen to be collect per MD's orders Will inform oncoming Nurse of oder,

## 2020-03-20 NOTE — Progress Notes (Signed)
Occupational Therapy Session Note  Patient Details  Name: Timothy Ware MRN: [id number] Date of Birth: 03/07/1935  Today's Date: 03/20/2020 OT Individual Time: 1500-1540 OT Individual Time Calculation (min): 40 min    Short Term Goals: Week 3:  OT Short Term Goal 1 (Week 3): STGs =LTGs due to ELOS  Skilled Therapeutic Interventions/Progress Updates:    Pt semi upright in bed, no c/o pain, agreeable to OT session.  Pt completed supine to sit with supervision and donned bilateral shoes requiring increased time and using cane to retrieve shoes from floor, donning with supervision using figure 4 positioning.  Pt completed squat pivot transfer EOB<>BSC with supervision requiring occasional VCs for BLE and LUE placement.  Pt pulled pants up/down over hips using lateral and anterior leans with supervision and increased time.  Pt completed second trial squat pivot transfer EOB<>BSC for reinforcement of cueing required and demonstrated improved technique this time.  Pt doffed shoes with mod I and sit to supine with supervision.  Call bell in reach, bed alarm on.  Therapy Documentation Precautions:  Precautions Precautions: Fall,Back Precaution Comments: General post-op back precautions Restrictions Weight Bearing Restrictions: No   Therapy/Group: Individual Therapy  Amie Critchley 03/20/2020, 4:22 PM

## 2020-03-20 NOTE — Progress Notes (Signed)
Occupational Therapy Session Note  Patient Details  Name: Timothy Ware MRN: [id number] Date of Birth: 1934-09-19  Today's Date: 03/20/2020 OT Individual Time: 8546-2703 OT Individual Time Calculation (min): 60 min    Short Term Goals: Week 2:  OT Short Term Goal 1 (Week 2): Pt will complete toilet transfer with CGA. OT Short Term Goal 1 - Progress (Week 2): Met OT Short Term Goal 2 (Week 2): Pt will be able to manage clothing over his hips at toilet with min A. OT Short Term Goal 2 - Progress (Week 2): Progressing toward goal OT Short Term Goal 3 (Week 2): Pt will be able to don pants with min A. OT Short Term Goal 3 - Progress (Week 2): Met Week 3:  OT Short Term Goal 1 (Week 3): STGs =LTGs due to ELOS  Skilled Therapeutic Interventions/Progress Updates:    patient in bed, alert and ready for therapy session.  He denies pain.   He is able to donn shoes with set up in supine position.   Supine to sitting edge of bed with CS.  Sit pivot transfer bed to w/c CS with assist to set up w/c. UB bathing and dressing w/c level with set up.  He declined lower body bathing or to change pants this session.  Grooming tasks mod I w/c level.  Sit pivot transfer to/from drop arm commode CGA/set up of w/c.  Mod A pants down due to urgency, continent of bowel and bladder.   Hygiene - CS/set up , pants up - CGA (using grab bar)    He is able to propel w/c to and from therapy gym.  Completed  Upper body ergometer x 12 minutes.  Sit pivot transfer w.c to bed set up/CS.  Sit to supine with CS.  He remained in bed at close of session, bed alarm set and call bell in hand.      Therapy Documentation Precautions:  Precautions Precautions: Fall,Back Precaution Comments: General post-op back precautions Restrictions Weight Bearing Restrictions: No   Therapy/Group: Individual Therapy  Carlos Levering 03/20/2020, 7:28 AM

## 2020-03-20 NOTE — Progress Notes (Signed)
Physical Therapy Session Note  Patient Details  Name: Timothy Ware MRN: [id number] Date of Birth: 08-21-1934  Today's Date: 03/20/2020 PT Individual Time: 1300-1410 PT Individual Time Calculation (min): 70 min   Short Term Goals: Week 3:  PT Short Term Goal 1 (Week 3): Pt will be Independent w/squat pivot transfers to/from bed, will verbalize instructions for  set up assist if needed. PT Short Term Goal 2 (Week 3): mod I wc propulsion on mildly uneven surfaces PT Short Term Goal 3 (Week 3): Pt will ambulate 62ft w/LRAD and mod assist, wc follow  Skilled Therapeutic Interventions/Progress Updates:     Patient in bed with his wife in the room upon PT arrival. Patient alert and agreeable to PT session. Patient denied pain during session. Jason Persons, ATP present for custom w/c evaluation x30 min during session.   Per w/c evaluation, recommending ultra light, single frame w/c with fold away foot plate for ease of transfers, Roho cushion for improved pressure distribution in sitting due to lack of sensation and evidence of patient skin breakdown on sacrum. Ultra light wheelchair will improve patient's independence with w/c propulsion, improve energy conservation, reduce width and weight of the chair for improved household mobility and caregiver management for transport.   Educated on w/c acquisition process, insurance coverage, use of a transport chair with community mobility for reduced caregiver burden to lift the w/c into the vehicle, and patient's wife reported that patient will have intermittent access to a w/c accessible van from a friend.   Therapeutic Activity: Bed Mobility: Patient performed supine to sit with mod I using bed rail.  Transfers: Patient performed squat pivot transfer bed<>w/c with supervision for safety. Provided verbal cues for hand placement. Patient demonstrated w/c set-up with 1 cue for both transfers.   Patient unable to recall pressure relief schedule, however  demonstrated technique. Performed w/c push up with 5-10 sec hold x2 with increased effort. Educated on performing 5 slow w/c push-ups or lateral leans R/L 2x30 sec for reduced fatigue with pressure relief. Patient demonstrated both techniques with min cues. Noted Roho cushion needed inflating, <1/2" of space between patient's ischial tuberosity and seat of the w/c. Obtained Roho pump and educated patient and his wife on how and when to pump up a Roho cushion. Instructed patient to be sure that he received a pump with his cushion at d/c.   Educated on importance of maintaining skin integrity and performing regular skin assessments due to decreased sensation. Patient and his wife stated understanding.    Patient sitting EOB awaiting NT to arrive for toileting at end of session with breaks locked, bed alarm set, and all needs within reach.    Therapy Documentation Precautions:  Precautions Precautions: Fall,Back Precaution Comments: General post-op back precautions Restrictions Weight Bearing Restrictions: No   Therapy/Group: Individual Therapy  Stephanos Fan L Demmi Sindt PT, DPT  03/20/2020, 7:58 PM

## 2020-03-20 NOTE — Progress Notes (Signed)
[facility] PHYSICAL MEDICINE & REHABILITATION PROGRESS NOTE  Subjective/Complaints: Patient seen laying in bed this AM.  He states he slept fairly overnight.  He states urination is "so-so".  ROS: Denies CP, SOB, N/V/D  Objective: Vital Signs: Blood pressure 108/60, pulse 79, temperature 98.4 F (36.9 C), temperature source Oral, resp. rate 18, height 6\' 1"  (1.854 m), weight 84.5 kg, SpO2 97 %. No results found. No results for input(s): WBC, HGB, HCT, PLT in the last 72 hours. No results for input(s): NA, K, CL, CO2, GLUCOSE, BUN, CREATININE, CALCIUM in the last 72 hours.  Intake/Output Summary (Last 24 hours) at 03/20/2020 1211 Last data filed at 03/20/2020 0700 Gross per 24 hour  Intake 440 ml  Output 1200 ml  Net -760 ml     Physical Exam: BP 108/60 (BP Location: Right Arm)   Pulse 79   Temp 98.4 F (36.9 C) (Oral)   Resp 18   Ht 6\' 1"  (1.854 m)   Wt 84.5 kg   SpO2 97%   BMI 24.57 kg/m  Constitutional: No distress . Vital signs reviewed. HENT: Normocephalic.  Atraumatic. Eyes: EOMI. No discharge. Cardiovascular: No JVD.  RRR. Respiratory: Normal effort.  No stridor.  Bilateral clear to auscultation. GI: Non-distended.  BS +. Skin: Warm and dry.  Intact. Psych: Normal mood.  Normal behavior. Musc: No edema in extremities.  No tenderness in extremities. Neuro: Alert Motor: 5/5 throughout Left lower extremity: Hip flexion, knee extension 4+/5, ankle dorsiflexion 5/5, unchanged Right lower extremity: 4/5 Hip flexion, 4/5  knee extension, 5/5 ankle dorsiflexion Sensation diminished to light touch right > left lower extremity  Assessment/Plan: 1. Functional deficits which require 3+ hours per day of interdisciplinary therapy in a comprehensive inpatient rehab setting.  Physiatrist is providing close team supervision and 24 hour management of active medical problems listed below.  Physiatrist and rehab team continue to assess barriers to discharge/monitor patient  progress toward functional and medical goals   Care Tool:  Bathing    Body parts bathed by patient: Right arm,Left arm,Chest,Abdomen,Front perineal area,Buttocks,Right upper leg,Left upper leg,Face   Body parts bathed by helper: Left lower leg,Right lower leg Body parts n/a: Right lower leg,Left lower leg   Bathing assist Assist Level: Minimal Assistance - Patient > 75%     Upper Body Dressing/Undressing Upper body dressing   What is the patient wearing?: Pull over shirt    Upper body assist Assist Level: Set up assist    Lower Body Dressing/Undressing Lower body dressing      What is the patient wearing?: Pants     Lower body assist Assist for lower body dressing: Minimal Assistance - Patient > 75%     Toileting Toileting    Toileting assist Assist for toileting: Minimal Assistance - Patient > 75% Assistive Device Comment:  (urinal)   Transfers Chair/bed transfer  Transfers assist     Chair/bed transfer assist level: Supervision/Verbal cueing     Locomotion Ambulation   Ambulation assist      Assist level: 2 helpers (max A of 1 and +2 w/c follow @ L hallway rail) Assistive device: Other (comment) (L hallway rail) Max distance: 21ft   Walk 10 feet activity   Assist  Walk 10 feet activity did not occur: Safety/medical concerns  Assist level: 2 helpers (max A of 1 and +2 w/c follow @ L hallway rail) Assistive device: Other (comment) (L hallway rail)   Walk 50 feet activity   Assist Walk 50 feet with 2  turns activity did not occur: Safety/medical concerns         Walk 150 feet activity   Assist Walk 150 feet activity did not occur: Safety/medical concerns         Walk 10 feet on uneven surface  activity   Assist Walk 10 feet on uneven surfaces activity did not occur: Safety/medical concerns         Wheelchair     Assist Will patient use wheelchair at discharge?: Yes Type of Wheelchair: Manual    Wheelchair assist  level: Independent Max wheelchair distance: 276ft    Wheelchair 50 feet with 2 turns activity    Assist        Assist Level: Independent   Wheelchair 150 feet activity     Assist      Assist Level: Independent    Medical Problem List and Plan: 1.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 Status post transpedicular microdiscectomy T10-T11 02/25/2020. No brace required  Continue CIR  Decadron decreased to 4 twice daily on 12/8, decreased to daily on 12/13, decreased further on 12/21, stopped on 12/24  Repeat thoracolumbar x-ray on 12/16 stable without malalignment 2. Antithrombotics: -DVT/anticoagulation:SCDs.  Vascular studies reviewed negative for DVT -antiplatelet therapy: Aspirin 81 mg daily   Hemoglobin within normal limits on 12/20 3.  Postoperative pain:  Controlled on 12/27  Monitor with increased exertion 4. Mood:Provide emotional support -antipsychotic agents: N/A 5. Neuropsych: This patientiscapable of making decisions on hisown behalf. 6. Skin/Wound Care:Routine skin checks 7. Fluids/Electrolytes/Nutrition:Routine in and outs. 8. Hypertension.   Continue tenormin 25mg  daily   Lotensin 10mg  daily decreased to 5 mg   Relatively controlled on 12/27 9. CAD with PTCA. Aspirin 81 mg daily. 10. Hyperlipidemia. Lipitor 11.  Hypoalbuminemia  Supplement initiated  12.  Steroid-induced hyperglycemia  Mild elevated on 5/23 13. Bradycardia: Does f/u with cardiology, has an appointment in January.   Controlled on 12/27- continue to monitor TID 14.  Elevated BUN/creatinine, likely CKD  Creatinine 1.26 on 12/23  Encourage fluids 15. Urinary retention  UA negative  PVRs unremarkable after initial elevated volume  Flomax ordered, changed to nightly due to orthostasis, increased on 12/27  UA relatively unremarkable, urine culture pending 16. Orthostasis-new problem  Likely medication induced, see  #15  Improving  Continue to monitor 17. Constipation  KUB relatively unremarkable,, changed colace to senna S may need enema  18.  Leukocytosis-likely steroid-induced: Resolved  WBC 8.8 on 12/23  Afebrile 19.  Acute blood loss anemia- mild  Hemoglobin 12.1 on 12/23 20.  Thrombocytopenia  Platelets 120 on 12/23, labs ordered for tomorrow  LOS: 21 days A FACE TO FACE EVALUATION WAS PERFORMED  Marcee Jacobs 1/24 03/20/2020, 12:11 PM

## 2020-03-20 NOTE — Progress Notes (Signed)
Occupational Therapy Session Note  Patient Details  Name: Timothy Ware MRN: [id number] Date of Birth: 25-Jan-1935  Today's Date: 03/20/2020 OT Individual Time: 1030-1100 OT Individual Time Calculation (min): 30 min   Skilled Therapeutic Interventions/Progress Updates:    Pt greeted in bed and premedicated for pain. ADL needs met. He wanted to work on his UB strength to increase his independence with functional transfers. Pt transferred to EOB with min cues for back precautions. OT first guided him through gentle UB stretches of shoulders, elbows, and wrists as a preparatory activity. Then pt completed UE exercises using the 2# bar x10-20 reps, exercises modified to accommodate for ROM deficits on the Lt side. He required several rest breaks due to UE weakness. At end of session pt wanted to remain sitting EOB to talk with his visitor. Left him with all needs within reach and bed alarm set.     Therapy Documentation Precautions:  Precautions Precautions: Fall,Back Precaution Comments: General post-op back precautions Restrictions Weight Bearing Restrictions: No ADL: ADL Grooming: Setup (sitting at w/c) Where Assessed-Grooming: Sitting at sink Upper Body Bathing: Setup Where Assessed-Upper Body Bathing: Sitting at sink Lower Body Bathing: Moderate assistance Where Assessed-Lower Body Bathing: Sitting at sink,Standing at sink Upper Body Dressing: Setup Where Assessed-Upper Body Dressing: Sitting at sink Lower Body Dressing: Moderate assistance Where Assessed-Lower Body Dressing: Sitting at sink,Standing at sink Toileting: Maximal assistance Where Assessed-Toileting: Other (Comment) (3 in 1 commode over toilet) Toilet Transfer: Moderate assistance Toilet Transfer Method: Squat pivot Toilet Transfer Equipment: Drop arm bedside commode      Therapy/Group: Individual Therapy  [name] A [name] 03/20/2020, 12:29 PM

## 2020-03-20 NOTE — Discharge Summary (Addendum)
Physician Discharge Summary  Patient ID: Timothy Ware MRN: [id number] DOB/AGE: [birth year] 85 y.o.  Admit date: 02/28/2020 Discharge date: 03/22/2020  Discharge Diagnoses:  Principal Problem:   HNP (herniated nucleus pulposus with myelopathy), thoracic Active Problems:   Thoracic myelopathy   Hypoalbuminemia due to protein-calorie malnutrition [facility])   Essential hypertension   Postoperative pain   Steroid-induced hyperglycemia   Labile blood pressure   Elevated serum creatinine   Bradycardia   Elevated BUN   Neurogenic bladder   Elevated creatine kinase   Orthostatic hypotension   Leucocytosis   Acute blood loss anemia   Thrombocytopenia (HCC) Hyperlipidemia CAD/PTCA  Discharged Condition: Stable  Significant Diagnostic Studies: DG THORACOLUMABAR SPINE  Result Date: 03/09/2020 CLINICAL DATA:  Chronic back pain EXAM: THORACOLUMBAR SPINE 1V COMPARISON:  February 25, 2020. FINDINGS: No evidence of acute fracture or traumatic malalignment. S-shaped thoracolumbar curvature. Vertebral body heights are maintained. Posterior fusion hardware spanning L2 through L4 without evidence of hardware complication. There is evidence of some osseous fusion across the disc spaces. There is adjacent level degenerative change at L1-L2 with disc height loss, endplate sclerosis, and endplate spurring. There is also facet hypertrophy at this level. Atherosclerotic calcifications. IMPRESSION: 1. No evidence of acute fracture or traumatic malalignment. 2. L2 through L4 posterior fusion with adjacent level L1-L2 degenerative changes. Electronically Signed   By: Feliberto Harts MD   On: 03/09/2020 15:53   DG Thoracolumabar Spine  Result Date: 02/25/2020 CLINICAL DATA:  Intraoperative images for localization EXAM: THORACOLUMBAR SPINE 1V COMPARISON:  03/05/2018 FINDINGS: Two lateral views of the spine centered at the thoracolumbar junction are obtained. The first image demonstrates surgical instrumentation  posterior to the T11 level, image limited by technique. The second image demonstrates surgical instrumentation posterior to the T11 vertebral body, overlying the expected region of the central canal. Posterior fusion hardware from L2 through L4 unchanged. IMPRESSION: 1. Intraoperative exam as above. Electronically Signed   By: Sharlet Salina M.D.   On: 02/25/2020 15:13   DG Abd 1 View  Result Date: 03/18/2020 CLINICAL DATA:  Constipation. EXAM: ABDOMEN - 1 VIEW COMPARISON:  None. FINDINGS: No gaseous distention of small bowel or colon. No substantial stool volume. Surgical changes with fusion hardware noted lumbar spine. IMPRESSION: Nonobstructive bowel gas pattern without substantial colonic stool burden. Electronically Signed   By: Kennith Center M.D.   On: 03/18/2020 09:31   VAS [place] LOWER EXTREMITY VENOUS (DVT)  Result Date: 03/01/2020  Lower Venous DVT Study Indications: Swelling, and Edema.  Comparison Study: no prior Performing Technologist: Blanch Media RVS  Examination Guidelines: A complete evaluation includes B-mode imaging, spectral Doppler, color Doppler, and power Doppler as needed of all accessible portions of each vessel. Bilateral testing is considered an integral part of a complete examination. Limited examinations for reoccurring indications may be performed as noted. The reflux portion of the exam is performed with the patient in reverse Trendelenburg.  +---------+---------------+---------+-----------+----------+--------------+ RIGHT    CompressibilityPhasicitySpontaneityPropertiesThrombus Aging +---------+---------------+---------+-----------+----------+--------------+ CFV      Full           Yes      Yes                                 +---------+---------------+---------+-----------+----------+--------------+ SFJ      Full                                                        +---------+---------------+---------+-----------+----------+--------------+  FV Prox  Full                                                         +---------+---------------+---------+-----------+----------+--------------+ FV Mid   Full                                                        +---------+---------------+---------+-----------+----------+--------------+ FV DistalFull                                                        +---------+---------------+---------+-----------+----------+--------------+ PFV      Full                                                        +---------+---------------+---------+-----------+----------+--------------+ POP      Full           Yes      Yes                                 +---------+---------------+---------+-----------+----------+--------------+ PTV      Full                                                        +---------+---------------+---------+-----------+----------+--------------+ PERO     Full                                                        +---------+---------------+---------+-----------+----------+--------------+   +---------+---------------+---------+-----------+----------+--------------+ LEFT     CompressibilityPhasicitySpontaneityPropertiesThrombus Aging +---------+---------------+---------+-----------+----------+--------------+ CFV      Full           Yes      Yes                                 +---------+---------------+---------+-----------+----------+--------------+ SFJ      Full                                                        +---------+---------------+---------+-----------+----------+--------------+ FV Prox  Full                                                        +---------+---------------+---------+-----------+----------+--------------+  FV Mid   Full                                                        +---------+---------------+---------+-----------+----------+--------------+ FV DistalFull                                                         +---------+---------------+---------+-----------+----------+--------------+ PFV      Full                                                        +---------+---------------+---------+-----------+----------+--------------+ POP      Full           Yes      Yes                                 +---------+---------------+---------+-----------+----------+--------------+ PTV      Full                                                        +---------+---------------+---------+-----------+----------+--------------+ PERO     Full                                                        +---------+---------------+---------+-----------+----------+--------------+     Summary: BILATERAL: - No evidence of deep vein thrombosis seen in the lower extremities, bilaterally. - No evidence of superficial venous thrombosis in the lower extremities, bilaterally. -No evidence of popliteal cyst, bilaterally.   *See table(s) above for measurements and observations. Electronically signed by Lemar LivingsBrandon Cain MD on 03/01/2020 at 3:14:39 PM.    Final     Labs:  Basic Metabolic Panel: Recent Labs  Lab 03/16/20 0413  NA 136  K 4.0  CL 105  CO2 23  GLUCOSE 101*  BUN 34*  CREATININE 1.26*  CALCIUM 8.2*    CBC: Recent Labs  Lab 03/15/20 0420 03/16/20 0413  WBC 9.8 8.8  NEUTROABS 7.4 6.5  HGB 12.8* 12.1*  HCT 36.8* 36.7*  MCV 93.9 94.8  PLT 121* 120*    CBG: No results for input(s): GLUCAP in the last 168 hours.  Family history. Positive hypertension hyperlipidemia. Denies any colon cancer esophageal cancer rectal cancer  Brief HPI:   Timothy Ware is a 85 y.o. right-handed male with history of CAD/PTCA maintained on aspirin. Patient lives with spouse independent with assistive device. 1 level home one-step to entry. Family assistance is needed. Presented 02/24/2020 with progressive weakness back pain as well as numbness lower extremities right greater than left x2 days. X-rays and  imaging revealed thoracic disc herniation T10-T11 with thoracic myelopathy and canal  stenosis. Underwent transpedicular microdiscectomy thoracic T10-11 02/25/2020 per Dr. Venetia Maxon. Decadron taper as directed. No brace required. Admission chemistries unremarkable except creatinine 1.34. No postoperative labs have been ordered. Tolerating a regular diet. Due to patient's decreased functional mobility weakness lower extremities he was admitted for a comprehensive rehab program.   Hospital Course: Timothy Ware was admitted to rehab 02/28/2020 for inpatient therapies to consist of PT, ST and OT at least three hours five days a week. Past admission physiatrist, therapy team and rehab RN have worked together to provide customized collaborative inpatient rehab. Pertaining to patient's thoracic myelopathy disc herniation status post microdiscectomy T10-T11 02/25/2020. Surgical site healing nicely no back brace required neurovascular sensation intact patient would follow-up with neurosurgery. Decadron taper as directed. SCDs for DVT prophylaxis venous Doppler studies negative he had been on low-dose aspirin but discontinued due to thrombocytopenia 104 and monitored. Blood pressure controlled on Tenormin as well as Lotensin he would follow-up outpatient with his primary MD. History of CAD with PTCA low-dose aspirin as advised. Lipitor for hyperlipidemia. Bouts of bradycardia he does follow with cardiology services no chest pain or shortness of breath continue low-dose Tenormin. Creatinine 1.47 likely CKD follow-up outpatient.   Blood pressures were monitored on TID basis and soft and monitored      Rehab course: During patient's stay in rehab weekly team conferences were held to monitor patient's progress, set goals and discuss barriers to discharge. At admission, patient required max assist stand pivot transfers moderate assist sit to supine minimal assist side-lying to sitting. Minimal assist upper body bathing max is  lower body bathing minimal assist upper body dressing max is lower body dressing mod assist toilet transfers  Physical exam. Blood pressure 155/74 pulse 77 temperature 97.3 respirations 20 oxygen saturation 9 9% room air Constitutional. No acute distress HEENT Head. Normocephalic and atraumatic Eyes. Pupils round and reactive to light no discharge without nystagmus Neck. Supple nontender no JVD without thyromegaly Cardiac regular rate rhythm without extra sounds or murmur heard Abdomen. Soft nontender positive bowel sounds without rebound Extremities. No clubbing cyanosis or edema Neurologic. Cranial nerves II through XII intact motor strength 5/5 bilateral deltoids biceps triceps grip hip flexors knee extensors ankle dorsi plantarflexion Skin. Warm and dry surgical site clean  He/  has had improvement in activity tolerance, balance, postural control as well as ability to compensate for deficits. He/ has had improvement in functional use RUE/LUE  and RLE/LLE as well as improvement in awareness. Patient able to come to sit edge of bed with supervision transfers to wheelchair lateral scoot close supervision. Able to propel wheelchair independently. He is able to demonstrate safe car transfers into sedan height contact-guard assist supervision. Perform squat pivot to wheelchair from level bed as patient was able to perform from lower bed height supervision. Patient used steady with close supervision to standing contact-guard to sit safety on the toilet for ADLs. He did need assistance for clean up hygiene. Full family teaching completed plan discharge to home       Disposition: Discharged to home    Diet: Regular  Special Instructions: No driving smoking or alcohol  Medications at discharge 1. Tylenol as needed 2. Tenormin 25 mg p.o. daily 3. Lipitor 20 mg p.o. daily 4. B complex with vitamin C 1 tablet daily 5. Lotensin 5 mg p.o. daily 6. Dulcolax suppository daily as needed moderate  constipation 7. Vitamin D3 2000 units p.o. daily 8. Robaxin 500 mg every 6 hours as needed muscle spasms 9. Protonix  40 mg daily 10. Flomax 0.4 mg daily after supper 11. Senokot S2 tablets p.o. twice daily 12. Tramadol 50 mg every 6 hours as needed pain 13.  Urecholine 10 mg p.o. 3 times daily x1 week and stop 14.  Decadron 2 mg daily x3 days and stop   30-35 minutes were spent completing discharge summary and discharge planning  Discharge Instructions     Ambulatory referral to Physical Medicine Rehab   Complete by: As directed    Moderate complexity follow-up 1 to 2 weeks thoracic myelopathy        Follow-up Information     Marcello Fennel, MD Follow up.   Specialty: Physical Medicine and Rehabilitation Why: Office to call for appointment Contact information: [address] [place] [address] [phone]         Maeola Harman, MD Follow up.   Specialty: Neurosurgery Why: Call for appointment Contact information: [address] [place] [address] [phone]         Elise Benne, MD Follow up.   Specialty: Internal Medicine Contact information: [address] [place] [address] [phone]                 Signed: Charlton Amor 03/21/2020, 5:12 AM Patient was seen, face-face, and physical exam performed by me on day of discharge, greater than 30 minutes of total time spent.. Please see progress note from day of discharge as well.  Maryla Morrow, MD, ABPMR

## 2020-03-20 NOTE — Progress Notes (Addendum)
Patient ID: Timothy Ware, male   DOB: 14-Dec-1934, 86 y.o.   MRN: [id number] Have reached out to [facility] regarding hospital bed and drop-arm bedside commode. They are closed today and will open at 8:00 am tomorrow. Have faxed order to them. Will touch base with tomorrow when they re-open.  11:09 AM Spoke with Timothy Ware speaker phone along with pt and wife who are in the room to answer their questions regarding hospital bed and transfer board along with wheelchair. Wife and pt discussing going home in a wheelchair Timothy Ware they have acces too and pt wants to go in a car. They can decide this. Concern that [facility] is closed today and bed will need to go out tomorrow prior to his discharge home. Will call in am and then call son to let him know. Work toward discharge Wed. Son concerned about pt's voiding and pt reports this has resolved.

## 2020-03-21 ENCOUNTER — Inpatient Hospital Stay (HOSPITAL_COMMUNITY): Payer: Medicare Other

## 2020-03-21 ENCOUNTER — Inpatient Hospital Stay (HOSPITAL_COMMUNITY): Payer: Medicare Other | Admitting: Occupational Therapy

## 2020-03-21 ENCOUNTER — Inpatient Hospital Stay (HOSPITAL_COMMUNITY): Payer: Medicare Other | Admitting: Physical Therapy

## 2020-03-21 DIAGNOSIS — R339 Retention of urine, unspecified: Secondary | ICD-10-CM

## 2020-03-21 LAB — URINE CULTURE

## 2020-03-21 LAB — CBC WITH DIFFERENTIAL/PLATELET
Abs Immature Granulocytes: 0.04 10*3/uL (ref 0.00–0.07)
Basophils Absolute: 0 10*3/uL (ref 0.0–0.1)
Basophils Relative: 0 %
Eosinophils Absolute: 0.2 10*3/uL (ref 0.0–0.5)
Eosinophils Relative: 3 %
HCT: 35.7 % — ABNORMAL LOW (ref 39.0–52.0)
Hemoglobin: 12.4 g/dL — ABNORMAL LOW (ref 13.0–17.0)
Immature Granulocytes: 1 %
Lymphocytes Relative: 13 %
Lymphs Abs: 0.7 10*3/uL (ref 0.7–4.0)
MCH: 32.3 pg (ref 26.0–34.0)
MCHC: 34.7 g/dL (ref 30.0–36.0)
MCV: 93 fL (ref 80.0–100.0)
Monocytes Absolute: 0.4 10*3/uL (ref 0.1–1.0)
Monocytes Relative: 8 %
Neutro Abs: 4 10*3/uL (ref 1.7–7.7)
Neutrophils Relative %: 75 %
Platelets: 104 10*3/uL — ABNORMAL LOW (ref 150–400)
RBC: 3.84 MIL/uL — ABNORMAL LOW (ref 4.22–5.81)
RDW: 13 % (ref 11.5–15.5)
WBC: 5.3 10*3/uL (ref 4.0–10.5)
nRBC: 0 % (ref 0.0–0.2)

## 2020-03-21 MED ORDER — POLYETHYLENE GLYCOL 3350 17 G PO PACK: 17.0000 g | PACK | Freq: Two times a day (BID) | ORAL | 0 refills | Status: DC

## 2020-03-21 MED ORDER — POLYETHYLENE GLYCOL 3350 17 G PO PACK
17.0000 g | PACK | Freq: Two times a day (BID) | ORAL | Status: DC
  Administered 2020-03-21 (×2): 17 g via ORAL
  Filled 2020-03-21 (×2): qty 1

## 2020-03-21 MED ORDER — BETHANECHOL CHLORIDE 10 MG PO TABS: 10.0000 mg | ORAL_TABLET | Freq: Three times a day (TID) | ORAL | 0 refills | Status: DC

## 2020-03-21 MED ORDER — DEXAMETHASONE 2 MG PO TABS: 2.0000 mg | ORAL_TABLET | Freq: Every day | ORAL | 0 refills | Status: DC

## 2020-03-21 MED ORDER — BETHANECHOL CHLORIDE 10 MG PO TABS
10.0000 mg | ORAL_TABLET | Freq: Three times a day (TID) | ORAL | Status: DC
  Administered 2020-03-21 – 2020-03-22 (×4): 10 mg via ORAL
  Filled 2020-03-21 (×4): qty 1

## 2020-03-21 MED ORDER — DEXAMETHASONE 4 MG PO TABS
2.0000 mg | ORAL_TABLET | Freq: Every day | ORAL | Status: DC
  Administered 2020-03-21 – 2020-03-22 (×2): 2 mg via ORAL
  Filled 2020-03-21 (×2): qty 1

## 2020-03-21 NOTE — Progress Notes (Signed)
Occupational Therapy Discharge Summary  Patient Details  Name: Timothy Ware MRN: [id number] Date of Birth: November 18, 1934  Today's Date: 03/21/2020 OT Individual Time: 9833-8250 OT Individual Time Calculation (min): 55 min    Patient has met 6 of 6 long term goals due to improved activity tolerance, improved balance, postural control and ability to compensate for deficits.  Patient to discharge at overall Supervision level with occasional min assist for toileting.  Patient's care partner is independent to provide the necessary physical and cognitive assistance at discharge.    Recommendation:  Patient will benefit from ongoing skilled OT services in home health setting to continue to advance functional skills in the area of BADL.  Equipment: drop arm bedside commode, transfer board, hospital bed  Reasons for discharge: treatment goals met and discharge from hospital   Skilled Intervention: Pt supine in bed, reports very tired and weak due to needing to be catheterized in middle of night; agreeable to bathing/dressing at EOB during OT session. Also c/o "my muscles have been jumping" in RLE, which limited quality of sleep last night, per pt. Pt completed all bed mobility including supine<>sit and rolling to right with mod I using bed rail.  Pt completed UB bathing and dressing with setup at EOB.  Pt completed LB bathing and dressing sitting EOB using lateral leans as needed with overall supervision needing occasional VCs for problem solving.  Pt requesting to rest remainder of session due to fatigue.  Pt semi upright in bed, call bell in reach, bed alarm on.    Patient/family agrees with progress made and goals achieved: Yes  OT Discharge Precautions/Restrictions  Precautions Precautions: Back Precaution Comments: significant RLE weakness and impaired sensation Restrictions Weight Bearing Restrictions: No General OT Amount of Missed Time: 20 Minutes Pain Pain Assessment Pain Scale:  0-10 Pain Score: 4  Pain Type: Chronic pain Pain Location: Back Pain Orientation: Lower Pain Descriptors / Indicators: Aching Pain Onset: On-going Pain Intervention(s): Repositioned Multiple Pain Sites: No ADL ADL Eating: Independent Where Assessed-Eating: Wheelchair Grooming: Setup Where Assessed-Grooming: Edge of bed Upper Body Bathing: Setup Where Assessed-Upper Body Bathing: Edge of bed Lower Body Bathing: Supervision/safety,Setup Where Assessed-Lower Body Bathing: Edge of bed,Bed level Upper Body Dressing: Setup Where Assessed-Upper Body Dressing: Edge of bed Lower Body Dressing: Supervision/safety,Setup Where Assessed-Lower Body Dressing: Edge of bed,Bed level Toileting: Minimal assistance Where Assessed-Toileting: Bedside Commode Toilet Transfer: Close supervision Toilet Transfer Method: Engineer, water: Drop arm bedside commode Vision Baseline Vision/History: Wears glasses Wears Glasses: Reading only Patient Visual Report: No change from baseline Vision Assessment?: No apparent visual deficits Perception  Perception: Within Functional Limits Praxis Praxis: Impaired Praxis Impairment Details: Motor planning Praxis-Other Comments: RLE Cognition Overall Cognitive Status: Within Functional Limits for tasks assessed Arousal/Alertness: Awake/alert Orientation Level: Oriented X4 Attention: Sustained;Selective Sustained Attention: Appears intact Selective Attention: Appears intact Memory: Appears intact Awareness: Appears intact Problem Solving: Appears intact Safety/Judgment: Appears intact Sensation Sensation Light Touch: Impaired by gross assessment Hot/Cold: Appears Intact Proprioception: Impaired by gross assessment;Impaired Detail Proprioception Impaired Details: Impaired RLE;Impaired LLE Stereognosis: Not tested Coordination Gross Motor Movements are Fluid and Coordinated: No Fine Motor Movements are Fluid and Coordinated:  Yes Coordination and Movement Description: decreased precision movements due to lack of sensation and proprioception with decreased strength RLE Motor  Motor Motor: Paraplegia;Abnormal postural alignment and control;Other (comment) Motor - Discharge Observations: Decreased B LE proprioception and absent R LE sensation resulting in impaired foot placement and motor planning with functional mobility Mobility  Bed Mobility  Bed Mobility: Rolling Right;Rolling Left;Supine to Sit;Sit to Supine Rolling Right: Independent with assistive device Rolling Left: Independent with assistive device Supine to Sit: Independent with assistive device Sit to Supine: Independent with assistive device  Trunk/Postural Assessment  Cervical Assessment Cervical Assessment: Exceptions to [facility] (forward head) Thoracic Assessment Thoracic Assessment: Exceptions to [facility] (slight kyphosis) Lumbar Assessment Lumbar Assessment: Exceptions to [facility] (posterior pelvic tilt) Postural Control Postural Control: Deficits on evaluation (decreased/delayed)  Balance Balance Balance Assessed: Yes Static Sitting Balance Static Sitting - Balance Support: Feet supported;No upper extremity supported Static Sitting - Level of Assistance: 7: Independent Dynamic Sitting Balance Dynamic Sitting - Balance Support: Feet supported;During functional activity Dynamic Sitting - Level of Assistance: 5: Stand by assistance Static Standing Balance Static Standing - Balance Support: No upper extremity supported Static Standing - Level of Assistance: 3: Mod assist Extremity/Trunk Assessment RUE Assessment RUE Assessment: Within Functional Limits LUE Assessment LUE Assessment: Within Functional Limits   Timothy Ware 03/21/2020, 12:33 PM

## 2020-03-21 NOTE — Progress Notes (Signed)
[name] PHYSICAL MEDICINE & REHABILITATION PROGRESS NOTE  Subjective/Complaints: Patient seen in bed this morning.  He states he slept fairly overnight because he had to be I/O cathed.  He also notes constipation.  Encourage patient to attempt to sit up to urinate versus laying down.  ROS: + Urinary retention, constipation.  Denies CP, SOB, N/V/D  Objective: Vital Signs: Blood pressure (!) 142/82, pulse 90, temperature 98.2 F (36.8 C), resp. rate 18, height 6\' 1"  (1.854 m), weight 84.5 kg, SpO2 100 %. No results found. Recent Labs    03/21/20 0559  WBC 5.3  HGB 12.4*  HCT 35.7*  PLT 104*   No results for input(s): NA, K, CL, CO2, GLUCOSE, BUN, CREATININE, CALCIUM in the last 72 hours.  Intake/Output Summary (Last 24 hours) at 03/21/2020 0851 Last data filed at 03/21/2020 0700 Gross per 24 hour  Intake 580 ml  Output 900 ml  Net -320 ml     Physical Exam: BP (!) 142/82 (BP Location: Right Arm)   Pulse 90   Temp 98.2 F (36.8 C)   Resp 18   Ht 6\' 1"  (1.854 m)   Wt 84.5 kg   SpO2 100%   BMI 24.57 kg/m  Constitutional: No distress . Vital signs reviewed. HENT: Normocephalic.  Atraumatic. Eyes: EOMI. No discharge. Cardiovascular: No JVD.  RRR. Respiratory: Normal effort.  No stridor.  Bilateral clear to auscultation. GI: Non-distended.  BS +. Skin: Warm and dry.  Intact. Psych: Normal mood.  Normal behavior. Musc: No edema in extremities.  No tenderness in extremities. Neuro: Alert Motor: 5/5 throughout Left lower extremity: Hip flexion, knee extension 4+/5, ankle dorsiflexion 5/5, unchanged Right lower extremity: 3+/5 Hip flexion, 4-/5  knee extension, 5/5 ankle dorsiflexion Sensation diminished to light touch right > left lower extremity  Assessment/Plan: 1. Functional deficits which require 3+ hours per day of interdisciplinary therapy in a comprehensive inpatient rehab setting.  Physiatrist is providing close team supervision and 24 hour management of  active medical problems listed below.  Physiatrist and rehab team continue to assess barriers to discharge/monitor patient progress toward functional and medical goals   Care Tool:  Bathing    Body parts bathed by patient: Right arm,Left arm,Chest,Abdomen,Front perineal area,Buttocks,Right upper leg,Left upper leg,Face   Body parts bathed by helper: Left lower leg,Right lower leg Body parts n/a: Right lower leg,Left lower leg   Bathing assist Assist Level: Minimal Assistance - Patient > 75%     Upper Body Dressing/Undressing Upper body dressing   What is the patient wearing?: Pull over shirt    Upper body assist Assist Level: Set up assist    Lower Body Dressing/Undressing Lower body dressing      What is the patient wearing?: Pants     Lower body assist Assist for lower body dressing: Minimal Assistance - Patient > 75%     Toileting Toileting    Toileting assist Assist for toileting: Minimal Assistance - Patient > 75% Assistive Device Comment:  (urinal)   Transfers Chair/bed transfer  Transfers assist     Chair/bed transfer assist level: Supervision/Verbal cueing     Locomotion Ambulation   Ambulation assist      Assist level: 2 helpers (max A of 1 and +2 w/c follow @ L hallway rail) Assistive device: Other (comment) (L hallway rail) Max distance: 43ft   Walk 10 feet activity   Assist  Walk 10 feet activity did not occur: Safety/medical concerns  Assist level: 2 helpers (max A of 1  and +2 w/c follow @ L hallway rail) Assistive device: Other (comment) (L hallway rail)   Walk 50 feet activity   Assist Walk 50 feet with 2 turns activity did not occur: Safety/medical concerns         Walk 150 feet activity   Assist Walk 150 feet activity did not occur: Safety/medical concerns         Walk 10 feet on uneven surface  activity   Assist Walk 10 feet on uneven surfaces activity did not occur: Safety/medical concerns          Wheelchair     Assist Will patient use wheelchair at discharge?: Yes Type of Wheelchair: Manual    Wheelchair assist level: Independent Max wheelchair distance: 276ft    Wheelchair 50 feet with 2 turns activity    Assist        Assist Level: Independent   Wheelchair 150 feet activity     Assist      Assist Level: Independent    Medical Problem List and Plan: 1.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 Status post transpedicular microdiscectomy T10-T11 02/25/2020. No brace required  Continue CIR, patient and family education  Decadron decreased to 4 twice daily on 12/8, decreased to daily on 12/13, decreased further on 12/21, stopped on 12/24, restarted on 12/28 due to?  Recurrence of associated symptoms  Repeat thoracolumbar x-ray on 12/16 stable without malalignment 2. Antithrombotics: -DVT/anticoagulation:SCDs.  Vascular studies reviewed negative for DVT -antiplatelet therapy: Aspirin 81 mg daily DC'd on 12/29   Hemoglobin within normal limits on 12/20 3.  Postoperative pain:  Controlled on 12/28  Monitor with increased exertion 4. Mood:Provide emotional support -antipsychotic agents: N/A 5. Neuropsych: This patientiscapable of making decisions on hisown behalf. 6. Skin/Wound Care:Routine skin checks 7. Fluids/Electrolytes/Nutrition:Routine in and outs. 8. Hypertension.   Continue tenormin 25mg  daily   Lotensin 10mg  daily decreased to 5 mg   Slightly labile on 12/28 9. CAD with PTCA. Aspirin 81 mg daily. 10. Hyperlipidemia. Lipitor 11.  Hypoalbuminemia  Supplement initiated  12.  Steroid-induced hyperglycemia  Mild elevated on 5/23 13. Bradycardia: Does f/u with cardiology, has an appointment in January.   Controlled on 12/27- continue to monitor TID 14.  Elevated BUN/creatinine, likely CKD  Creatinine 1.26 on 12/23  Encourage fluids 15. Urinary retention  UA negative  PVRs  unremarkable after initial elevated volume  Flomax ordered, changed to nightly due to orthostasis, increased on 12/27  Bethanechol 10 3 times daily started 12/28  UA relatively unremarkable, urine culture multiple species 16. Orthostasis-new problem  Likely medication induced, see #15  Improving  Continue to monitor 17. Constipation  KUB relatively unremarkable,, changed colace to senna   MiraLAX twice daily started on 12/28 18.  Leukocytosis-likely steroid-induced: Resolved  WBC 8.8 on 12/23  Afebrile 19.  Acute blood loss anemia- mild  Hemoglobin 12.4 on 12/28 20.  Thrombocytopenia  Platelets 104 on 12/28  LOS: 22 days A FACE TO FACE EVALUATION WAS PERFORMED  Timothy Ware 1/29 03/21/2020, 8:51 AM

## 2020-03-21 NOTE — Progress Notes (Signed)
Physical Therapy Discharge Summary  Patient Details  Name: Timothy Ware MRN: [id number] Date of Birth: [birth year]  Today's Date: 03/21/2020 PT Individual Time: 1305-1420 PT Individual Time Calculation (min): 75 min    Patient has met 7 of 9 long term goals due to improved activity tolerance, improved balance, improved postural control, increased strength, decreased pain and ability to compensate for deficits.  Patient to discharge at a wheelchair level [place].   Patient's care partner is independent to provide the necessary physical assistance at discharge.  Reasons goals not met: Gait and stair goals not appropriate due to lack of progress with gait during patient's stay. Patient to d/c at w/c level, patient and family in agreement with this and participated in education for mobility. Patient did not meet his 500 ft w/c propulsion distance in cotnroled and community environments, however, he is able to propel >250 ft at mod I level, which is more than adequate for his mobility needs at d/c.   Recommendation:  Patient will benefit from ongoing skilled PT services in home health setting to continue to advance safe functional mobility, address ongoing impairments in strength, proprioception, balance, sensation, functional mobility, gait, community integration, patient/caregiver education, and minimize fall risk.  Equipment: 18"x18" light weight w/c with back support, performed custome w/c evaluation for ultralight w/c for improved mobility, slide board  Reasons for discharge: lack of progress toward goals and treatment goals met  Patient/family agrees with progress made and goals achieved: Yes  Skilled Therapeutic Interventions: Session 1: Patient in bed upon PT arrival. Patient alert and agreeable to PT session. Patient denied pain during session. Reported intermittent "electrick shocks" in B lower extremities throughout the night. Educated on neuropathic pain with SCI recovery.    Patient also reported that he needed to be in/out catheterized last night due to being unable to void. MD rounded during discussion and made aware. Patient reported that he did not get to the [facility] to void. Educated on benefits of voiding in a sitting position. Patient also reported that he had not had a adequate BM in several days. Checked with RN, who reports patient had 2 medium BMs yesterday.   Performed d/c assessment, see details below, with patient in bed and sitting EOB due to patient reporting increased fatigue from lack of sleep.   Therapeutic Activity: Bed Mobility: Patient performed rolling R/L and supine to/from sit with mod I using bed rails. Patient does not adhere to back precautions with mobility, but states he is aware and choosing to perform mobility "the way that works."  Transfers: Patient performed sit to/from stand x2 with mod A for boosting up. Provided verbal cues for hand placement, foot placement, scooting forward, and gluteal activation for hip extension in standing. He performed a slide board transfer bed<>BSC with CGA and set-up assist. Required total A for board placement and cues for hand and foot placement during transfer. Educated on lifting his hips rather than scooting to maintain skin integrity with use of slide board. Patient was continent of bladder during toileting, see flowsheet for details. Performed peri-care and lower body dressing with total A for time/energy conservation.    Educated patient on fall risk/prevention, home modifications to prevent falls, and activation of emergency services in the event of a fall during session.   Patient in bed at end of session with breaks locked, bed alarm set, and all needs within reach.   Session 2: PTA reported that patient had refused loaner w/c from [employer]. Returned to discuss w/c  options with patient. Patient in the bed with his daughter in the room upon PT arrival. Patient reported that the chair that was brought  in felt "flemsy" and "unsafe." Standard 16"x18" light weight w/c was delivered by [employer] with standard cushion and back support. Patient performed bed mobility, as above. Performed squat pivot transfer bed<>w/c with supervision and cues for hand placement. Patient felt too far forward in the chair, educated on differences in 18"x20" and 18"x16" chair, will request 18"x18" chair for improved leg support in sitting, CSW made aware. Removed back rest for improved seat depth and educated patient on use of back rest as needed if deeper chair is provided. Educated patient and his daughter on w/c parts management and folding/unfolding w/c. Reinforced pressure relief education and encouraged every 15 min due to standard cushion providing less pressure distribution than [place] cushion. Patient's daughter reports that the patient with leave in a w/c accessible [employer]. Patient propelled the w/c to the nurses station with mod I. Patient left with his daughter at the nurses station at end of session, RN made aware. Patient satisfied with the w/c provided, pending increased seat depth.  PT Discharge Precautions/Restrictions Precautions Precautions: Back Precaution Comments: significant RLE weakness and impaired sensation Vision/Perception  Perception Perception: Within Functional Limits Praxis Praxis: Impaired Praxis Impairment Details: Motor planning Praxis-Other Comments: R LE  Cognition Overall Cognitive Status: Within Functional Limits for tasks assessed Arousal/Alertness: Awake/alert Sustained Attention: Appears intact Selective Attention: Appears intact Memory: Appears intact Awareness: Appears intact Problem Solving: Appears intact Safety/Judgment: Appears intact Sensation Sensation Light Touch: Impaired by gross assessment Hot/Cold: Not tested Proprioception: Impaired by gross assessment;Impaired Detail Proprioception Impaired Details: Impaired RLE;Impaired LLE (2/5 correct for testing at  great toe on L, unable to tell with testing at great toe on R) Stereognosis: Not tested Additional Comments: nerve pain with painful stimulus on R, unable to locate pain source, WFL to painful stimulus on L Coordination Gross Motor Movements are Fluid and Coordinated: No Fine Motor Movements are Fluid and Coordinated: Yes Coordination and Movement Description: decreased precision movements due to lack of sensation and proprioception with decreased strength RLE Heel Shin Test: Slow and deliberate on L, unable on R Motor  Motor Motor: Paraplegia;Abnormal postural alignment and control;Other (comment) Motor - Discharge Observations: Decreased B LE proprioception and absent R LE sensation resulting in impaired foot placement and motor planning with functional mobility  Mobility Bed Mobility Bed Mobility: Rolling Right;Rolling Left;Supine to Sit;Sit to Supine Rolling Right: Independent with assistive device Rolling Left: Independent with assistive device Supine to Sit: Independent with assistive device Sit to Supine: Independent with assistive device Transfers Transfers: Squat Pivot Transfers Sit to Stand: Moderate Assistance - Patient 50-74% Stand to Sit: Moderate Assistance - Patient 50-74% Stand Pivot Transfers: Moderate Assistance - Patient 50 - 74% Squat Pivot Transfers: Supervision/Verbal cueing Transfer (Assistive device): Rolling walker (no AD for squat pivot transfer) Locomotion  Gait Ambulation: Yes Gait Assistance: Dependent - Patient 0% (w/c follow for safety) Gait Distance (Feet): 8 Feet (70 feet dependent in Lite Gait) Assistive device: Parallel bars Gait Assistance Details: Tactile cues for placement;Tactile cues for weight beaing;Verbal cues for precautions/safety;Manual facilitation for weight shifting;Verbal cues for technique;Verbal cues for sequencing;Verbal cues for gait pattern;Visual cues/gestures for sequencing;Manual facilitation for placement Gait Gait:  Yes Gait Pattern: Impaired Gait Pattern: Step-to pattern;Decreased step length - right;Decreased stance time - right;Decreased weight shift to right;Right hip hike;Right genu recurvatum;Poor foot clearance - right Gait velocity: decreased Stairs / Additional Locomotion Stairs:  No Architect: Yes Wheelchair Assistance: Independent with Camera operator: Both upper extremities Wheelchair Parts Management: Independent Distance: >250 ft  Trunk/Postural Assessment  Cervical Assessment Cervical Assessment: Exceptions to [facility] (forward head) Thoracic Assessment Thoracic Assessment: Exceptions to WFL (slight kyphosis) Lumbar Assessment Lumbar Assessment: Exceptions to [facility] (posterior pelvic tilt) Postural Control Postural Control: Deficits on evaluation (decreased/delayed)  Balance Balance Balance Assessed: Yes Static Sitting Balance Static Sitting - Balance Support: Feet supported;No upper extremity supported Static Sitting - Level of Assistance: 7: Independent Dynamic Sitting Balance Dynamic Sitting - Balance Support: Feet supported;During functional activity Dynamic Sitting - Level of Assistance: 5: Stand by assistance Static Standing Balance Static Standing - Balance Support: No upper extremity supported Static Standing - Level of Assistance: 3: Mod assist Dynamic Standing Balance Dynamic Standing - Balance Support: Bilateral upper extremity supported Dynamic Standing - Level of Assistance: 3: Mod assist Extremity Assessment  RUE Assessment RUE Assessment: Within Functional Limits LUE Assessment LUE Assessment: Within Functional Limits RLE Assessment RLE Assessment: Exceptions to [facility] Active Range of Motion (AROM) Comments: unable to perform SLR consistently, decreased hip flexion in sitting and standing General Strength Comments: Grossly in sitting: ankle DF/PF 4+/5, knee ext 4+/5, Knee flex 4/5, hip flex 2/5 (limited by R side  low back pain with redicular symptoms) LLE Assessment LLE Assessment: Within Functional Limits Active Range of Motion (AROM) Comments: WFL for functional mobility General Strength Comments: Grossly in sitting: 4+ to 5/5 throughout    Greer Wainright L Elisama Thissen PT, DPT  03/21/2020, 4:17 PM

## 2020-03-21 NOTE — Progress Notes (Signed)
Patient daughter explained ride being available tomorrow at 10am for patient discharge. PA Dan aware. No complications noted at this time.  Jay Schlichter, LPN

## 2020-03-21 NOTE — Progress Notes (Deleted)
Patient arrived to unit with wife at bedside, no complications noted at this time. °Timothy Ware D Kelaiah Escalona, LPN °

## 2020-03-21 NOTE — Progress Notes (Addendum)
Patient ID: Timothy Ware, male   DOB: 1934/06/27, 85 y.o.   MRN: [id number] Wheelchair brought from [employer] pt does not like even with knowing it will be a loaner chair until his comes in. He would rather have a standard wheelchair. [facility] does not have nay of these and [place] he wold need to purchase one. Checking with [employer]. 2:30 Pm have found a 18x 18 with [employer] pt wants worker to get this chair and not worry about the cushion. Informed him it is unlikely he will get a roho cushion. He does not care about this. Daughter in the room and aware of pts decision.

## 2020-03-21 NOTE — Discharge Instructions (Signed)
Inpatient Rehab Discharge Instructions  Makyle Eslick Discharge date and time: No discharge date for patient encounter.   Activities/Precautions/ Functional Status: Activity: activity as tolerated Diet: regular diet Wound Care: Routine skin checks Functional status:  ___ No restrictions     ___ Walk up steps independently ___ 24/7 supervision/assistance   ___ Walk up steps with assistance ___ Intermittent supervision/assistance  ___ Bathe/dress independently ___ Walk with walker     _x__ Bathe/dress with assistance ___ Walk Independently    ___ Shower independently ___ Walk with assistance    ___ Shower with assistance ___ No alcohol     ___ Return to work/school ________  Special Instructions:  No driving smoking or alcohol   COMMUNITY REFERRALS UPON DISCHARGE:    Home Health:   PT, OT & RN [facility]  [phone]   Medical Equipment/Items Ordered: HOSPITAL [facility] [phone]   TRANSFER BOARD-STALL'S [phone] DROP-ARM BEDSIDE [facility] & [facility]  [phone]                                                   My questions have been answered and I understand these instructions. I will adhere to these goals and the provided educational materials after my discharge from the hospital.  Patient/Caregiver Signature _______________________________ Date __________  Clinician Signature _______________________________________ Date __________  Please bring this form and your medication list with you to all your follow-up doctor's appointments.

## 2020-03-21 NOTE — Progress Notes (Signed)
Physical Therapy Session Note  Patient Details  Name: Timothy Ware MRN: [id number] Date of Birth: [birth year]  Today's Date: 03/21/2020 PT Individual Time: 1305-1420 PT Individual Time Calculation (min): 75 min   Short Term Goals: Week 3:  PT Short Term Goal 1 (Week 3): Pt will be Independent w/squat pivot transfers to/from bed, will verbalize instructions for  set up assist if needed. PT Short Term Goal 2 (Week 3): mod I wc propulsion on mildly uneven surfaces PT Short Term Goal 3 (Week 3): Pt will ambulate 60f w/LRAD and mod assist, wc follow  Skilled Therapeutic Interventions/Progress Updates: Pt presented in bed agreeable to therapy with JCorene Cornea ADP [place] [employer] banker JCorene Corneaexplained set up of chair and preparation of Roho cushion as pt performed bed mobility with supervision and use of bed features. Pt performed squat pivot transfer to w/c with PTA providing verbal cues for set up and leg placement. Once pt in chair pt indicated was not happy with stability of chair and felt "uncomfortable" and "awkward". PTA and JCorene Corneatried to explain difference in chairs and JCorene Corneaalso explained that his chair would be slightly different. Pt re-iterated that he was not comfortable with chair and that he would rather keep standard chair although fit would be different. Explained to pt that he would not have hard back support (but would still have Roho cushion) with pt verbalizing understanding. JCorene Corneaand PTA communicated with Becky change in plans. Pt returned to bed via squat pivot with CGA and transferred supervision to standard chair. Pt then indicated need for possible BM, performed squat pivot transfer to toilet with wall rail and CGA with PTA providing minA as pt performed lateral leans to lower pants (+ urinary void only). Pt was able to pull pants back over hips with supervision and performed squat pivot to w/c and wall rail with CGA. Pt then propelled to ortho gym and  performed car transfer with use of SB and supervision. PTA noted that it required increased effort and noted exertion after activity. Pt then propelled back to room and performed squat pivot back to bed with supervision and returned to supine mod I with use of bed features. Pt left in bed at end of session with bed alarm on, call bell within reach and current needs met.      Therapy Documentation Precautions:  Precautions Precautions: Back Precaution Comments: significant RLE weakness and impaired sensation Restrictions Weight Bearing Restrictions: No General:   Vital Signs: Therapy Vitals Temp: 97.7 F (36.5 C) Pulse Rate: 79 Resp: 18 BP: (!) 121/59 Patient Position (if appropriate): Lying Oxygen Therapy SpO2: 97 % O2 Device: Room Air    Therapy/Group: Individual Therapy  Kyannah Climer  Naila Elizondo, PTA  03/21/2020, 4:11 PM

## 2020-03-21 NOTE — Progress Notes (Signed)
Patient ID: Timothy Ware, male   DOB: [birth year], 85 y.o.   MRN: [id number] Spoke with [facility] for hospital bed delivery have faxed order. Son at the business and working out delivery for hopefully today. Loaner wheelchair and transfer board here for home and drop-arm commode to be delivered to room prior to discharge home tomorrow.

## 2020-03-22 NOTE — Progress Notes (Signed)
[facility] PHYSICAL MEDICINE & REHABILITATION PROGRESS NOTE  Subjective/Complaints: Patient seen laying in bed this morning.  He states he slept fairly overnight because he received bowel medications late last night.  Wife at bedside.  Patient notes that he is urinating well now.  He states he had a bowel movement yesterday as well.  Is questions regarding prognosis.  He is ready for discharge.  ROS: Denies CP, SOB, N/V/D  Objective: Vital Signs: Blood pressure (!) 143/72, pulse 77, temperature 97.6 F (36.4 C), resp. rate 17, height 6\' 1"  (1.854 m), weight 84.5 kg, SpO2 100 %. No results found. Recent Labs    03/21/20 0559  WBC 5.3  HGB 12.4*  HCT 35.7*  PLT 104*   No results for input(s): NA, K, CL, CO2, GLUCOSE, BUN, CREATININE, CALCIUM in the last 72 hours.  Intake/Output Summary (Last 24 hours) at 03/22/2020 03/24/2020 Last data filed at 03/22/2020 0700 Gross per 24 hour  Intake 600 ml  Output 950 ml  Net -350 ml     Physical Exam: BP (!) 143/72 (BP Location: Right Arm)   Pulse 77   Temp 97.6 F (36.4 C)   Resp 17   Ht 6\' 1"  (1.854 m)   Wt 84.5 kg   SpO2 100%   BMI 24.57 kg/m  Constitutional: No distress . Vital signs reviewed. HENT: Normocephalic.  Atraumatic. Eyes: EOMI. No discharge. Cardiovascular: No JVD.  RRR. Respiratory: Normal effort.  No stridor.  Bilateral clear to auscultation. GI: Non-distended.  BS +. Skin: Warm and dry.  Intact. Psych: Normal mood.  Normal behavior. Musc: No edema in extremities.  No tenderness in extremities. Intrinsic hand atrophy Neuro: Alert Motor: 5/5 throughout Left lower extremity: Hip flexion, knee extension 4+/5, ankle dorsiflexion 5/5, unchanged Right lower extremity: 3+/5 Hip flexion, 4-/5  knee extension, 5/5 ankle dorsiflexion, unchanged Sensation diminished to light touch right > left lower extremity  Assessment/Plan: 1. Functional deficits which require 3+ hours per day of interdisciplinary therapy in a  comprehensive inpatient rehab setting.  Physiatrist is providing close team supervision and 24 hour management of active medical problems listed below.  Physiatrist and rehab team continue to assess barriers to discharge/monitor patient progress toward functional and medical goals   Care Tool:  Bathing    Body parts bathed by patient: Right arm,Left arm,Chest,Abdomen,Front perineal area,Buttocks,Right upper leg,Left upper leg,Face,Left lower leg,Right lower leg   Body parts bathed by helper: Left lower leg,Right lower leg Body parts n/a: Right lower leg,Left lower leg   Bathing assist Assist Level: Supervision/Verbal cueing (EOB/bed level)     Upper Body Dressing/Undressing Upper body dressing   What is the patient wearing?: Button up shirt,Pull over shirt    Upper body assist Assist Level: Set up assist    Lower Body Dressing/Undressing Lower body dressing      What is the patient wearing?: Pants     Lower body assist Assist for lower body dressing: Supervision/Verbal cueing     Toileting Toileting    Toileting assist Assist for toileting: Minimal Assistance - Patient > 75% Assistive Device Comment:  (urinal)   Transfers Chair/bed transfer  Transfers assist     Chair/bed transfer assist level: Supervision/Verbal cueing     Locomotion Ambulation   Ambulation assist   Ambulation activity did not occur: Safety/medical concerns  Assist level: Dependent - Patient 0% Assistive device: Lite Gait Max distance: 70 ft   Walk 10 feet activity   Assist  Walk 10 feet activity did not occur:  Safety/medical concerns  Assist level: Dependent - Patient 0% Assistive device: Lite Gait   Walk 50 feet activity   Assist Walk 50 feet with 2 turns activity did not occur: Safety/medical concerns  Assist level: Dependent - Patient 0% Assistive device: Lite Gait    Walk 150 feet activity   Assist Walk 150 feet activity did not occur: Safety/medical concerns  (decreased activity tolerance and LE strength)         Walk 10 feet on uneven surface  activity   Assist Walk 10 feet on uneven surfaces activity did not occur: Safety/medical concerns (decreased LE strenth/proprioception)         Wheelchair     Assist Will patient use wheelchair at discharge?: Yes Type of Wheelchair: Manual    Wheelchair assist level: Independent Max wheelchair distance: >250 ft    Wheelchair 50 feet with 2 turns activity    Assist        Assist Level: Independent   Wheelchair 150 feet activity     Assist      Assist Level: Independent    Medical Problem List and Plan: 1.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 Status post transpedicular microdiscectomy T10-T11 02/25/2020. No brace required  DC today  Will see patient for transitional care management in 1-2 weeks post-discharge  Decadron decreased to 4 twice daily on 12/8, decreased to daily on 12/13, decreased further on 12/21, stopped on 12/24, restarted on 12/28 due to?  Recurrence of associated symptoms  Repeat thoracolumbar x-ray on 12/16 stable without malalignment 2. Antithrombotics: -DVT/anticoagulation:SCDs.  Vascular studies reviewed negative for DVT -antiplatelet therapy: Aspirin 81 mg daily DC'd on 12/29 due to plts trending down   Hemoglobin within normal limits on 12/20 3.  Postoperative pain:  Controlled on 12/29  Monitor with increased exertion 4. Mood:Provide emotional support -antipsychotic agents: N/A 5. Neuropsych: This patientiscapable of making decisions on hisown behalf. 6. Skin/Wound Care:Routine skin checks 7. Fluids/Electrolytes/Nutrition:Routine in and outs. 8. Hypertension.   Continue tenormin 25mg  daily   Lotensin 10mg  daily decreased to 5 mg   Slightly labile on 12/29 9. CAD with PTCA. Aspirin 81 mg daily. 10. Hyperlipidemia. Lipitor 11.  Hypoalbuminemia  Supplement  initiated  12.  Steroid-induced hyperglycemia  Mild elevated on 5/23 13. Bradycardia: Does f/u with cardiology, has an appointment in January.   Controlled on 12/27- continue to monitor TID 14.  Elevated BUN/creatinine, likely CKD  Creatinine 1.26 on 12/23  Encourage fluids 15. Urinary retention  UA negative  PVRs unremarkable after initial elevated volume  Flomax ordered, changed to nightly due to orthostasis, increased on 12/27  Bethanechol 10 3 times daily started 12/28  UA relatively unremarkable, urine culture multiple species  Improving 16. Orthostasis-new problem  Likely medication induced, see #15  Improving  Continue to monitor 17. Constipation  KUB relatively unremarkable,, changed colace to senna   MiraLAX twice daily started on 12/28 18.  Leukocytosis-likely steroid-induced: Resolved  WBC 8.8 on 12/23  Afebrile 19.  Acute blood loss anemia- mild  Hemoglobin 12.4 on 12/28 20.  Thrombocytopenia  Platelets 104 on 12/28, will require follow-up labs as outpatient.  > 30 minutes spent in total in discharge planning between myself and PA regarding aforementioned, as well discussion regarding DME equipment, follow-up appointments, follow-up therapies, discharge medications, discharge recommendations  LOS: 23 days A FACE TO FACE EVALUATION WAS PERFORMED  Shalah Estelle 1/29 03/22/2020, 9:22 AM

## 2020-03-22 NOTE — Progress Notes (Signed)
Patient discharged home with family, all belongings sent with patient. No complications noted at this time. Aparna Vanderweele D Sumeet Geter, LPN 

## 2020-03-22 NOTE — Progress Notes (Signed)
Inpatient Rehabilitation Care Coordinator Discharge Note  The overall goal for the admission was met for:   Discharge location: Yes-HOME WITH WIFE AND SON TO ASSIST WITH HIS CARE-24/7  Length of Stay: Yes-23 DAYS  Discharge activity level: Yes-SUPERVISION WITH CUEING  Home/community participation: Yes  Services provided included: MD, RD, PT, OT, RN, CM, Pharmacy, Neuropsych and SW  Financial Services: Medicare and Private Insurance: [place] offered to/list presented to:YES  Follow-up services arranged: Home Health: [facility]-PT, OT, RN, DME: [facility]-WHEELCHAIR, DROP-ARM BEDSIDE [facility]. [facility] BED STALLS 30 TRANSFER BOARD and Patient/Family has no preference for HH/DME agencies PT AWARE BY GETTING WHEELCHAIR FROM [facility] THE [facility] WILL NOT BE ORDERED NOR WILL HE HAVE THE ROHO CUSHION-PT FINE WITH THIS  Comments (or additional information):FAMILY-WIFE AND SON IN FOR EDUCATION AND FEELS COMFORTABLE WITH CARE AT HOME  Patient/Family verbalized understanding of follow-up arrangements: Yes  Individual responsible for coordination of the follow-up plan: LINDA-WIFE [phone]  Confirmed correct DME delivered: Elease Hashimoto 03/22/2020    Reham Slabaugh, Gardiner Rhyme

## 2020-04-03 ENCOUNTER — Encounter: Payer: Medicare Other | Admitting: Physical Medicine & Rehabilitation

## 2020-04-25 ENCOUNTER — Other Ambulatory Visit (HOSPITAL_COMMUNITY): Payer: Self-pay | Admitting: Neurosurgery

## 2020-04-25 ENCOUNTER — Emergency Department (HOSPITAL_COMMUNITY): Payer: Medicare Other

## 2020-04-25 ENCOUNTER — Inpatient Hospital Stay (HOSPITAL_COMMUNITY)
Admission: EM | Admit: 2020-04-25 | Discharge: 2020-05-06 | DRG: 460 | Disposition: A | Discharge status: Rehabilitation Facility | Payer: Medicare Other | Attending: Neurosurgery | Admitting: Neurosurgery

## 2020-04-25 ENCOUNTER — Other Ambulatory Visit: Payer: Self-pay

## 2020-04-25 ENCOUNTER — Other Ambulatory Visit: Payer: Self-pay | Admitting: Neurosurgery

## 2020-04-25 DIAGNOSIS — I10 Essential (primary) hypertension: Secondary | ICD-10-CM | POA: Diagnosis present

## 2020-04-25 DIAGNOSIS — N3000 Acute cystitis without hematuria: Secondary | ICD-10-CM

## 2020-04-25 DIAGNOSIS — B958 Unspecified staphylococcus as the cause of diseases classified elsewhere: Secondary | ICD-10-CM | POA: Diagnosis present

## 2020-04-25 DIAGNOSIS — K5900 Constipation, unspecified: Secondary | ICD-10-CM | POA: Diagnosis present

## 2020-04-25 DIAGNOSIS — M5104 Intervertebral disc disorders with myelopathy, thoracic region: Secondary | ICD-10-CM | POA: Diagnosis present

## 2020-04-25 DIAGNOSIS — M4804 Spinal stenosis, thoracic region: Secondary | ICD-10-CM | POA: Diagnosis not present

## 2020-04-25 DIAGNOSIS — R29898 Other symptoms and signs involving the musculoskeletal system: Secondary | ICD-10-CM | POA: Diagnosis present

## 2020-04-25 DIAGNOSIS — N390 Urinary tract infection, site not specified: Secondary | ICD-10-CM | POA: Diagnosis present

## 2020-04-25 DIAGNOSIS — E785 Hyperlipidemia, unspecified: Secondary | ICD-10-CM | POA: Diagnosis present

## 2020-04-25 DIAGNOSIS — N318 Other neuromuscular dysfunction of bladder: Secondary | ICD-10-CM | POA: Diagnosis present

## 2020-04-25 DIAGNOSIS — Z419 Encounter for procedure for purposes other than remedying health state, unspecified: Secondary | ICD-10-CM

## 2020-04-25 DIAGNOSIS — N1831 Chronic kidney disease, stage 3a: Secondary | ICD-10-CM | POA: Diagnosis present

## 2020-04-25 DIAGNOSIS — R339 Retention of urine, unspecified: Secondary | ICD-10-CM | POA: Diagnosis present

## 2020-04-25 DIAGNOSIS — K592 Neurogenic bowel, not elsewhere classified: Secondary | ICD-10-CM | POA: Diagnosis present

## 2020-04-25 DIAGNOSIS — Z20822 Contact with and (suspected) exposure to covid-19: Secondary | ICD-10-CM | POA: Diagnosis present

## 2020-04-25 DIAGNOSIS — Z7952 Long term (current) use of systemic steroids: Secondary | ICD-10-CM

## 2020-04-25 DIAGNOSIS — G8918 Other acute postprocedural pain: Secondary | ICD-10-CM | POA: Diagnosis not present

## 2020-04-25 DIAGNOSIS — I2510 Atherosclerotic heart disease of native coronary artery without angina pectoris: Secondary | ICD-10-CM | POA: Diagnosis present

## 2020-04-25 DIAGNOSIS — R202 Paresthesia of skin: Secondary | ICD-10-CM | POA: Diagnosis present

## 2020-04-25 DIAGNOSIS — Z880 Allergy status to penicillin: Secondary | ICD-10-CM

## 2020-04-25 DIAGNOSIS — I129 Hypertensive chronic kidney disease with stage 1 through stage 4 chronic kidney disease, or unspecified chronic kidney disease: Secondary | ICD-10-CM | POA: Diagnosis present

## 2020-04-25 DIAGNOSIS — M5114 Intervertebral disc disorders with radiculopathy, thoracic region: Secondary | ICD-10-CM | POA: Diagnosis present

## 2020-04-25 LAB — BASIC METABOLIC PANEL
Anion gap: 13 (ref 5–15)
BUN: 18 mg/dL (ref 8–23)
CO2: 20 mmol/L — ABNORMAL LOW (ref 22–32)
Calcium: 8.8 mg/dL — ABNORMAL LOW (ref 8.9–10.3)
Chloride: 100 mmol/L (ref 98–111)
Creatinine, Ser: 1.22 mg/dL (ref 0.61–1.24)
GFR, Estimated: 59 mL/min — ABNORMAL LOW (ref >60)
Glucose, Bld: 121 mg/dL — ABNORMAL HIGH (ref 70–99)
Potassium: 3.9 mmol/L (ref 3.5–5.1)
Sodium: 133 mmol/L — ABNORMAL LOW (ref 135–145)

## 2020-04-25 LAB — CBC
HCT: 34.4 % — ABNORMAL LOW (ref 39.0–52.0)
Hemoglobin: 11.2 g/dL — ABNORMAL LOW (ref 13.0–17.0)
MCH: 30.4 pg (ref 26.0–34.0)
MCHC: 32.6 g/dL (ref 30.0–36.0)
MCV: 93.2 fL (ref 80.0–100.0)
Platelets: 280 10*3/uL (ref 150–400)
RBC: 3.69 MIL/uL — ABNORMAL LOW (ref 4.22–5.81)
RDW: 13.2 % (ref 11.5–15.5)
WBC: 10.8 10*3/uL — ABNORMAL HIGH (ref 4.0–10.5)
nRBC: 0 % (ref 0.0–0.2)

## 2020-04-25 MED ORDER — DIAZEPAM 5 MG PO TABS
5.0000 mg | ORAL_TABLET | Freq: Once | ORAL | Status: AC
  Administered 2020-04-26: 5 mg via ORAL
  Filled 2020-04-25: qty 1

## 2020-04-25 MED ORDER — ACETAMINOPHEN 500 MG PO TABS
1000.0000 mg | ORAL_TABLET | Freq: Once | ORAL | Status: AC
  Administered 2020-04-25: 1000 mg via ORAL
  Filled 2020-04-25: qty 2

## 2020-04-25 MED ORDER — FENTANYL CITRATE (PF) 100 MCG/2ML IJ SOLN
50.0000 ug | Freq: Once | INTRAMUSCULAR | Status: AC
  Administered 2020-04-26: 50 ug via INTRAVENOUS
  Filled 2020-04-25: qty 2

## 2020-04-25 NOTE — ED Triage Notes (Signed)
Pt arrives with [facility] from home with urinary retention. Pt had spinal surgery in December (Dr. Venetia Maxon). Pt has not urianted in about 2 days, has had decreased oral intake. cbg 125, 153/73, hr 65.

## 2020-04-25 NOTE — ED Notes (Signed)
Bladder scan showed 

## 2020-04-25 NOTE — ED Notes (Addendum)
Post void residual bladder scan resulted in 56 mL.

## 2020-04-25 NOTE — ED Triage Notes (Signed)
Pt says that he has only urinated a small amount today. He says for two days has felt like he is urinating, but he is not. Pt reports spinal surgery in December, has been unable to ambulate other than transfer from w/c to bed. Pt denies any new numbness or weakness (has had weakness all over/numbness in the right leg since surgery). Pt called dr. Venetia Maxon, dr. Venetia Maxon wants eval in the ED.

## 2020-04-25 NOTE — ED Provider Notes (Signed)
[facility] EMERGENCY DEPARTMENT Provider Note   CSN: [id number] Arrival date & time: 04/25/20  2015     History Chief Complaint  Patient presents with  . Urinary Retention    Timothy Ware is a 86 y.o. male.  Patient had spine surgery for a spinal cord mass removal proximately 1 month ago.  Started having severe pain few days ago in the right CVA area more inferior and lateral than his surgical incision site.  His numbness and weakness in his lower extremities has not changed.  He denies any fevers chills.  He has been using home pain medications of oxycodone with no symptomatic relief.  Having difficulty with urinary patient denies any        Past Medical History:  Diagnosis Date  . Anemia   . Back pain with radiculopathy   . Coronary artery disease   . DJD (degenerative joint disease)   . Hypertension   . PONV (postoperative nausea and vomiting)   . Vitamin D deficiency     Patient Active Problem List   Diagnosis Date Noted  . Urinary retention   . Acute blood loss anemia   . Thrombocytopenia (HCC)   . Leucocytosis   . Orthostatic hypotension   . Neurogenic bladder   . Elevated creatine kinase   . Elevated BUN   . Elevated serum creatinine   . Bradycardia   . Steroid-induced hyperglycemia   . Labile blood pressure   . Hypoalbuminemia due to protein-calorie malnutrition (HCC)   . Essential hypertension   . Postoperative pain   . Thoracic myelopathy 02/28/2020  . HNP (herniated nucleus pulposus with myelopathy), thoracic 02/25/2020  . Thoracic disc herniation 02/25/2020    Past Surgical History:  Procedure Laterality Date  . BACK SURGERY    . CARDIAC CATHETERIZATION  10/11/1996   NL LM, insignificant LAD, 75% pRCA, 95% dRCA, 75% OM2, EF 72%. S/p RCA stents x2 & CX x1.  Marland Kitchen CARPAL TUNNEL RELEASE Bilateral   . CORONARY ANGIOPLASTY     RCA PTCA/stent 10/29/1994; RCA stents x2, Cx x1, [facility] 10/11/1996  . ELBOW SURGERY Left   . EYE SURGERY  Bilateral    cataract  . ROTATOR CUFF REPAIR Left   . THORACIC DISCECTOMY N/A 02/25/2020   Procedure: TRANSPEDICULAR MICRODISCECTOMY THORACIC TEN THORACIC ELEVEN;  Surgeon: Maeola Harman, MD;  Location: [facility] OR;  Service: Neurosurgery;  Laterality: N/A;  posterior       No family history on file.  Social History   Tobacco Use  . Smoking status: Never Smoker  . Smokeless tobacco: Never Used  Vaping Use  . Vaping Use: Never used  Substance Use Topics  . Alcohol use: Never  . Drug use: Never    Home Medications Prior to Admission medications   Medication Sig Start Date End Date Taking? Authorizing Provider  acetaminophen (TYLENOL) 325 MG tablet Take 2 tablets (650 mg total) by mouth every 4 (four) hours as needed for mild pain ((score 1 to 3) or temp > 100.5). 03/20/20   Angiulli, Mcarthur Rossetti, PA-C  atenolol (TENORMIN) 25 MG tablet Take 1 tablet (25 mg total) by mouth daily. 03/20/20   Angiulli, Mcarthur Rossetti, PA-C  atorvastatin (LIPITOR) 40 MG tablet Take 0.5 tablets (20 mg total) by mouth daily. 03/20/20   Angiulli, Mcarthur Rossetti, PA-C  B Complex-C (B-COMPLEX WITH VITAMIN C) tablet Take 1 tablet by mouth daily.    [provider]  benazepril (LOTENSIN) 5 MG tablet Take 1 tablet (5 mg  total) by mouth daily. 03/21/20   Angiulli, Mcarthur Rossetti, PA-C  bethanechol (URECHOLINE) 10 MG tablet Take 1 tablet (10 mg total) by mouth 3 (three) times daily. 03/21/20   Angiulli, Mcarthur Rossetti, PA-C  bisacodyl (DULCOLAX) 10 MG suppository Place 1 suppository (10 mg total) rectally daily as needed for moderate constipation. 03/20/20   Angiulli, Mcarthur Rossetti, PA-C  Cholecalciferol (VITAMIN D) 50 MCG (2000 UT) tablet Take 1 tablet (2,000 Units total) by mouth daily. 03/20/20   Angiulli, Mcarthur Rossetti, PA-C  dexamethasone (DECADRON) 2 MG tablet Take 1 tablet (2 mg total) by mouth daily. 03/21/20   Angiulli, Mcarthur Rossetti, PA-C  methocarbamol (ROBAXIN) 500 MG tablet Take 1 tablet (500 mg total) by mouth every 6 (six) hours as needed  for muscle spasms. 03/20/20   Angiulli, Mcarthur Rossetti, PA-C  pantoprazole (PROTONIX) 40 MG tablet Take 1 tablet (40 mg total) by mouth at bedtime. 03/20/20   Angiulli, Mcarthur Rossetti, PA-C  polyethylene glycol (MIRALAX / GLYCOLAX) 17 g packet Take 17 g by mouth daily as needed for mild constipation. 03/20/20   Angiulli, Mcarthur Rossetti, PA-C  polyethylene glycol (MIRALAX / GLYCOLAX) 17 g packet Take 17 g by mouth 2 (two) times daily. 03/21/20   Angiulli, Mcarthur Rossetti, PA-C  senna-docusate (SENOKOT-S) 8.6-50 MG tablet Take 2 tablets by mouth 2 (two) times daily. 03/20/20   Angiulli, Mcarthur Rossetti, PA-C  tamsulosin (FLOMAX) 0.4 MG CAPS capsule Take 2 capsules (0.8 mg total) by mouth daily after supper. 03/20/20   Angiulli, Mcarthur Rossetti, PA-C  traMADol (ULTRAM) 50 MG tablet Take 1 tablet (50 mg total) by mouth every 6 (six) hours as needed (For pain.). 03/20/20   Angiulli, Mcarthur Rossetti, PA-C    Allergies    Penicillins  Review of Systems   Review of Systems  Constitutional: Negative for chills and fever.  HENT: Negative for congestion and rhinorrhea.   Respiratory: Negative for cough and shortness of breath.   Cardiovascular: Negative for chest pain and palpitations.  Gastrointestinal: Positive for constipation. Negative for diarrhea, nausea and vomiting.  Genitourinary: Positive for difficulty urinating. Negative for dysuria.  Musculoskeletal: Positive for back pain. Negative for arthralgias.  Skin: Negative for color change and rash.  Neurological: Positive for weakness and numbness. Negative for light-headedness and headaches.    Physical Exam Updated Vital Signs BP (!) 120/92   Pulse 73   Temp 98.4 F (36.9 C) (Oral)   Resp 15   SpO2 97%   Physical Exam Vitals and nursing note reviewed. Exam conducted with a chaperone present.  Constitutional:      General: He is not in acute distress.    Appearance: Normal appearance.  HENT:     Head: Normocephalic and atraumatic.     Nose: No rhinorrhea.  Eyes:      General:        Right eye: No discharge.        Left eye: No discharge.     Conjunctiva/sclera: Conjunctivae normal.  Cardiovascular:     Rate and Rhythm: Normal rate and regular rhythm.  Pulmonary:     Effort: Pulmonary effort is normal.     Breath sounds: No stridor.  Abdominal:     General: Abdomen is flat. There is no distension.     Palpations: Abdomen is soft.  Musculoskeletal:        General: No deformity or signs of injury.  Skin:    General: Skin is warm and dry.  Neurological:     General: No  focal deficit present.     Mental Status: He is alert. Mental status is at baseline.     Sensory: Sensory deficit present.     Motor: Weakness present.     Comments: Weakness in flexion extension of the right lower extremity decreased sensation to light touch.  Left lower extremity sensation intact range of motion rectal tone  Psychiatric:        Mood and Affect: Mood normal.        Behavior: Behavior normal.        Thought Content: Thought content normal.     ED Results / Procedures / Treatments   Labs (all labs ordered are listed, but only abnormal results are displayed) Labs Reviewed  BASIC METABOLIC PANEL - Abnormal; Notable for the following components:      Result Value   Sodium 133 (*)    CO2 20 (*)    Glucose, Bld 121 (*)    Calcium 8.8 (*)    GFR, Estimated 59 (*)    All other components within normal limits  CBC - Abnormal; Notable for the following components:   WBC 10.8 (*)    RBC 3.69 (*)    Hemoglobin 11.2 (*)    HCT 34.4 (*)    All other components within normal limits  URINALYSIS, ROUTINE W REFLEX MICROSCOPIC    EKG None  Radiology No results found.  Procedures Procedures   Medications Ordered in ED Medications  diazepam (VALIUM) tablet 5 mg (has no administration in time range)  fentaNYL (SUBLIMAZE) injection 50 mcg (has no administration in time range)  acetaminophen (TYLENOL) tablet 1,000 mg (has no administration in time range)    ED  Course  I have reviewed the triage vital signs and the nursing notes.  Pertinent labs & imaging results that were available during my care of the patient were reviewed by me and considered in my medical decision making (see chart for details).    MDM Rules/Calculators/A&P                          Concern for postop complication from spinal surgery.  Urinary retention.  No fevers chills.  No change in neurologic status since surgery.  Pain control not working well at home.  Here urinated 300 cc and had a postvoid residual of 50.  I spoke to the neurosurgery team on call they recommended MRI of the spine with and without contrast as well as a CT scan of the lumbar spine, we will get a CT scan of the abdomen pelvis without contrast since patient is having CVA tenderness.  Urinalysis pending.  Kidney function stable.  Pt care was handed off to on coming provider at 2330.  Complete history and physical and current plan have been communicated.  Please refer to their note for the remainder of ED care and ultimate disposition.  Pt seen in conjunction with Dr. Nicanor Alcon  Final Clinical Impression(s) / ED Diagnoses Final diagnoses:  None    Rx / DC Orders ED Discharge Orders    None       Sabino Donovan, MD 04/25/20 2309

## 2020-04-26 ENCOUNTER — Other Ambulatory Visit: Payer: Self-pay | Admitting: Neurosurgery

## 2020-04-26 ENCOUNTER — Ambulatory Visit (HOSPITAL_COMMUNITY)
Admission: RE | Admit: 2020-04-26 | Discharge: 2020-04-26 | Disposition: A | Discharge status: Home / Self Care | Payer: Medicare Other | Source: Ambulatory Visit | Attending: Neurosurgery | Admitting: Neurosurgery

## 2020-04-26 ENCOUNTER — Encounter (HOSPITAL_COMMUNITY): Payer: Self-pay | Admitting: Emergency Medicine

## 2020-04-26 DIAGNOSIS — N1831 Chronic kidney disease, stage 3a: Secondary | ICD-10-CM | POA: Diagnosis present

## 2020-04-26 DIAGNOSIS — K5900 Constipation, unspecified: Secondary | ICD-10-CM | POA: Diagnosis present

## 2020-04-26 DIAGNOSIS — Z86718 Personal history of other venous thrombosis and embolism: Secondary | ICD-10-CM | POA: Diagnosis not present

## 2020-04-26 DIAGNOSIS — R5381 Other malaise: Secondary | ICD-10-CM | POA: Diagnosis present

## 2020-04-26 DIAGNOSIS — I129 Hypertensive chronic kidney disease with stage 1 through stage 4 chronic kidney disease, or unspecified chronic kidney disease: Secondary | ICD-10-CM | POA: Diagnosis present

## 2020-04-26 DIAGNOSIS — M4714 Other spondylosis with myelopathy, thoracic region: Secondary | ICD-10-CM | POA: Diagnosis not present

## 2020-04-26 DIAGNOSIS — N318 Other neuromuscular dysfunction of bladder: Secondary | ICD-10-CM | POA: Diagnosis present

## 2020-04-26 DIAGNOSIS — I2510 Atherosclerotic heart disease of native coronary artery without angina pectoris: Secondary | ICD-10-CM | POA: Diagnosis present

## 2020-04-26 DIAGNOSIS — R3915 Urgency of urination: Secondary | ICD-10-CM | POA: Diagnosis present

## 2020-04-26 DIAGNOSIS — K592 Neurogenic bowel, not elsewhere classified: Secondary | ICD-10-CM | POA: Diagnosis present

## 2020-04-26 DIAGNOSIS — N390 Urinary tract infection, site not specified: Secondary | ICD-10-CM | POA: Diagnosis present

## 2020-04-26 DIAGNOSIS — Z4789 Encounter for other orthopedic aftercare: Secondary | ICD-10-CM | POA: Diagnosis present

## 2020-04-26 DIAGNOSIS — I82412 Acute embolism and thrombosis of left femoral vein: Secondary | ICD-10-CM | POA: Diagnosis present

## 2020-04-26 DIAGNOSIS — G8220 Paraplegia, unspecified: Secondary | ICD-10-CM | POA: Diagnosis present

## 2020-04-26 DIAGNOSIS — I82432 Acute embolism and thrombosis of left popliteal vein: Secondary | ICD-10-CM | POA: Diagnosis present

## 2020-04-26 DIAGNOSIS — M4804 Spinal stenosis, thoracic region: Secondary | ICD-10-CM | POA: Insufficient documentation

## 2020-04-26 DIAGNOSIS — Z955 Presence of coronary angioplasty implant and graft: Secondary | ICD-10-CM | POA: Diagnosis not present

## 2020-04-26 DIAGNOSIS — N3000 Acute cystitis without hematuria: Secondary | ICD-10-CM | POA: Diagnosis not present

## 2020-04-26 DIAGNOSIS — Z20822 Contact with and (suspected) exposure to covid-19: Secondary | ICD-10-CM | POA: Diagnosis present

## 2020-04-26 DIAGNOSIS — G479 Sleep disorder, unspecified: Secondary | ICD-10-CM | POA: Diagnosis present

## 2020-04-26 DIAGNOSIS — N319 Neuromuscular dysfunction of bladder, unspecified: Secondary | ICD-10-CM | POA: Diagnosis present

## 2020-04-26 DIAGNOSIS — E559 Vitamin D deficiency, unspecified: Secondary | ICD-10-CM | POA: Diagnosis present

## 2020-04-26 DIAGNOSIS — Z880 Allergy status to penicillin: Secondary | ICD-10-CM | POA: Diagnosis not present

## 2020-04-26 DIAGNOSIS — M5114 Intervertebral disc disorders with radiculopathy, thoracic region: Secondary | ICD-10-CM | POA: Diagnosis present

## 2020-04-26 DIAGNOSIS — G8918 Other acute postprocedural pain: Secondary | ICD-10-CM | POA: Diagnosis present

## 2020-04-26 DIAGNOSIS — M5104 Intervertebral disc disorders with myelopathy, thoracic region: Secondary | ICD-10-CM | POA: Diagnosis present

## 2020-04-26 DIAGNOSIS — R202 Paresthesia of skin: Secondary | ICD-10-CM | POA: Diagnosis present

## 2020-04-26 DIAGNOSIS — I10 Essential (primary) hypertension: Secondary | ICD-10-CM | POA: Diagnosis present

## 2020-04-26 DIAGNOSIS — Z79899 Other long term (current) drug therapy: Secondary | ICD-10-CM | POA: Diagnosis not present

## 2020-04-26 DIAGNOSIS — Z7952 Long term (current) use of systemic steroids: Secondary | ICD-10-CM | POA: Diagnosis not present

## 2020-04-26 DIAGNOSIS — D62 Acute posthemorrhagic anemia: Secondary | ICD-10-CM | POA: Diagnosis present

## 2020-04-26 DIAGNOSIS — R339 Retention of urine, unspecified: Secondary | ICD-10-CM | POA: Diagnosis present

## 2020-04-26 DIAGNOSIS — I959 Hypotension, unspecified: Secondary | ICD-10-CM | POA: Diagnosis not present

## 2020-04-26 DIAGNOSIS — M62838 Other muscle spasm: Secondary | ICD-10-CM | POA: Diagnosis not present

## 2020-04-26 DIAGNOSIS — R29898 Other symptoms and signs involving the musculoskeletal system: Secondary | ICD-10-CM | POA: Diagnosis present

## 2020-04-26 DIAGNOSIS — E785 Hyperlipidemia, unspecified: Secondary | ICD-10-CM | POA: Diagnosis present

## 2020-04-26 DIAGNOSIS — B958 Unspecified staphylococcus as the cause of diseases classified elsewhere: Secondary | ICD-10-CM | POA: Diagnosis present

## 2020-04-26 LAB — URINALYSIS, MICROSCOPIC (REFLEX)

## 2020-04-26 LAB — URINALYSIS, ROUTINE W REFLEX MICROSCOPIC
Bilirubin Urine: NEGATIVE
Glucose, UA: NEGATIVE mg/dL
Ketones, ur: NEGATIVE mg/dL
Leukocytes,Ua: NEGATIVE
Nitrite: POSITIVE — AB
Protein, ur: NEGATIVE mg/dL
Specific Gravity, Urine: 1.015 (ref 1.005–1.030)
pH: 7 (ref 5.0–8.0)

## 2020-04-26 LAB — SARS CORONAVIRUS 2 (TAT 6-24 HRS): SARS Coronavirus 2: NEGATIVE

## 2020-04-26 MED ORDER — ACETAMINOPHEN 325 MG PO TABS
650.0000 mg | ORAL_TABLET | Freq: Four times a day (QID) | ORAL | Status: DC | PRN
  Administered 2020-04-28 – 2020-05-02 (×7): 650 mg via ORAL
  Filled 2020-04-26 (×7): qty 2

## 2020-04-26 MED ORDER — ENOXAPARIN SODIUM 40 MG/0.4ML ~~LOC~~ SOLN
40.0000 mg | SUBCUTANEOUS | Status: DC
  Administered 2020-04-27 – 2020-05-05 (×7): 40 mg via SUBCUTANEOUS
  Filled 2020-04-26 (×7): qty 0.4

## 2020-04-26 MED ORDER — B COMPLEX-C PO TABS
1.0000 | ORAL_TABLET | Freq: Every day | ORAL | Status: DC
  Administered 2020-04-27 – 2020-05-06 (×9): 1 via ORAL
  Filled 2020-04-26 (×10): qty 1

## 2020-04-26 MED ORDER — SODIUM CHLORIDE 0.9 % IV SOLN
1.0000 g | INTRAVENOUS | Status: DC
  Administered 2020-04-27 – 2020-04-28 (×2): 1 g via INTRAVENOUS
  Filled 2020-04-26 (×2): qty 1
  Filled 2020-04-26: qty 10

## 2020-04-26 MED ORDER — SULFAMETHOXAZOLE-TRIMETHOPRIM 800-160 MG PO TABS
1.0000 | ORAL_TABLET | Freq: Once | ORAL | Status: AC
  Administered 2020-04-26: 1 via ORAL
  Filled 2020-04-26: qty 1

## 2020-04-26 MED ORDER — ATORVASTATIN CALCIUM 10 MG PO TABS
20.0000 mg | ORAL_TABLET | Freq: Every day | ORAL | Status: DC
  Administered 2020-04-27 – 2020-05-06 (×9): 20 mg via ORAL
  Filled 2020-04-26 (×9): qty 2

## 2020-04-26 MED ORDER — METHOCARBAMOL 500 MG PO TABS: 500.0000 mg | ORAL_TABLET | Freq: Four times a day (QID) | ORAL | Status: DC | PRN

## 2020-04-26 MED ORDER — DIAZEPAM 5 MG PO TABS
5.0000 mg | ORAL_TABLET | Freq: Once | ORAL | Status: AC
  Administered 2020-04-26: 5 mg via ORAL
  Filled 2020-04-26: qty 1

## 2020-04-26 MED ORDER — VITAMIN D 25 MCG (1000 UNIT) PO TABS
2000.0000 [IU] | ORAL_TABLET | Freq: Every day | ORAL | Status: DC
  Administered 2020-04-27 – 2020-05-06 (×9): 2000 [IU] via ORAL
  Filled 2020-04-26 (×9): qty 2

## 2020-04-26 MED ORDER — BACLOFEN 10 MG PO TABS
10.0000 mg | ORAL_TABLET | Freq: Three times a day (TID) | ORAL | Status: DC | PRN
  Administered 2020-04-26 – 2020-05-05 (×14): 10 mg via ORAL
  Filled 2020-04-26 (×15): qty 1

## 2020-04-26 MED ORDER — POLYETHYLENE GLYCOL 3350 17 G PO PACK: 17.0000 g | PACK | Freq: Every day | ORAL | Status: DC | PRN

## 2020-04-26 MED ORDER — BETHANECHOL CHLORIDE 10 MG PO TABS: 10.0000 mg | ORAL_TABLET | Freq: Three times a day (TID) | ORAL | Status: DC

## 2020-04-26 MED ORDER — GADOBUTROL 1 MMOL/ML IV SOLN
9.0000 mL | Freq: Once | INTRAVENOUS | Status: AC | PRN
  Administered 2020-04-26: 9 mL via INTRAVENOUS

## 2020-04-26 MED ORDER — VANCOMYCIN HCL IN DEXTROSE 1-5 GM/200ML-% IV SOLN
1000.0000 mg | Freq: Once | INTRAVENOUS | Status: AC
  Administered 2020-04-26: 1000 mg via INTRAVENOUS
  Filled 2020-04-26: qty 200

## 2020-04-26 MED ORDER — SENNOSIDES-DOCUSATE SODIUM 8.6-50 MG PO TABS
2.0000 | ORAL_TABLET | Freq: Every day | ORAL | Status: DC
  Administered 2020-04-27 – 2020-04-28 (×2): 2 via ORAL
  Administered 2020-04-29: 1 via ORAL
  Administered 2020-04-30 – 2020-05-01 (×2): 2 via ORAL
  Filled 2020-04-26 (×6): qty 2

## 2020-04-26 MED ORDER — SODIUM CHLORIDE 0.9 % IV SOLN
2.0000 g | INTRAVENOUS | Status: AC
  Administered 2020-04-26: 2 g via INTRAVENOUS
  Filled 2020-04-26: qty 2

## 2020-04-26 MED ORDER — SULFAMETHOXAZOLE-TRIMETHOPRIM 800-160 MG PO TABS: 1.0000 | ORAL_TABLET | Freq: Two times a day (BID) | ORAL | 0 refills | Status: AC

## 2020-04-26 MED ORDER — ACETAMINOPHEN 325 MG PO TABS: 650.0000 mg | ORAL_TABLET | ORAL | Status: DC | PRN

## 2020-04-26 MED ORDER — ATENOLOL 25 MG PO TABS
25.0000 mg | ORAL_TABLET | Freq: Every day | ORAL | Status: DC
  Administered 2020-04-27 – 2020-05-06 (×8): 25 mg via ORAL
  Filled 2020-04-26 (×8): qty 1

## 2020-04-26 MED ORDER — HYDROCODONE-ACETAMINOPHEN 5-325 MG PO TABS
1.0000 | ORAL_TABLET | ORAL | Status: DC | PRN
  Administered 2020-04-26 – 2020-05-01 (×12): 1 via ORAL
  Filled 2020-04-26 (×13): qty 1

## 2020-04-26 MED ORDER — TAMSULOSIN HCL 0.4 MG PO CAPS: 0.8000 mg | ORAL_CAPSULE | Freq: Every day | ORAL | Status: DC

## 2020-04-26 NOTE — Consult Note (Signed)
Reason for Consult: Post op urinary retention and low back pain Referring Physician: Sabino Donovan, MD  HPI: Timothy Ware is an 86 y.o. male who is s/p transpedicular microdiskectomy T10-11 on 02/25/2020. A few days ago, he began experiencing severe low back pain centered in the right CVA area more inferior and lateral than his surgical site. He was treating his symptoms at home with Percocet 5/325 mg until yesterday when his pain became unbearable. The patient then presented to the ED for evaluation. His postoperative exam revealed distal lower extremity strength is full on the right but the patient has marked proximal right leg weakness and also has more numbness in his right leg although he has intact hot cold sensation in his right leg. He also reports difficulty with urination.  Past Medical History:  Diagnosis Date  . Anemia   . Back pain with radiculopathy   . Coronary artery disease   . DJD (degenerative joint disease)   . Hypertension   . PONV (postoperative nausea and vomiting)   . Vitamin D deficiency     Past Surgical History:  Procedure Laterality Date  . BACK SURGERY    . CARDIAC CATHETERIZATION  10/11/1996   NL LM, insignificant LAD, 75% pRCA, 95% dRCA, 75% OM2, EF 72%. S/p RCA stents x2 & CX x1.  Marland Kitchen CARPAL TUNNEL RELEASE Bilateral   . CORONARY ANGIOPLASTY     RCA PTCA/stent 10/29/1994; RCA stents x2, Cx x1, [facility] 10/11/1996  . ELBOW SURGERY Left   . EYE SURGERY Bilateral    cataract  . ROTATOR CUFF REPAIR Left   . THORACIC DISCECTOMY N/A 02/25/2020   Procedure: TRANSPEDICULAR MICRODISCECTOMY THORACIC TEN THORACIC ELEVEN;  Surgeon: Maeola Harman, MD;  Location[facility];  Service: Neurosurgery;  Laterality: N/A;  posterior    History reviewed. No pertinent family history.  Social History:  reports that he has never smoked. He has never used smokeless tobacco. He reports that he does not drink alcohol and does not use drugs.  Allergies:  Allergies  Allergen Reactions  .  Penicillins Hives    Medications: I have reviewed the patient's current medications.  Results for orders placed or performed during the hospital encounter of 04/25/20 (from the past 48 hour(s))  Basic metabolic panel     Status: Abnormal   Collection Time: 04/25/20  8:33 PM  Result Value Ref Range   Sodium 133 (L) 135 - 145 mmol/L   Potassium 3.9 3.5 - 5.1 mmol/L   Chloride 100 98 - 111 mmol/L   CO2 20 (L) 22 - 32 mmol/L   Glucose, Bld 121 (H) 70 - 99 mg/dL    Comment: Glucose reference range applies only to samples taken after fasting for at least 8 hours.   BUN 18 8 - 23 mg/dL   Creatinine, Ser 4.48 0.61 - 1.24 mg/dL   Calcium 8.8 (L) 8.9 - 10.3 mg/dL   GFR, Estimated 59 (L) >60 mL/min    Comment: (NOTE) Calculated using the CKD-EPI Creatinine Equation (2021)    Anion gap 13 5 - 15    Comment: Performed at [facility], [address] [place], [place] [address]  CBC     Status: Abnormal   Collection Time: 04/25/20  8:33 PM  Result Value Ref Range   WBC 10.8 (H) 4.0 - 10.5 K/uL   RBC 3.69 (L) 4.22 - 5.81 MIL/uL   Hemoglobin 11.2 (L) 13.0 - 17.0 g/dL   HCT 14.9 (L) 70.2 - 63.7 %  MCV 93.2 80.0 - 100.0 fL   MCH 30.4 26.0 - 34.0 pg   MCHC 32.6 30.0 - 36.0 g/dL   RDW 96.013.2 45.411.5 - 09.815.5 %   Platelets 280 150 - 400 K/uL   nRBC 0.0 0.0 - 0.2 %    Comment: Performed at [facility], [address] [place], [place] [address]  Urinalysis, Routine w reflex microscopic Urine, Clean Catch     Status: Abnormal   Collection Time: 04/25/20 11:02 PM  Result Value Ref Range   Color, Urine YELLOW YELLOW   APPearance CLOUDY (A) CLEAR   Specific Gravity, Urine 1.015 1.005 - 1.030   pH 7.0 5.0 - 8.0   Glucose, UA NEGATIVE NEGATIVE mg/dL   Hgb urine dipstick SMALL (A) NEGATIVE   Bilirubin Urine NEGATIVE NEGATIVE   Ketones, ur NEGATIVE NEGATIVE mg/dL   Protein, ur NEGATIVE NEGATIVE mg/dL   Nitrite POSITIVE (A) NEGATIVE   Leukocytes,Ua NEGATIVE NEGATIVE    Comment: Performed  at [facility], [address] [place], [place] [address]  Urinalysis, Microscopic (reflex)     Status: Abnormal   Collection Time: 04/25/20 11:02 PM  Result Value Ref Range   RBC / HPF 0-5 0 - 5 RBC/hpf   WBC, UA 0-5 0 - 5 WBC/hpf   Bacteria, UA MANY (A) NONE SEEN   Squamous Epithelial / LPF 0-5 0 - 5    Comment: Performed at [facility], [address] [place], [place] [address]    MR THORACIC SPINE W WO CONTRAST  Result Date: 04/26/2020 CLINICAL DATA:  Initial evaluation for severe back pain over right CVA region. History of prior surgery approximately 1 month ago. EXAM: MRI THORACIC AND LUMBAR SPINE WITHOUT AND WITH CONTRAST TECHNIQUE: Multiplanar and multiecho pulse sequences of the thoracic and lumbar spine were obtained without and with intravenous contrast. CONTRAST:  9mL GADAVIST GADOBUTROL 1 MMOL/ML IV SOLN COMPARISON:  There is comparison made with previous MRI from 12/30/2019. FINDINGS: MRI THORACIC SPINE FINDINGS Alignment: Mild dextroscoliosis. Trace anterolisthesis of T2 on T3 and T3 on T4, relatively similar in appearance to previous. Vertebrae: Postoperative changes from interval left-sided hemilaminectomy with probable facetectomy and micro discectomy at T10-11 are seen. The left pedicle of T11 has also been at least partially resected. No significant fluid collection at the laminectomy site or along the posterior surgical incision. Extensive soft tissue signal intensity with diffuse postcontrast enhancement seen nearly encompassing the entirety of the thecal sac, greater on the left, and consistent with postoperative granulation tissue/fibrosis (series 37, image 32). Persistent fairly severe spinal stenosis at this level due to residual spondylosis, right-sided facet disease, and postoperative granulation tissue. Thecal sac measures approximately 3-4 mm in AP diameter at its most narrow point (series 29, image 32). Thecal sac is compressed and deviated to the right at  this point as well. Increased T2/STIR signal intensity within the distal cord at this level consistent with chronic myelomalacia (series 29, image 31). Additionally, note is made of increased fluid signal intensity within the T11-12 interspace, with associated edema and enhancement within the adjacent T10 and T11 endplates, greater on the left. While these findings are favored to be postoperative and/or degenerative in nature, possible infection is difficult to exclude, and could be considered in the correct clinical setting. No discrete epidural or other collection. Cord: Cord signal abnormality at the level of T10-11, consistent with chronic myelomalacia. Signal intensity within the cord is otherwise within normal limits. Paraspinal and other soft  tissues: Postoperative changes from prior left-sided posterior decompression and micro discectomy at T10-11 as above. No soft tissue collections. Paraspinous soft tissues otherwise within normal limits. Partially visualized lungs are largely clear. Visualized visceral structures unremarkable. Disc levels: C7-T1: Central/left paracentral disc protrusion indents the ventral thecal sac. Bilateral facet and ligament flavum hypertrophy. Mild spinal stenosis. Moderate to severe bilateral foraminal narrowing. T1-2: Moderate-sized right paracentral disc protrusion indents the ventral thecal sac (series 29, image 5). Bilateral facet hypertrophy. Resultant moderate to severe spinal stenosis with flattening of the right hemi cord, but no definite cord signal changes. Moderate to severe right with moderate left foraminal narrowing. T2-3: Trace anterolisthesis. Mild disc bulge with right greater than left facet hypertrophy. Mild spinal stenosis. Moderate right with mild left foraminal narrowing. T3-4: Trace anterolisthesis. Mild disc bulge with superimposed small central disc protrusion. Bilateral facet hypertrophy. No significant stenosis. T4-5:  Negative interspace.  Mild facet  hypertrophy.  No stenosis. T5-6:  Negative interspace.  Mild facet hypertrophy.  No stenosis. T6-7: Central disc protrusion indents the ventral thecal sac. No significant spinal stenosis or cord deformity. Foramina remain patent. T7-8: Central disc protrusion indents the ventral thecal sac. No significant spinal stenosis or cord impingement. Foramina remain patent. T8-9: Moderate-sized left paracentral disc protrusion with slight superior angulation (series 29, image 25). Mild flattening of the left hemi cord without cord signal changes or significant spinal stenosis. Foramina remain patent. T9-10: Negative interspace. Bilateral facet hypertrophy. No significant spinal stenosis. Mild left greater than right foraminal narrowing. T10-11: Degenerative intervertebral disc space narrowing with reactive endplate change. Postoperative changes from recent left hemilaminectomy, facetectomy, and micro discectomy. Extensive postoperative fibrosis with residual severe spinal stenosis as above. Moderate to severe left with moderate right foraminal narrowing. T11-12: Diffuse disc bulge. Superimposed left subarticular to foraminal disc protrusion (series 29, image 34). Reactive endplate spurring with left greater than right facet hypertrophy. Resultant moderate to severe left lateral recess stenosis with flattening of the left hemi cord. Moderate left foraminal narrowing. T12-L1: Negative interspace. Bilateral facet hypertrophy. No stenosis. Other than the surgical level, overall appearance of the thoracic spine is not significantly changed. MRI LUMBAR SPINE FINDINGS Segmentation: Standard. Lowest well-formed disc space labeled the L5-S1 level. Alignment: Levoscoliosis. 3 mm retrolisthesis of L1 on L2, with trace retrolisthesis of L3 on L4, stable. Vertebrae: Susceptibility artifact from prior PLIF at L2-3 and L3-4. Vertebral body height maintained without acute or interval fracture. Bone marrow signal intensity within normal  limits. No worrisome osseous lesions. No abnormal marrow edema or enhancement. Conus medullaris: Extends to the L1 level and appears normal. Conus medullaris and nerve roots of the cauda equina are within normal limits. Paraspinal and other soft tissues: Chronic postoperative changes present throughout the posterior paraspinous soft tissues. Few scattered benign appearing cyst noted about the kidneys bilaterally. Visualized visceral structures otherwise unremarkable. Disc levels: L1-2: Retrolisthesis. Advanced degenerative intervertebral disc space narrowing with diffuse disc bulge and disc desiccation. Extensive reactive endplate change with marginal endplate osteophytic spurring, worse on the right. Resultant diffuse disc osteophyte complex, asymmetric to the right. A superimposed right subarticular disc extrusion has partially regressed in the interim (series 1, image 10). Severe right worse than left facet degeneration. Central canal widely patent. Persistent moderate right lateral recess stenosis. Severe right with moderate left L1 neural foraminal narrowing, stable. L2-3: Prior PLIF. Thecal sac widely patent. No foraminal stenosis. L3-4: Prior PLIF. Residual right subarticular endplate osseous ridging indents the right ventral thecal sac (series 4, image 15). Moderate right  worse than left facet hypertrophy. Residual moderate right lateral recess stenosis. Central canal remains patent. Foramina appear patent bilaterally. Appearance is stable. L4-5: Diffuse disc bulge with disc desiccation and intervertebral disc space narrowing. Superimposed right foraminal disc extrusion with superior migration (series 3, image 13), grossly stable from previous. Prior posterior decompression. Residual moderate left worse than right facet hypertrophy. Central canal widely patent. Residual mild to moderate left greater than right lateral recess stenosis. Mild to moderate left greater than right L4 foraminal narrowing.  Appearance is stable. L5-S1: Diffuse disc bulge with disc desiccation and intervertebral disc space narrowing. Reactive endplate spurring. Moderate left worse than right facet degeneration. Central canal remains patent. Severe bilateral L5 foraminal narrowing. Appearance is stable. IMPRESSION: 1. Postoperative changes from interval left-sided posterior decompression and micro discectomy at T10-11. Extensive postoperative granulation tissue/fibrosis encompasses the thecal sac at this level, with persistent severe spinal stenosis. Associated cord signal abnormality consistent with chronic myelomalacia. 2. Small amount of fluid signal intensity within the T11-12 interspace, with associated edema and enhancement within the adjacent T10 and T11 endplates. While these findings are favored to be postoperative and/or degenerative in nature, possible superimposed infection is difficult to exclude, and could be considered in the correct clinical setting. Correlation with symptomatology and laboratory values recommended. 3. Otherwise stable appearance of the thoracic and lumbar spine with advanced multilevel degenerative spondylosis and facet degeneration as above. Please see above report for a full description of these findings. 4. Stable postoperative changes from prior PLIF at L2-3 and L3-4. Electronically Signed   By: Rise Mu M.D.   On: 04/26/2020 04:53   MR Lumbar Spine W Wo Contrast  Result Date: 04/26/2020 CLINICAL DATA:  Initial evaluation for severe back pain over right CVA region. History of prior surgery approximately 1 month ago. EXAM: MRI THORACIC AND LUMBAR SPINE WITHOUT AND WITH CONTRAST TECHNIQUE: Multiplanar and multiecho pulse sequences of the thoracic and lumbar spine were obtained without and with intravenous contrast. CONTRAST:  9mL GADAVIST GADOBUTROL 1 MMOL/ML IV SOLN COMPARISON:  There is comparison made with previous MRI from 12/30/2019. FINDINGS: MRI THORACIC SPINE FINDINGS Alignment:  Mild dextroscoliosis. Trace anterolisthesis of T2 on T3 and T3 on T4, relatively similar in appearance to previous. Vertebrae: Postoperative changes from interval left-sided hemilaminectomy with probable facetectomy and micro discectomy at T10-11 are seen. The left pedicle of T11 has also been at least partially resected. No significant fluid collection at the laminectomy site or along the posterior surgical incision. Extensive soft tissue signal intensity with diffuse postcontrast enhancement seen nearly encompassing the entirety of the thecal sac, greater on the left, and consistent with postoperative granulation tissue/fibrosis (series 37, image 32). Persistent fairly severe spinal stenosis at this level due to residual spondylosis, right-sided facet disease, and postoperative granulation tissue. Thecal sac measures approximately 3-4 mm in AP diameter at its most narrow point (series 29, image 32). Thecal sac is compressed and deviated to the right at this point as well. Increased T2/STIR signal intensity within the distal cord at this level consistent with chronic myelomalacia (series 29, image 31). Additionally, note is made of increased fluid signal intensity within the T11-12 interspace, with associated edema and enhancement within the adjacent T10 and T11 endplates, greater on the left. While these findings are favored to be postoperative and/or degenerative in nature, possible infection is difficult to exclude, and could be considered in the correct clinical setting. No discrete epidural or other collection. Cord: Cord signal abnormality at the level of T10-11, consistent  with chronic myelomalacia. Signal intensity within the cord is otherwise within normal limits. Paraspinal and other soft tissues: Postoperative changes from prior left-sided posterior decompression and micro discectomy at T10-11 as above. No soft tissue collections. Paraspinous soft tissues otherwise within normal limits. Partially  visualized lungs are largely clear. Visualized visceral structures unremarkable. Disc levels: C7-T1: Central/left paracentral disc protrusion indents the ventral thecal sac. Bilateral facet and ligament flavum hypertrophy. Mild spinal stenosis. Moderate to severe bilateral foraminal narrowing. T1-2: Moderate-sized right paracentral disc protrusion indents the ventral thecal sac (series 29, image 5). Bilateral facet hypertrophy. Resultant moderate to severe spinal stenosis with flattening of the right hemi cord, but no definite cord signal changes. Moderate to severe right with moderate left foraminal narrowing. T2-3: Trace anterolisthesis. Mild disc bulge with right greater than left facet hypertrophy. Mild spinal stenosis. Moderate right with mild left foraminal narrowing. T3-4: Trace anterolisthesis. Mild disc bulge with superimposed small central disc protrusion. Bilateral facet hypertrophy. No significant stenosis. T4-5:  Negative interspace.  Mild facet hypertrophy.  No stenosis. T5-6:  Negative interspace.  Mild facet hypertrophy.  No stenosis. T6-7: Central disc protrusion indents the ventral thecal sac. No significant spinal stenosis or cord deformity. Foramina remain patent. T7-8: Central disc protrusion indents the ventral thecal sac. No significant spinal stenosis or cord impingement. Foramina remain patent. T8-9: Moderate-sized left paracentral disc protrusion with slight superior angulation (series 29, image 25). Mild flattening of the left hemi cord without cord signal changes or significant spinal stenosis. Foramina remain patent. T9-10: Negative interspace. Bilateral facet hypertrophy. No significant spinal stenosis. Mild left greater than right foraminal narrowing. T10-11: Degenerative intervertebral disc space narrowing with reactive endplate change. Postoperative changes from recent left hemilaminectomy, facetectomy, and micro discectomy. Extensive postoperative fibrosis with residual severe  spinal stenosis as above. Moderate to severe left with moderate right foraminal narrowing. T11-12: Diffuse disc bulge. Superimposed left subarticular to foraminal disc protrusion (series 29, image 34). Reactive endplate spurring with left greater than right facet hypertrophy. Resultant moderate to severe left lateral recess stenosis with flattening of the left hemi cord. Moderate left foraminal narrowing. T12-L1: Negative interspace. Bilateral facet hypertrophy. No stenosis. Other than the surgical level, overall appearance of the thoracic spine is not significantly changed. MRI LUMBAR SPINE FINDINGS Segmentation: Standard. Lowest well-formed disc space labeled the L5-S1 level. Alignment: Levoscoliosis. 3 mm retrolisthesis of L1 on L2, with trace retrolisthesis of L3 on L4, stable. Vertebrae: Susceptibility artifact from prior PLIF at L2-3 and L3-4. Vertebral body height maintained without acute or interval fracture. Bone marrow signal intensity within normal limits. No worrisome osseous lesions. No abnormal marrow edema or enhancement. Conus medullaris: Extends to the L1 level and appears normal. Conus medullaris and nerve roots of the cauda equina are within normal limits. Paraspinal and other soft tissues: Chronic postoperative changes present throughout the posterior paraspinous soft tissues. Few scattered benign appearing cyst noted about the kidneys bilaterally. Visualized visceral structures otherwise unremarkable. Disc levels: L1-2: Retrolisthesis. Advanced degenerative intervertebral disc space narrowing with diffuse disc bulge and disc desiccation. Extensive reactive endplate change with marginal endplate osteophytic spurring, worse on the right. Resultant diffuse disc osteophyte complex, asymmetric to the right. A superimposed right subarticular disc extrusion has partially regressed in the interim (series 1, image 10). Severe right worse than left facet degeneration. Central canal widely patent.  Persistent moderate right lateral recess stenosis. Severe right with moderate left L1 neural foraminal narrowing, stable. L2-3: Prior PLIF. Thecal sac widely patent. No foraminal stenosis. L3-4: Prior PLIF.  Residual right subarticular endplate osseous ridging indents the right ventral thecal sac (series 4, image 15). Moderate right worse than left facet hypertrophy. Residual moderate right lateral recess stenosis. Central canal remains patent. Foramina appear patent bilaterally. Appearance is stable. L4-5: Diffuse disc bulge with disc desiccation and intervertebral disc space narrowing. Superimposed right foraminal disc extrusion with superior migration (series 3, image 13), grossly stable from previous. Prior posterior decompression. Residual moderate left worse than right facet hypertrophy. Central canal widely patent. Residual mild to moderate left greater than right lateral recess stenosis. Mild to moderate left greater than right L4 foraminal narrowing. Appearance is stable. L5-S1: Diffuse disc bulge with disc desiccation and intervertebral disc space narrowing. Reactive endplate spurring. Moderate left worse than right facet degeneration. Central canal remains patent. Severe bilateral L5 foraminal narrowing. Appearance is stable. IMPRESSION: 1. Postoperative changes from interval left-sided posterior decompression and micro discectomy at T10-11. Extensive postoperative granulation tissue/fibrosis encompasses the thecal sac at this level, with persistent severe spinal stenosis. Associated cord signal abnormality consistent with chronic myelomalacia. 2. Small amount of fluid signal intensity within the T11-12 interspace, with associated edema and enhancement within the adjacent T10 and T11 endplates. While these findings are favored to be postoperative and/or degenerative in nature, possible superimposed infection is difficult to exclude, and could be considered in the correct clinical setting. Correlation with  symptomatology and laboratory values recommended. 3. Otherwise stable appearance of the thoracic and lumbar spine with advanced multilevel degenerative spondylosis and facet degeneration as above. Please see above report for a full description of these findings. 4. Stable postoperative changes from prior PLIF at L2-3 and L3-4. Electronically Signed   By: Rise Mu M.D.   On: 04/26/2020 04:53   CT Renal Stone Study  Result Date: 04/25/2020 CLINICAL DATA:  Right-sided flank pain EXAM: CT ABDOMEN AND PELVIS WITHOUT CONTRAST TECHNIQUE: Multidetector CT imaging of the abdomen and pelvis was performed following the standard protocol without IV contrast. COMPARISON:  03/18/2020 FINDINGS: Lower chest: Some peripheral areas of increased parenchymal density are noted likely related to aspirated material this is likely chronic in nature. No effusion is seen. Hepatobiliary: No focal liver abnormality is seen. No gallstones, gallbladder wall thickening, or biliary dilatation. Pancreas: Unremarkable. No pancreatic ductal dilatation or surrounding inflammatory changes. Spleen: Normal in size without focal abnormality. Adrenals/Urinary Tract: Adrenal glands are within normal limits. Kidneys demonstrate no renal calculi or obstructive changes. No mass lesions are seen. Ureters are within normal limits. Bladder is partially distended. Stomach/Bowel: The appendix is within normal limits. Colon demonstrates no obstructive or inflammatory changes. Small bowel and stomach appear within normal limits. Vascular/Lymphatic: Aortic atherosclerosis. No enlarged abdominal or pelvic lymph nodes. Reproductive: Prostate is unremarkable. Other: No abdominal wall hernia or abnormality. No abdominopelvic ascites. Musculoskeletal: Degenerative and postoperative changes in the lumbar spine are noted. No acute bony abnormality is seen. IMPRESSION: No renal calculi or obstructive changes. Normal-appearing appendix. Dense material within the  periphery of the lungs bilaterally likely related to prior aspiration. Electronically Signed   By: Alcide Clever M.D.   On: 04/25/2020 23:34    Review of Systems  Constitutional: Negative for chills and fever.  HENT: Negative.   Respiratory: Negative for shortness of breath and wheezing.   Cardiovascular: Negative for chest pain and palpitations.  Gastrointestinal: Positive for constipation. Negative for abdominal distention, abdominal pain, diarrhea, nausea and vomiting.  Genitourinary: Positive for difficulty urinating. Negative for dysuria.  Musculoskeletal: Positive for back pain. Negative for arthralgias and neck pain.  Neurological: Positive for weakness and numbness. Negative for light-headedness and headaches.  Psychiatric/Behavioral: Negative for agitation, behavioral problems, confusion and decreased concentration. The patient is nervous/anxious.    Blood pressure (!) 124/56, pulse 66, temperature 97.7 F (36.5 C), temperature source Oral, resp. rate 18, height 6\' 1"  (1.854 m), weight 88.5 kg, SpO2 95 %. Physical Exam Constitutional:      Appearance: Normal appearance. He is normal weight.  HENT:     Head: Normocephalic and atraumatic.  Eyes:     Extraocular Movements: Extraocular movements intact.     Pupils: Pupils are equal, round, and reactive to light.  Cardiovascular:     Rate and Rhythm: Normal rate and regular rhythm.     Pulses: Normal pulses.     Heart sounds: Normal heart sounds.  Pulmonary:     Effort: Pulmonary effort is normal.     Breath sounds: Normal breath sounds.  Abdominal:     General: Abdomen is flat. Bowel sounds are normal.     Palpations: Abdomen is soft.  Musculoskeletal:        General: Normal range of motion.     Cervical back: Normal range of motion and neck supple.  Skin:    General: Skin is warm and dry.  Neurological:     General: No focal deficit present.     Mental Status: He is alert and oriented to person, place, and time. Mental  status is at baseline.     Cranial Nerves: No cranial nerve deficit.     Sensory: Sensory deficit present.     Motor: Weakness present.  Psychiatric:        Mood and Affect: Mood normal.        Thought Content: Thought content normal.        Judgment: Judgment normal.     Assessment/Plan: 86 y.o. male who is s/p transpedicular microdiskectomy T10-11 on 02/25/2020, admitted this morning due to intractable low back pain and UTI. Patient appears to be in much less pain this morning and is doing well. Surgical site has a significant amount of inflammation and potentially reruptured disc. I do not feel as though his surgical site is infected.   Admit to medicine service for pain control and UTI treatment. Plan for right-sided transpedicular decompression at T10 and T11 with fixation at T10 through T12 on 05/02/2020.  06/30/2020, DNP, NP-C 04/26/2020, 9:02 AM

## 2020-04-26 NOTE — ED Provider Notes (Signed)
Dr. Venetia Maxon would like patient admitted to the medical service for treatment of UTI.  He is still deciding whether or not the patient needs to go back to the OR.  Dr. Cleaster Corin will admit.   Pricilla Loveless, MD 04/26/20 [phone]

## 2020-04-26 NOTE — H&P (Signed)
Date: 04/26/2020               Patient Name:  Timothy Ware MRN: [id number]  DOB: 30-Aug-1934 Age / Sex: 86 y.o., male   PCP: Elise Benne, MD         Medical Service: Internal Medicine Teaching Service         Attending Physician: Dr. Anne Shutter, MD    First Contact: Dr. Elaina Pattee Pager: [phone]  Second Contact: Dr. Mcarthur Rossetti Pager: [phone]       After Hours (After 5p/  First Contact Pager: [phone]  weekends / holidays): Second Contact Pager: [phone]   Chief Complaint: CVA tenderness  History of Present Illness:   Timothy Ware is an 86yo male with PMH of HTN, degenerative joint disease, CKD stage IIIa, back pain with radiculopathy s/p transpedicular microdiscectomy T10/11 in early December presenting with low back pain and difficulty with urination. He states since his surgery in December he has had RLE weakness and numbness. Prior to surgery it was his LLE that was very weak and numb. He also has not been able to feel when he urinates or defecates since surgery. He did have a rectal exam since then and was told his sphincter strength was intact, he was able to feel the rectal exam.  He initially had decreased back pain after surgery but shortly after going home began to have right lower back pain that is worse with movement. Also in the last few days he began to have severe confusion, he cannot remember a whole lot, but his wife and son told him he kept asking to use the restroom when he was already using it.  His confusion resolved yesterday on the way to the hospital. He thinks the confusion may have been due to the pain medication he was taking. During this time he also developed increased urge to urinate that is not relieved once he does use the bathroom. He has been able to urinate most of the time but sometimes nothing comes out. He has no dysuria but also cannot feel when he urinates. He has not had a bowel movement in two days.  He denies nausea, abdominal pain, fever,  chills, dizziness, shortness of breath or falls.   ED Course: Patient was able to urinate and PVR was 50cc. CT renal was done which did not show retention or nephrolithiasis or renal injury. MRI was done, which showed postoperative granulation tissue and severe spinal stenosis t10-11 with edema, unlikely to be infection. Neurosurgery was consulted and also thought the MRI was unlikely to be significant for infection. UA showed small hemoglobin, nitrites, many bacteria, no leukocytes. WBC was 10.8.   Social:   He lives in [place] with his wife  He does not use tobacco or drink alcohol   Family History:   History reviewed. No pertinent family history.  Meds:  Current Meds  Medication Sig  . sulfamethoxazole-trimethoprim (BACTRIM DS) 800-160 MG tablet Take 1 tablet by mouth 2 (two) times daily for 7 days.     Allergies: Allergies as of 04/25/2020 - Review Complete 02/28/2020  Allergen Reaction Noted  . Penicillins Hives 02/15/2020   Past Medical History:  Diagnosis Date  . Anemia   . Back pain with radiculopathy   . Coronary artery disease   . DJD (degenerative joint disease)   . Hypertension   . PONV (postoperative nausea and vomiting)   . Vitamin D deficiency      Review of Systems:  A complete ROS was negative except as per HPI.   Physical Exam: Blood pressure (!) 135/56, pulse 66, temperature 97.7 F (36.5 C), temperature source Oral, resp. rate 15, height 6\' 1"  (1.854 m), weight 88.5 kg, SpO2 96 %.  Constitution: NAD, supine in bed  HENT: Timothy Ware  Eyes: no icterus or injection, EOM intact  Cardio: RRR, no m/r/g, no LE edema Respiratory: CTA, no wheezing rales or rhonchi  Abdominal: hypoactive BS, NTTP, soft, non-distended; no CVA tenderness  MSK:  LLE: sensation intact, hip flexion/extension, plantar flexion and dorsiflexion 5/5; L4 reflex diminished but s/p knee surgery, S1 2+ RLE: sensation intact along lateral leg, anterior ankle, otherwise no sensation  except to temperature, hip flexion/extension strength 2/5; dorsiflexion and plantar flexion 5/5; reflexes 2+  GU: urinating on his own, urine light yellow  Neuro: alert & oriented x3, CN II-XII grossly intact, normal affect  Skin: c/d/i    CT Renal Stone 04/25/2020 IMPRESSION: No renal calculi or obstructive changes. Normal-appearing appendix. Dense material within the periphery of the lungs bilaterally likely related to prior aspiration.  Electronically Signed   By: 06/23/2020 M.D.   On: 04/25/2020 23:34  MRI 04/26/2020  IMPRESSION: 1. Postoperative changes from interval left-sided posterior decompression and micro discectomy at T10-11. Extensive postoperative granulation tissue/fibrosis encompasses the thecal sac at this level, with persistent severe spinal stenosis. Associated cord signal abnormality consistent with chronic myelomalacia. 2. Small amount of fluid signal intensity within the T11-12 interspace, with associated edema and enhancement within the adjacent T10 and T11 endplates. While these findings are favored to be postoperative and/or degenerative in nature, possible superimposed infection is difficult to exclude, and could be considered in the correct clinical setting. Correlation with symptomatology and laboratory values recommended. 3. Otherwise stable appearance of the thoracic and lumbar spine with advanced multilevel degenerative spondylosis and facet degeneration as above. Please see above report for a full description of these findings. 4. Stable postoperative changes from prior PLIF at L2-3 and L3-4.  Assessment & Plan by Problem: Active Problems:   Lower extremity weakness  Timothy Ware is an 86yo male with PMH of HTN, degenerative joint disease, CKD stage IIIa, back pain with radiculopathy s/p transpedicular microdiscectomy T10/11 in early December presenting with low back pain, persistent RLE weakness since surgery, and complicated  UTI   Complicated UTI  Able to void but possibly having some retention with PVR of 50 and UA positive for UTI. No renal injury on CT, no current CVA tenderness. S/p vanc/cefepime in the ER. No signs of systemic infection currently.    - switch to ceftriaxone 1g qd - f/u urine culture  - q8h bladder scans, place catheter if unable to void    T10-T11 Spinal Stenosis s/p Microdiskectomy 02/2020 RLE extremity weakness and numbness since surgery in December, MRI showing persistent severe spinal stenosis T10-T11 with small amount of fluid thought to likely be post-operative changes by radiology and neurosurgery.  Discussed with neurosurgery and will plan for surgery once UTI resolves, possibly next Tuesday.   - tylenol prn, norco prn - he is hesitant to take increased pain medication as he thinks this caused confusion at home. Discussed that we can start additional medication if he is having any increased pain.  - cont. Home baclofen tid prn    HTN Normotensive.   - cont. Atenolol - will hold benazepril for now  CKD Stage IIIa - avoid nephrotoxic agents - discontinue home aleve  Diet: regular VTE: lovenox IVF: none Code: full  Dispo: Admit patient to Inpatient with expected length of stay greater than 2 midnights.  SignedVersie Starks, DO 04/26/2020, 9:47 AM  Pager: [phone]

## 2020-04-26 NOTE — ED Notes (Signed)
Pt transferred to purple zone on ED, pt resting on bed comfortable, bilateral rails up, vital monitor attached to pt.

## 2020-04-26 NOTE — ED Notes (Signed)
Patient transported to MRI 

## 2020-04-26 NOTE — ED Notes (Signed)
Nurse from [facility] refuse to get the pt for no having enough staff

## 2020-04-26 NOTE — ED Notes (Signed)
Medications not given at schedule time because they were not verify by pharmacy, medication will be given as soon pharmacy release them.

## 2020-04-26 NOTE — ED Provider Notes (Signed)
415 Case d/w Dr. Julien Girt who will see the patient regarding MRI and symptoms   Yurianna Tusing, MD 04/26/20 5329

## 2020-04-27 DIAGNOSIS — N1831 Chronic kidney disease, stage 3a: Secondary | ICD-10-CM

## 2020-04-27 DIAGNOSIS — M4804 Spinal stenosis, thoracic region: Principal | ICD-10-CM

## 2020-04-27 DIAGNOSIS — I10 Essential (primary) hypertension: Secondary | ICD-10-CM

## 2020-04-27 DIAGNOSIS — N3000 Acute cystitis without hematuria: Secondary | ICD-10-CM

## 2020-04-27 LAB — COMPREHENSIVE METABOLIC PANEL
ALT: 15 U/L (ref 0–44)
AST: 17 U/L (ref 15–41)
Albumin: 2.7 g/dL — ABNORMAL LOW (ref 3.5–5.0)
Alkaline Phosphatase: 100 U/L (ref 38–126)
Anion gap: 11 (ref 5–15)
BUN: 13 mg/dL (ref 8–23)
CO2: 23 mmol/L (ref 22–32)
Calcium: 8.5 mg/dL — ABNORMAL LOW (ref 8.9–10.3)
Chloride: 101 mmol/L (ref 98–111)
Creatinine, Ser: 1.27 mg/dL — ABNORMAL HIGH (ref 0.61–1.24)
GFR, Estimated: 56 mL/min — ABNORMAL LOW (ref >60)
Glucose, Bld: 103 mg/dL — ABNORMAL HIGH (ref 70–99)
Potassium: 4 mmol/L (ref 3.5–5.1)
Sodium: 135 mmol/L (ref 135–145)
Total Bilirubin: 0.6 mg/dL (ref 0.3–1.2)
Total Protein: 5.5 g/dL — ABNORMAL LOW (ref 6.5–8.1)

## 2020-04-27 LAB — CBC WITH DIFFERENTIAL/PLATELET
Abs Immature Granulocytes: 0.03 10*3/uL (ref 0.00–0.07)
Basophils Absolute: 0 10*3/uL (ref 0.0–0.1)
Basophils Relative: 0 %
Eosinophils Absolute: 0 10*3/uL (ref 0.0–0.5)
Eosinophils Relative: 0 %
HCT: 33 % — ABNORMAL LOW (ref 39.0–52.0)
Hemoglobin: 10.7 g/dL — ABNORMAL LOW (ref 13.0–17.0)
Immature Granulocytes: 0 %
Lymphocytes Relative: 27 %
Lymphs Abs: 2.2 10*3/uL (ref 0.7–4.0)
MCH: 30.4 pg (ref 26.0–34.0)
MCHC: 32.4 g/dL (ref 30.0–36.0)
MCV: 93.8 fL (ref 80.0–100.0)
Monocytes Absolute: 1 10*3/uL (ref 0.1–1.0)
Monocytes Relative: 12 %
Neutro Abs: 5 10*3/uL (ref 1.7–7.7)
Neutrophils Relative %: 61 %
Platelets: 256 10*3/uL (ref 150–400)
RBC: 3.52 MIL/uL — ABNORMAL LOW (ref 4.22–5.81)
RDW: 13.5 % (ref 11.5–15.5)
WBC: 8.3 10*3/uL (ref 4.0–10.5)
nRBC: 0 % (ref 0.0–0.2)

## 2020-04-27 MED ORDER — ALUM & MAG HYDROXIDE-SIMETH 200-200-20 MG/5ML PO SUSP
15.0000 mL | ORAL | Status: DC | PRN
  Administered 2020-04-27 – 2020-04-30 (×2): 15 mL via ORAL
  Filled 2020-04-27 (×2): qty 30
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
## 2020-04-27 NOTE — Progress Notes (Signed)
Rehab Admissions Coordinator Note:  Per PT evaluation, this patient was screened by Cheri Rous for appropriateness for an Inpatient Acute Rehab Consult.  Note plans for surgery on 2/8. Will await post op therapy recommendations to determine rehab needs.   Cheri Rous 04/27/2020, 4:02 PM  I can be reached at [phone].

## 2020-04-27 NOTE — Progress Notes (Signed)
HD#1 Subjective:  Overnight Events: None   This morning feels well. Continues to have numbness in the right leg and weakness states he sometimes can perceive hot and cold. Denies dysuria or hematuria.   Objective:  Vital signs in last 24 hours: Vitals:   04/26/20 2318 04/27/20 0259 04/27/20 0417 04/27/20 0418  BP: (!) 116/57  121/61 121/61  Pulse: 83  79 81  Resp: 18  18 18   Temp: 98.8 F (37.1 C)  98.6 F (37 C) 98.6 F (37 C)  TempSrc: Oral  Oral Oral  SpO2: 97%  98% 97%  Weight:  80.1 kg    Height:       Supplemental O2: Room Air SpO2: 97 %   Physical Exam:   Constitutional: Comfortable in bed, awake, no acute distress Respiratory: CTA, no wheezing Abdominal: Soft, non tender Neuro: Alert and oriented x 3, no sensation to light touch of RLE, sensation intact of LLE. Proximal muscle weakness of the RLE.  Skin: warm dry   Filed Weights   04/26/20 0516 04/26/20 1957 04/27/20 0259  Weight: 88.5 kg 80.1 kg 80.1 kg     Intake/Output Summary (Last 24 hours) at 04/27/2020 0553 Last data filed at 04/27/2020 0000 Gross per 24 hour  Intake 200 ml  Output 600 ml  Net -400 ml   Net IO Since Admission: -400 mL [04/27/20 0553]  Pertinent Labs: CBC Latest Ref Rng & Units 04/27/2020 04/25/2020 03/21/2020  WBC 4.0 - 10.5 K/uL 8.3 10.8(H) 5.3  Hemoglobin 13.0 - 17.0 g/dL 10.7(L) 11.2(L) 12.4(L)  Hematocrit 39.0 - 52.0 % 33.0(L) 34.4(L) 35.7(L)  Platelets 150 - 400 K/uL 256 280 104(L)    CMP Latest Ref Rng & Units 04/25/2020 03/16/2020 03/13/2020  Glucose 70 - 99 mg/dL 03/15/2020) 409(W) 119(J)  BUN 8 - 23 mg/dL 18 478(G) 95(A)  Creatinine 0.61 - 1.24 mg/dL 21(H 0.86) 5.78(I)  Sodium 135 - 145 mmol/L 133(L) 136 135  Potassium 3.5 - 5.1 mmol/L 3.9 4.0 4.0  Chloride 98 - 111 mmol/L 100 105 103  CO2 22 - 32 mmol/L 20(L) 23 18(L)  Calcium 8.9 - 10.3 mg/dL 6.96(E) 9.5(M) 8.4(X)  Total Protein 6.5 - 8.1 g/dL - - -  Total Bilirubin 0.3 - 1.2 mg/dL - - -  Alkaline Phos 38 - 126 U/L  - - -  AST 15 - 41 U/L - - -  ALT 0 - 44 U/L - - -    Imaging: No results found.  Assessment/Plan:   Active Problems:   Lower extremity weakness   Patient Summary: Timothy Ware is an 86yo male with PMH of HTN, degenerative joint disease, CKD stage IIIa, back pain with radiculopathy s/p transpedicular microdiscectomy T10/11 in early December presenting with low back pain, persistent RLE weakness since surgery, and complicated UTI  Complicated UTI  Good urine output of 1.2L overnight. Voiding spontaneously. UA positive for hgb and bacteria, possibly due to UTI. Urine cultures pending. Afebrile and HD stable, leukocytosis resolved. Will treat with 3 days of antibiotics prior to surgery. - ceftriaxone 1g qd day 2/3 - f/u urine culture   T10-T11 Spinal Stenosis s/p Microdiskectomy 02/2020 RLE extremity weakness and numbness since surgery in 12/3 MRI showing persistent severe spinal stenosis T10-T11 with small amount of fluid thought to likely be post-operative changes by radiology and neurosurgery. Discussed with neurosurgery that likely will need 3 day of antibiotics and should be stable for surgery.  - tylenol prn, norco prn  - cont. Home baclofen tid  prn  - PT/OT  HTN Normotensive.  - cont. Atenolol - will hold benazepril for now  CKD Stage IIIa Stable Cr. 1.26 today -avoid nephrotoxic agents  Diet: regular VTE: lovenox IVF: none Code: full   Dispo: Anticipated discharge to Home in pending surgery.   Quincy Simmonds, MD 04/27/2020, 5:53 AM Pager: [phone]  Please contact the on call pager after 5 pm and on weekends at [phone].

## 2020-04-27 NOTE — Progress Notes (Signed)
Date: 04/27/2020  Patient name: Timothy Ware  Medical record number: [id number]  Date of birth: 01-03-1935   I have seen and evaluated Timothy Ware and discussed their care with the Residency Team.  In brief, patient is an 86 year old male with past medical history of hypertension, degenerative joint disease, CKD stage III 8, chronic back pain with radiculopathy status post transpedicular microdiscectomy T10/11 in December 2021 presents with worsening back pain and difficulty with urination over the last couple weeks.  Patient states that prior to his surgery in December he had left lower extremity weakness and back pain.  States that after the surgery the back pain improved transiently but he noted that he had right lower extremity weakness and numbness.  He has also not been able to feel the sensation of when he needs to urinate or defecate.  Patient also noted that though the back pain initially improved it began to worsen a couple weeks ago.  Patient states that pain is in his right lower back and is worse with movement.  He was also noted to have confusion at home which he attributed to his pain medication.  No chest pain, no shortness of breath, no palpitations, no lightheadedness, no syncope, no nausea or vomiting, no fevers or chills.  Today, patient states that his pain is better controlled but he still has persistent right lower extremity weakness and numbness.  PMHx, Fam Hx, and/or Soc Hx : As per resident admit note  Vitals:   04/27/20 0754 04/27/20 1230  BP: (!) 132/56 (!) 146/72  Pulse: 96 77  Resp: 18 18  Temp: 98.2 F (36.8 C) (!) 97.3 F (36.3 C)  SpO2: 98% 98%   General: Awake, alert, oriented x3, NAD CVs: Regular rate and rhythm, normal heart sounds Lungs: CTA bilaterally Abdomen: Soft, nontender, nondistended, normoactive bowel sounds Extremities: No edema noted, nontender to palpation Psych: Normal mood and affect HEENT: Normocephalic, atraumatic Neuro: Oriented x3,  decreased sensation noted over bilateral feet as well as entire right lower extremity.  Power 5 out of 5 left lower extremity, 5 out of 5 dorsiflexion and plantar flexion of the right foot but 2-3 out of 5 more proximally in the right lower extremity  Assessment and Plan: I have seen and evaluated the patient as outlined above. I agree with the formulated Assessment and Plan as detailed in the residents' note, with the following changes:   1.  Severe spinal stenosis with right lower extremity weakness and back pain: -Patient presented to the ED with progressive right-sided lower back pain as well as right lower extremity numbness and weakness and difficulty urinating status post microdiscectomy in December 2021.  MRI done here showed extensive postop granulation tissue/fibrosis encompassing the thecal sac at the T10-11 level with persistent severe spinal stenosis as well as small amount of fluid signal within the T11-12 interspace with associated edema.  Given patient's symptoms and findings on MRI I suspect that his symptoms are secondary to his persistent severe spinal stenosis. -We will continue with pain control for now -PT/OT to evaluate patient -Neurosurgery follow-up recommendations appreciated.  Patient scheduled for OR on Tuesday for T10-11 transpedicular microdiscectomy as well as T10-12 fusion. -No further work-up at this time  2.  UTI: -Patient presented to the ED with worsening back pain and had a CT renal stone study which showed no renal calculi or obstructive changes.  He was however noted to have a mild leukocytosis of 10.8 as well as a UA that showed a positive  nitrite and many bacteria concerning for possible UTI.  However, patient had no symptoms of dysuria and has remained afebrile.  His UA did not have any leukocytes which is not consistent with a UTI. -Patient leukocytosis is resolved and he remains afebrile. -We will continue with ceftriaxone for now but if urine culture is  negative would consider stopping antibiotics Earl Lagos, MD 2/3/20221:39 PM

## 2020-04-27 NOTE — Progress Notes (Signed)
   Providing Compassionate, Quality Care - Together  NEUROSURGERY PROGRESS NOTE   S: No issues overnight.   O: EXAM:  BP (!) 146/72 (BP Location: Left Arm)   Pulse 77   Temp (!) 97.3 F (36.3 C) (Oral)   Resp 18   Ht 6\' 1"  (1.854 m)   Wt 80.1 kg   SpO2 98%   BMI 23.30 kg/m   Awake, alert, oriented  Speech fluent, appropriate  CNs grossly intact  5/5 BUE RLE prox 1/5, distal 3/5 LLE 3/5 T11 region numbness on R  ASSESSMENT:  86 y.o. male with  1. 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  Please do not hesitate to call with questions or concerns.   Tuesday, DO Neurosurgeon [facility] Cell: [phone]

## 2020-04-28 LAB — BASIC METABOLIC PANEL
Anion gap: 12 (ref 5–15)
BUN: 14 mg/dL (ref 8–23)
CO2: 22 mmol/L (ref 22–32)
Calcium: 8.6 mg/dL — ABNORMAL LOW (ref 8.9–10.3)
Chloride: 101 mmol/L (ref 98–111)
Creatinine, Ser: 1.09 mg/dL (ref 0.61–1.24)
GFR, Estimated: >60 mL/min (ref >60)
Glucose, Bld: 104 mg/dL — ABNORMAL HIGH (ref 70–99)
Potassium: 4 mmol/L (ref 3.5–5.1)
Sodium: 135 mmol/L (ref 135–145)

## 2020-04-28 LAB — CBC
HCT: 32.4 % — ABNORMAL LOW (ref 39.0–52.0)
Hemoglobin: 10.8 g/dL — ABNORMAL LOW (ref 13.0–17.0)
MCH: 31 pg (ref 26.0–34.0)
MCHC: 33.3 g/dL (ref 30.0–36.0)
MCV: 93.1 fL (ref 80.0–100.0)
Platelets: 275 10*3/uL (ref 150–400)
RBC: 3.48 MIL/uL — ABNORMAL LOW (ref 4.22–5.81)
RDW: 13.5 % (ref 11.5–15.5)
WBC: 9.6 10*3/uL (ref 4.0–10.5)
nRBC: 0 % (ref 0.0–0.2)

## 2020-04-28 LAB — URINE CULTURE
Culture: 80000 — AB
Special Requests: NORMAL

## 2020-04-28 MED ORDER — MAGNESIUM CITRATE PO SOLN
1.0000 | Freq: Once | ORAL | Status: AC
  Administered 2020-04-28: 1 via ORAL
  Filled 2020-04-28: qty 296

## 2020-04-28 MED ORDER — ONDANSETRON HCL 4 MG/2ML IJ SOLN
4.0000 mg | Freq: Four times a day (QID) | INTRAMUSCULAR | Status: DC | PRN
  Administered 2020-04-28 – 2020-04-30 (×2): 4 mg via INTRAVENOUS
  Filled 2020-04-28 (×2): qty 2

## 2020-04-28 MED ORDER — BISACODYL 5 MG PO TBEC: 5.0000 mg | DELAYED_RELEASE_TABLET | Freq: Every day | ORAL | Status: DC | PRN

## 2020-04-28 MED ORDER — BISACODYL 10 MG RE SUPP
10.0000 mg | Freq: Every day | RECTAL | Status: DC | PRN
  Administered 2020-04-29: 10 mg via RECTAL
  Filled 2020-04-28: qty 1

## 2020-04-28 MED ORDER — HYDROXYZINE HCL 50 MG/ML IM SOLN
50.0000 mg | Freq: Four times a day (QID) | INTRAMUSCULAR | Status: DC | PRN
  Filled 2020-04-28: qty 1

## 2020-04-28 NOTE — Progress Notes (Signed)
   Providing Compassionate, Quality Care - Together  NEUROSURGERY PROGRESS NOTE   S: No issues overnight. constipated this am  O: EXAM:  BP (!) 101/42 (BP Location: Left Arm)   Pulse 70   Temp 98.2 F (36.8 C) (Oral)   Resp 18   Ht 6\' 1"  (1.854 m)   Wt 80 kg   SpO2 97%   BMI 23.27 kg/m   Awake, alert, oriented  Speech fluent, appropriate  CNs grossly intact  5/5 BUE RLE prox 1/5, distal 3/5 LLE 3/5 T11 region numbness on R  ASSESSMENT:  86 y.o. male with  1. T10-11 recurrent HNP with severe stenosis and weakness  -planned OR Tuesday for T10-11 transpedic, T10-12 fusion  PLAN: - abx per primary - plan OR Tuesday - dvt ppx - scds - pain control - bowel regimen added   Thank you for allowing me to participate in this patient's care.  Please do not hesitate to call with questions or concerns.   Friday, DO Neurosurgeon [facility] Cell: [phone]
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
## 2020-04-28 NOTE — Progress Notes (Signed)
Physical Therapy Treatment Patient Details Name: Timothy Ware MRN: [id number] DOB: 1934/08/12 Today's Date: 04/28/2020    History of Present Illness 86 yo male with PMH of HTN, degenerative joint disease, CKD stage IIIa, back pain with radiculopathy, s/p transpedicular microdiscectomy T10/11 in early December presenting to ED from home on 2/1 with low back pain, increasing RLE numbness and weakness, and difficulty with urination. Pt being treated for UTI, plan for transpedicular decompression T10-11 and fusion T10-12 on 2/8.    PT Comments    Pt limited mostly by pain this session, moaning and crying out 2 abdominal pain, rectal pain (had just had a bowel movement), and back pain. The Stedy was utilized for transfer back to chair from [facility] Heavy mod assist was provided for transition to full standing in order to bring hip flaps down for seated rest. Pt was educated on maintaining back precautions throughout session. Will continue to follow and progress as able per POC.     Follow Up Recommendations  CIR     Equipment Recommendations  None recommended by PT    Recommendations for Other Services       Precautions / Restrictions Precautions Precautions: Fall;Back Precaution Booklet Issued: Yes (comment) Precaution Comments: Reviewed back precautions. Cues required for adherence during bed mobility and ADLs. Decreased BLE proprioception and absent RLE sensation. Paraplegia. Restrictions Weight Bearing Restrictions: No    Mobility  Bed Mobility Overal bed mobility: Needs Assistance Bed Mobility: Rolling;Sidelying to Sit;Sit to Sidelying Rolling: Min guard Sidelying to sit: Mod assist       General bed mobility comments: Pt was received sitting up on BSC  Transfers Overall transfer level: Needs assistance Equipment used: Rolling walker (2 wheeled) Sit to Stand: Mod assist;From elevated surface       General transfer comment: assist to power-up to full stand.VC's for proper  hand placement on seated surface for safety, and assist for controlled descent to chair.  Ambulation/Gait             General Gait Details: unable; pt took 1 step backwards off of the Stedy to prepare for stand>sit as the wheels of the [place] would not fit under the chair.   Stairs             Wheelchair Mobility    Modified Rankin (Stroke Patients Only)       Balance Overall balance assessment: Needs assistance Sitting-balance support: No upper extremity supported;Feet supported Sitting balance-Leahy Scale: Fair Sitting balance - Comments: Able to maintain static sitting balance at EOB without UE support. Braces BUE on bed surface 2/2 back pain.   Standing balance support: Bilateral upper extremity supported;During functional activity Standing balance-Leahy Scale: Poor Standing balance comment: reliant on external assist                            Cognition Arousal/Alertness: Awake/alert Behavior During Therapy: WFL for tasks assessed/performed Overall Cognitive Status: No family/caregiver present to determine baseline cognitive functioning                                 General Comments: Patient answers all questions appropriately. Aware of need for external assist with ADLs and ADL transfers.      Exercises      General Comments General comments (skin integrity, edema, etc.): Patient with difficulty moving bowels. NT provided warm prune juice during session.  Pertinent Vitals/Pain Pain Assessment: Faces Faces Pain Scale: Hurts whole lot Pain Location: back, abdomen, rectum Pain Descriptors / Indicators: Sore;Discomfort;Grimacing;Moaning;Cramping Pain Intervention(s): Limited activity within patient's tolerance;Monitored during session;Repositioned    Home Living Family/patient expects to be discharged to:: Private residence Living Arrangements: Spouse/significant other Available Help at Discharge: Family;Available 24  hours/day;Other (Comment) (wife; son intermittently) Type of Home: House Home Access: Stairs to enter   Home Layout: Able to live on main level with bedroom/bathroom Home Equipment: Gilmer Mor - single point;Walker - 2 wheels;Walker - standard;Shower seat;Cane - quad;Bedside commode;Wheelchair - manual;Hospital bed      Prior Function Level of Independence: Needs assistance  Gait / Transfers Assistance Needed: pt reports transferring bed<>w/c <> drop arm BSC only, states he is unable to ambulate ADL's / Homemaking Assistance Needed: Pt reports needing "half and half" assist for dressing, bathing, and wife does all over home tasks for pt including cooking, cleaning     PT Goals (current goals can now be found in the care plan section) Acute Rehab PT Goals Patient Stated Goal: To have a bowel movement. PT Goal Formulation: With patient Time For Goal Achievement: 05/11/20 Potential to Achieve Goals: Good Progress towards PT goals: Progressing toward goals    Frequency    Min 3X/week      PT Plan Current plan remains appropriate    Co-evaluation              AM-PAC PT "6 Clicks" Mobility   Outcome Measure  Help needed turning from your back to your side while in a flat bed without using bedrails?: A Little Help needed moving from lying on your back to sitting on the side of a flat bed without using bedrails?: A Lot Help needed moving to and from a bed to a chair (including a wheelchair)?: A Lot Help needed standing up from a chair using your arms (e.g., wheelchair or bedside chair)?: A Lot Help needed to walk in hospital room?: Total Help needed climbing 3-5 steps with a railing? : Total 6 Click Score: 11    End of Session Equipment Utilized During Treatment: Gait belt Activity Tolerance: Patient limited by fatigue;Patient limited by pain Patient left: in chair;with call bell/phone within reach;with nursing/sitter in room Nurse Communication: Mobility status PT Visit  Diagnosis: Other abnormalities of gait and mobility (R26.89);Difficulty in walking, not elsewhere classified (R26.2);Muscle weakness (generalized) (M62.81)     Time: 6440-3474 PT Time Calculation (min) (ACUTE ONLY): 20 min  Charges:  $Gait Training: 8-22 mins                     Conni Slipper, PT, DPT Acute Rehabilitation Services Pager: [phone] Office: [phone]    Marylynn Pearson 04/28/2020, 11:52 AM

## 2020-04-28 NOTE — Progress Notes (Signed)
HD#2 Subjective:  Overnight Events: None   This morning feels well. Continues to have numbness in the right leg and weakness states he sometimes can perceive hot and cold. Denies dysuria or hematuria.   Objective:  Vital signs in last 24 hours: Vitals:   04/27/20 1939 04/27/20 2337 04/28/20 0348 04/28/20 0432  BP: (!) 119/57 107/63 128/61   Pulse: 67 64 64   Resp: 18 20 18    Temp: 98.9 F (37.2 C) 98.4 F (36.9 C) 98.3 F (36.8 C)   TempSrc: Oral Oral Oral   SpO2: 99% 98% 97%   Weight:    80 kg  Height:       Supplemental O2: Room Air SpO2: 97 %   Physical Exam:   Constitutional: Comfortable in bed, awake, no acute distress Respiratory: CTA, no wheezing Abdominal: Soft, non tender, normal bowel sounds, no tenderness to palpation  Neuro: Alert and oriented x 3, no sensation to light touch of RLE, sensation intact of LLE. Proximal muscle weakness of the RLE.  Skin: warm dry   Filed Weights   04/26/20 1957 04/27/20 0259 04/28/20 0432  Weight: 80.1 kg 80.1 kg 80 kg     Intake/Output Summary (Last 24 hours) at 04/28/2020 0703 Last data filed at 04/28/2020 0432 Gross per 24 hour  Intake 840 ml  Output 1300 ml  Net -460 ml   Net IO Since Admission: -1,506 mL [04/28/20 0703]  Pertinent Labs: CBC Latest Ref Rng & Units 04/28/2020 04/27/2020 04/25/2020  WBC 4.0 - 10.5 K/uL 9.6 8.3 10.8(H)  Hemoglobin 13.0 - 17.0 g/dL 10.8(L) 10.7(L) 11.2(L)  Hematocrit 39.0 - 52.0 % 32.4(L) 33.0(L) 34.4(L)  Platelets 150 - 400 K/uL 275 256 280    CMP Latest Ref Rng & Units 04/28/2020 04/27/2020 04/25/2020  Glucose 70 - 99 mg/dL 06/23/2020) 542(H) 062(B)  BUN 8 - 23 mg/dL 14 13 18   Creatinine 0.61 - 1.24 mg/dL 762(G ) 3.15  Sodium 135 - 145 mmol/L 135 135 133(L)  Potassium 3.5 - 5.1 mmol/L 4.0 4.0 3.9  Chloride 98 - 111 mmol/L 101 101 100  CO2 22 - 32 mmol/L 22 23 20(L)  Calcium 8.9 - 10.3 mg/dL 1.76(H) 6.07) 3.7(T)  Total Protein 6.5 - 8.1 g/dL - 5.5(L) -  Total Bilirubin 0.3 - 1.2  mg/dL - 0.6 -  Alkaline Phos 38 - 126 U/L - 100 -  AST 15 - 41 U/L - 17 -  ALT 0 - 44 U/L - 15 -   Imaging: No results found.  Assessment/Plan:   Active Problems:   Lower extremity weakness   Patient Summary: Timothy Ware is an 86yo male with PMH of HTN, degenerative joint disease, CKD stage IIIa, back pain with radiculopathy s/p transpedicular microdiscectomy T10/11 in early December presenting with low back pain, persistent RLE weakness since surgery, and bacteruria.   Bacteruria  Uop 1.3L overnight. Afebrile, leukocytosis resolved. Urine culture with > 80,000 colonies of staph epidermidis unlikely he has a UTI. Will treat with 3 days of antibiotics prior to surgery. - ceftriaxone 1g qd will finish 3 days of antibiotics today   T10-T11 Spinal Stenosis s/p Microdiskectomy 02/2020 RLE extremity weakness and numbness since surgery in 12/3 MRI showing persistent severe spinal stenosis T10-T11 with small amount of fluid thought to likely be post-operative changes by radiology and neurosurgery. Discussed with neurosurgery that likely will need 3 day of antibiotics and should be stable for surgery.  - tylenol prn, norco prn  - cont. Home baclofen tid  prn  - PT/OT: recommending CIR  HTN -  Atenolol, will hold benazepril as he is normotensive  CKD Stage IIIa Stable Cr. 1.09  today  Diet: regular VTE: lovenox IVF: none Code: full   Dispo: Anticipated discharge to Home in pending surgery.   Quincy Simmonds, MD 04/28/2020, 7:03 AM Pager: [phone]  Please contact the on call pager after 5 pm and on weekends at [phone].

## 2020-04-29 LAB — CBC
HCT: 32.8 % — ABNORMAL LOW (ref 39.0–52.0)
Hemoglobin: 11.1 g/dL — ABNORMAL LOW (ref 13.0–17.0)
MCH: 31.5 pg (ref 26.0–34.0)
MCHC: 33.8 g/dL (ref 30.0–36.0)
MCV: 93.2 fL (ref 80.0–100.0)
Platelets: 279 10*3/uL (ref 150–400)
RBC: 3.52 MIL/uL — ABNORMAL LOW (ref 4.22–5.81)
RDW: 13.3 % (ref 11.5–15.5)
WBC: 8 10*3/uL (ref 4.0–10.5)
nRBC: 0 % (ref 0.0–0.2)

## 2020-04-29 LAB — BASIC METABOLIC PANEL
Anion gap: 11 (ref 5–15)
BUN: 17 mg/dL (ref 8–23)
CO2: 24 mmol/L (ref 22–32)
Calcium: 8.5 mg/dL — ABNORMAL LOW (ref 8.9–10.3)
Chloride: 104 mmol/L (ref 98–111)
Creatinine, Ser: 1.23 mg/dL (ref 0.61–1.24)
GFR, Estimated: 58 mL/min — ABNORMAL LOW (ref >60)
Glucose, Bld: 99 mg/dL (ref 70–99)
Potassium: 4.4 mmol/L (ref 3.5–5.1)
Sodium: 139 mmol/L (ref 135–145)

## 2020-04-29 NOTE — Progress Notes (Signed)
   Providing Compassionate, Quality Care - Together  NEUROSURGERY PROGRESS NOTE   S: No issues overnight.   O: EXAM:  BP 125/63 (BP Location: Left Arm)   Pulse 65   Temp 98 F (36.7 C) (Oral)   Resp 18   Ht 6\' 1"  (1.854 m)   Wt 80 kg   SpO2 97%   BMI 23.27 kg/m   Awake, alert, oriented  Speech fluent, appropriate  CNs grossly intact  5/5 BUE RLE prox 1/5, distal 3/5 LLE 3/5 T11 region numbness on R  ASSESSMENT: 86 y.o.malewith  1. 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  Please do not hesitate to call with questions or concerns.   Sunday, DO Neurosurgeon [facility] Cell: [phone]

## 2020-04-29 NOTE — Progress Notes (Signed)
HD#3 Subjective:  Overnight Events: None   This morning reports constipation, had  Suppository placed about 1 hours go but not BM since. Otherwise feeling well.  Objective:  Vital signs in last 24 hours: Vitals:   04/28/20 2322 04/29/20 0445 04/29/20 0729 04/29/20 1520  BP: (!) 123/53 120/61 125/63 (!) 130/57  Pulse: 68 61 65 66  Resp: 20 18 18 18   Temp: 98.3 F (36.8 C) 98.2 F (36.8 C) 98 F (36.7 C) 98.5 F (36.9 C)  TempSrc: Oral Oral Oral Oral  SpO2: 98% 99% 97% 100%  Weight:      Height:       Supplemental O2: Room Air SpO2: 100 %   Physical Exam:   Constitutional: alert and awake, sitting up in bed, no acute distress Respiratory: CTA, no wheezing Abdominal: Soft, non tender, normal bowel sounds, no tenderness to palpation  Neuro: Alert and oriented x 3, 0/5 sensation of RLE  Skin: warm dry   Filed Weights   04/26/20 1957 04/27/20 0259 04/28/20 0432  Weight: 80.1 kg 80.1 kg 80 kg     Intake/Output Summary (Last 24 hours) at 04/29/2020 1835 Last data filed at 04/29/2020 1500 Gross per 24 hour  Intake 240 ml  Output 1175 ml  Net -935 ml   Net IO Since Admission: -3,106 mL [04/29/20 1835]  Pertinent Labs: CBC Latest Ref Rng & Units 04/29/2020 04/28/2020 04/27/2020  WBC 4.0 - 10.5 K/uL 8.0 9.6 8.3  Hemoglobin 13.0 - 17.0 g/dL 11.1(L) 10.8(L) 10.7(L)  Hematocrit 39.0 - 52.0 % 32.8(L) 32.4(L) 33.0(L)  Platelets 150 - 400 K/uL 279 275 256    CMP Latest Ref Rng & Units 04/29/2020 04/28/2020 04/27/2020  Glucose 70 - 99 mg/dL 99 06/25/2020) 283(T)  BUN 8 - 23 mg/dL 17 14 13   Creatinine 0.61 - 1.24 mg/dL 517(O 1.60)  Sodium 135 - 145 mmol/L 139 135 135  Potassium 3.5 - 5.1 mmol/L 4.4 4.0 4.0  Chloride 98 - 111 mmol/L 104 101 101  CO2 22 - 32 mmol/L 24 22 23   Calcium 8.9 - 10.3 mg/dL 7.37) 1.06(Y) )  Total Protein 6.5 - 8.1 g/dL - - 5.5(L)  Total Bilirubin 0.3 - 1.2 mg/dL - - 0.6  Alkaline Phos 38 - 126 U/L - - 100  AST 15 - 41 U/L - - 17  ALT 0 - 44 U/L -  - 15   Imaging: No results found.  Assessment/Plan:   Active Problems:   Lower extremity weakness   Patient Summary: Timothy Ware is an 86yo male with PMH of HTN, degenerative joint disease, CKD stage IIIa, back pain with radiculopathy s/p transpedicular microdiscectomy T10/11 in early December presenting with low back pain, persistent RLE weakness since surgery admitted for neurosurgery and consulted for bacteruria.   T10-T11 Spinal Stenosis s/p Microdiskectomy 02/2020 RLE extremity weakness and numbness since surgery in 12/3 MRI showing persistent severe spinal stenosis T10-T11 with small amount of fluid thought to likely be post-operative changes. - Plan for OR on Tuesday 2/8 - tylenol prn, norco prn  - cont. Home baclofen tid prn  - PT/OT: recommending CIR  Constipation Patient reports constipation has one small BM yesterday. - bisacodyl, senna-docusate, miralax - bisacodyl suppository  HTN -  Atenolol, hold benazepril as he is normotensive  CKD Stage IIIa Cr. 1.23  Today. Remains stable  Diet: regular VTE: lovenox IVF: none Code: full    03/2020, MD 04/29/2020, 6:35 PM Pager: [phone]  Please contact the on call  pager after 5 pm and on weekends at [phone].

## 2020-04-30 DIAGNOSIS — M4714 Other spondylosis with myelopathy, thoracic region: Secondary | ICD-10-CM

## 2020-04-30 DIAGNOSIS — N319 Neuromuscular dysfunction of bladder, unspecified: Secondary | ICD-10-CM

## 2020-04-30 NOTE — Progress Notes (Signed)
   Providing Compassionate, Quality Care - Together  NEUROSURGERY PROGRESS NOTE   S: No issues overnight.   O: EXAM:  BP 132/60 (BP Location: Left Arm)   Pulse (!) 58   Temp 97.6 F (36.4 C) (Oral)   Resp 16   Ht 6\' 1"  (1.854 m)   Wt 80 kg   SpO2 100%   BMI 23.27 kg/m   Awake, alert, oriented  Speech fluent, appropriate  CNs grossly intact  5/5 BUE RLE prox 1/5, distal 3/5 LLE 3/5 T11 region numbness on R  ASSESSMENT: 86 y.o.malewith  1. 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  Please do not hesitate to call with questions or concerns.   Saturday, DO Neurosurgeon [facility] Cell: [phone]

## 2020-04-30 NOTE — Progress Notes (Signed)
HD#4 Subjective:  Overnight Events: None   Feeling well this morning, no dysuria, urinating well, states he has has several bladder scans since admission without increase post void residuals.  No acute complaints.  Objective:  Vital signs in last 24 hours: Vitals:   04/29/20 2326 04/30/20 0421 04/30/20 0739 04/30/20 1139  BP: 114/63 136/68 132/60 (!) 128/53  Pulse: 64 71 (!) 58 62  Resp: 18 20 16 16   Temp: 98.1 F (36.7 C) 98.2 F (36.8 C) 97.6 F (36.4 C) (!) 97.5 F (36.4 C)  TempSrc: Oral Oral Oral Oral  SpO2: 97% 100% 100% 100%  Weight:      Height:       Supplemental O2: Room Air SpO2: 100 %   Physical Exam:   Constitutional: alert and awake, Laying in bed, no acute distress Abdominal: Soft, non tender,no tenderness to palpation  Neuro: Alert and oriented x 3, 0/5  sensation to light touch of RLE, can feel light tough to bottom of right foot  Skin: warm, dry   Filed Weights   04/26/20 1957 04/27/20 0259 04/28/20 0432  Weight: 80.1 kg 80.1 kg 80 kg     Intake/Output Summary (Last 24 hours) at 04/30/2020 1227 Last data filed at 04/30/2020 1154 Gross per 24 hour  Intake 1440 ml  Output 3275 ml  Net -1835 ml   Net IO Since Admission: -3,941 mL [04/30/20 1227]  Pertinent Labs: CBC Latest Ref Rng & Units 04/29/2020 04/28/2020 04/27/2020  WBC 4.0 - 10.5 K/uL 8.0 9.6 8.3  Hemoglobin 13.0 - 17.0 g/dL 11.1(L) 10.8(L) 10.7(L)  Hematocrit 39.0 - 52.0 % 32.8(L) 32.4(L) 33.0(L)  Platelets 150 - 400 K/uL 279 275 256    CMP Latest Ref Rng & Units 04/29/2020 04/28/2020 04/27/2020  Glucose 70 - 99 mg/dL 99 06/25/2020) 902(I)  BUN 8 - 23 mg/dL 17 14 13   Creatinine 0.61 - 1.24 mg/dL 097(D 5.32)  Sodium 135 - 145 mmol/L 139 135 135  Potassium 3.5 - 5.1 mmol/L 4.4 4.0 4.0  Chloride 98 - 111 mmol/L 104 101 101  CO2 22 - 32 mmol/L 24 22 23   Calcium 8.9 - 10.3 mg/dL 9.92) 4.26(S) )  Total Protein 6.5 - 8.1 g/dL - - 5.5(L)  Total Bilirubin 0.3 - 1.2 mg/dL - - 0.6  Alkaline  Phos 38 - 126 U/L - - 100  AST 15 - 41 U/L - - 17  ALT 0 - 44 U/L - - 15   Imaging: No results found.  Assessment/Plan:   Active Problems:   Lower extremity weakness   Patient Summary: Timothy Ware is an 86yo male with PMH of HTN, degenerative joint disease, CKD stage IIIa, back pain with radiculopathy s/p transpedicular microdiscectomy T10/11 in early December presenting with low back pain, persistent RLE weakness since surgery admitted for neurosurgery and consulted for bacteruria.   T10-T11 Spinal Stenosis s/p Microdiskectomy 02/2020 RLE extremity weakness and numbness since surgery in 12/3 MRI showing persistent severe spinal stenosis T10-T11 with small amount of fluid thought to likely be post-operative changes. - Plan for OR on Tuesday 2/8 - tylenol prn, norco prn  - cont. Home baclofen tid prn  - PT/OT: recommending CIR  Constipation Large BM recorded ysterday -Continue bowel regimen  HTN BP normotensive continue atenolol  CKD Stage IIIa 2.3 L urine overnight, no dysuria, no elevated postvoid residuals.   Diet: regular VTE: lovenox IVF: none Code: full   03/2020, MD 04/30/2020, 12:27 PM Pager: [phone]  Please contact  the on call pager after 5 pm and on weekends at [phone].

## 2020-05-01 LAB — TYPE AND SCREEN
ABO/RH(D): O POS
Antibody Screen: NEGATIVE

## 2020-05-01 LAB — COMPREHENSIVE METABOLIC PANEL WITH GFR
ALT: 51 U/L — ABNORMAL HIGH (ref 0–44)
AST: 56 U/L — ABNORMAL HIGH (ref 15–41)
Albumin: 2.9 g/dL — ABNORMAL LOW (ref 3.5–5.0)
Alkaline Phosphatase: 103 U/L (ref 38–126)
Anion gap: 11 (ref 5–15)
BUN: 17 mg/dL (ref 8–23)
CO2: 27 mmol/L (ref 22–32)
Calcium: 8.5 mg/dL — ABNORMAL LOW (ref 8.9–10.3)
Chloride: 97 mmol/L — ABNORMAL LOW (ref 98–111)
Creatinine, Ser: 1.26 mg/dL — ABNORMAL HIGH (ref 0.61–1.24)
GFR, Estimated: 57 mL/min — ABNORMAL LOW
Glucose, Bld: 111 mg/dL — ABNORMAL HIGH (ref 70–99)
Potassium: 4 mmol/L (ref 3.5–5.1)
Sodium: 135 mmol/L (ref 135–145)
Total Bilirubin: 0.3 mg/dL (ref 0.3–1.2)
Total Protein: 5.7 g/dL — ABNORMAL LOW (ref 6.5–8.1)

## 2020-05-01 LAB — CBC
HCT: 36.9 % — ABNORMAL LOW (ref 39.0–52.0)
Hemoglobin: 11.6 g/dL — ABNORMAL LOW (ref 13.0–17.0)
MCH: 29.7 pg (ref 26.0–34.0)
MCHC: 31.4 g/dL (ref 30.0–36.0)
MCV: 94.6 fL (ref 80.0–100.0)
Platelets: 308 10*3/uL (ref 150–400)
RBC: 3.9 MIL/uL — ABNORMAL LOW (ref 4.22–5.81)
RDW: 13.2 % (ref 11.5–15.5)
WBC: 8.7 10*3/uL (ref 4.0–10.5)
nRBC: 0 % (ref 0.0–0.2)

## 2020-05-01 LAB — URINE CULTURE: Culture: NO GROWTH

## 2020-05-01 LAB — ABO/RH: ABO/RH(D): O POS

## 2020-05-01 MED ORDER — CHLORHEXIDINE GLUCONATE CLOTH 2 % EX PADS
6.0000 | MEDICATED_PAD | Freq: Once | CUTANEOUS | Status: AC
  Administered 2020-05-01: 6 via TOPICAL

## 2020-05-01 MED ORDER — VANCOMYCIN HCL IN DEXTROSE 1-5 GM/200ML-% IV SOLN
1000.0000 mg | INTRAVENOUS | Status: AC
  Administered 2020-05-02: 1000 mg via INTRAVENOUS
  Filled 2020-05-01: qty 200

## 2020-05-01 MED ORDER — ORAL CARE MOUTH RINSE
15.0000 mL | Freq: Once | OROMUCOSAL | Status: AC
  Administered 2020-05-01: 15 mL via OROMUCOSAL

## 2020-05-01 MED ORDER — CHLORHEXIDINE GLUCONATE CLOTH 2 % EX PADS
6.0000 | MEDICATED_PAD | Freq: Once | CUTANEOUS | Status: AC
  Administered 2020-05-02: 6 via TOPICAL

## 2020-05-01 MED ORDER — LACTATED RINGERS IV SOLN: INTRAVENOUS | Status: DC

## 2020-05-01 NOTE — Progress Notes (Signed)
Orthopedic Tech Progress Note Patient Details:  Timothy Ware [birth year] [id number] Called in order to [employer] for an LSO/UPPER BACK SUPPORT Patient ID: Overton Boggus, male   DOB: 20-Mar-1935, 86 y.o.   MRN: [id number]   Donald Pore 05/01/2020, 12:52 PM

## 2020-05-01 NOTE — Progress Notes (Signed)
Physical Therapy Treatment Patient Details Name: Timothy Ware MRN: [id number] DOB: Sep 19, 1934 Today's Date: 05/01/2020    History of Present Illness 86 yo male with PMH of HTN, degenerative joint disease, CKD stage IIIa, back pain with radiculopathy, s/p transpedicular microdiscectomy T10/11 in early December presenting to ED from home on 2/1 with low back pain, increasing RLE numbness and weakness, and difficulty with urination. Pt being treated for UTI, plan for transpedicular decompression T10-11 and fusion T10-12 on 2/8.    PT Comments    Pt progressing very slowly at this time and is awaiting surgery. Pain significantly limiting session and pt was unable to tolerate sitting up in the chair. Pt returned to bed and we worked to position him with [facility] elevated ~20 degrees as he reports laying completely flat most of the time for the last 4 days. Will follow-up in the day following surgery.     Follow Up Recommendations  CIR     Equipment Recommendations  None recommended by PT    Recommendations for Other Services       Precautions / Restrictions Precautions Precautions: Fall;Back Precaution Booklet Issued: Yes (comment) Precaution Comments: Reviewed back precautions. Cues required for adherence during bed mobility and ADLs. Decreased BLE proprioception and absent RLE sensation. Paraplegia. Restrictions Weight Bearing Restrictions: No    Mobility  Bed Mobility Overal bed mobility: Needs Assistance Bed Mobility: Rolling;Sidelying to Sit;Sit to Sidelying Rolling: Min guard Sidelying to sit: Mod assist     Sit to sidelying: Mod assist General bed mobility comments: Assist for advancement of RLE off EOB and to elevate BLE's back up into bed at end of session.  Transfers Overall transfer level: Needs assistance     Sit to Stand: Mod assist;From elevated surface         General transfer comment: Assist to power-up to full stand. Pt immediately with trunk flexion due to  pain with upright posture in standing, and was leaning onto center bar of [place] in sitting to alleviate pain as well. Pt unable to tolerate sitting in the chair at this time 2 pain and was returned to the bed.  Ambulation/Gait             General Gait Details: unable   Stairs             Wheelchair Mobility    Modified Rankin (Stroke Patients Only)       Balance Overall balance assessment: Needs assistance Sitting-balance support: No upper extremity supported;Feet supported Sitting balance-Leahy Scale: Fair Sitting balance - Comments: Able to maintain static sitting balance at EOB without UE support. Braces BUE on bed surface 2/2 back pain.   Standing balance support: Bilateral upper extremity supported;During functional activity Standing balance-Leahy Scale: Poor Standing balance comment: reliant on external assist                            Cognition Arousal/Alertness: Awake/alert Behavior During Therapy: WFL for tasks assessed/performed Overall Cognitive Status: No family/caregiver present to determine baseline cognitive functioning                                 General Comments: Patient answers all questions appropriately. Aware of need for external assist with ADLs and ADL transfers.      Exercises      General Comments        Pertinent Vitals/Pain Pain Assessment: Faces Faces  Pain Scale: Hurts whole lot Pain Location: back, abdomen, rectum Pain Descriptors / Indicators: Sore;Discomfort;Grimacing;Moaning;Cramping Pain Intervention(s): Limited activity within patient's tolerance;Monitored during session;Repositioned    Home Living                      Prior Function            PT Goals (current goals can now be found in the care plan section) Acute Rehab PT Goals Patient Stated Goal: To have a bowel movement. PT Goal Formulation: With patient Time For Goal Achievement: 05/11/20 Potential to Achieve Goals:  Good Progress towards PT goals: Progressing toward goals    Frequency    Min 3X/week      PT Plan Current plan remains appropriate    Co-evaluation              AM-PAC PT "6 Clicks" Mobility   Outcome Measure  Help needed turning from your back to your side while in a flat bed without using bedrails?: A Little Help needed moving from lying on your back to sitting on the side of a flat bed without using bedrails?: A Lot Help needed moving to and from a bed to a chair (including a wheelchair)?: A Lot Help needed standing up from a chair using your arms (e.g., wheelchair or bedside chair)?: A Lot Help needed to walk in hospital room?: Total Help needed climbing 3-5 steps with a railing? : Total 6 Click Score: 11    End of Session Equipment Utilized During Treatment: Gait belt Activity Tolerance: Patient limited by fatigue;Patient limited by pain Patient left: in chair;with call bell/phone within reach;with nursing/sitter in room Nurse Communication: Mobility status PT Visit Diagnosis: Other abnormalities of gait and mobility (R26.89);Difficulty in walking, not elsewhere classified (R26.2);Muscle weakness (generalized) (M62.81)     Time: 6568-1275 PT Time Calculation (min) (ACUTE ONLY): 32 min  Charges:  $Gait Training: 23-37 mins                     Timothy Ware, PT, DPT Acute Rehabilitation Services Pager: [phone] Office: [phone]    Timothy Ware 05/01/2020, 12:29 PM

## 2020-05-01 NOTE — Progress Notes (Signed)
Subjective: Patient reports ready for surgeyy  Objective: Vital signs in last 24 hours: Temp:  [97.5 F (36.4 C)-98.5 F (36.9 C)] 98.3 F (36.8 C) (02/07 0742) Pulse Rate:  [61-71] 62 (02/07 0742) Resp:  [16-20] 19 (02/07 0742) BP: (108-128)/(47-77) 122/77 (02/07 0742) SpO2:  [95 %-100 %] 95 % (02/07 0742)  Intake/Output from previous day: 02/06 0701 - 02/07 0700 In: 960 [P.O.:960] Out: 2350 [Urine:2350] Intake/Output this shift: Total I/O In: 240 [P.O.:240] Out: -   Physical Exam: Persistent right greater than left leg weakness.  Lab Results: Recent Labs    04/29/20 0459  WBC 8.0  HGB 11.1*  HCT 32.8*  PLT 279   BMET Recent Labs    04/29/20 0459  NA 139  K 4.4  CL 104  CO2 24  GLUCOSE 99  BUN 17  CREATININE 1.23  CALCIUM 8.5*    Studies/Results: No results found.  Assessment/Plan: Plan OR in AM    LOS: 5 days    Dorian Heckle, MD 05/01/2020, 8:37 AM

## 2020-05-02 ENCOUNTER — Inpatient Hospital Stay (HOSPITAL_COMMUNITY): Payer: Medicare Other | Admitting: Anesthesiology

## 2020-05-02 ENCOUNTER — Encounter (HOSPITAL_COMMUNITY)
Admission: EM | Disposition: A | Discharge status: Rehabilitation Facility | Payer: Self-pay | Source: Home / Self Care | Attending: Neurosurgery

## 2020-05-02 ENCOUNTER — Inpatient Hospital Stay (HOSPITAL_COMMUNITY): Payer: Medicare Other

## 2020-05-02 ENCOUNTER — Encounter (HOSPITAL_COMMUNITY): Payer: Self-pay | Admitting: Internal Medicine

## 2020-05-02 LAB — SURGICAL PCR SCREEN
MRSA, PCR: NEGATIVE
Staphylococcus aureus: NEGATIVE

## 2020-05-02 SURGERY — POSTERIOR LUMBAR FUSION 2 LEVEL
Anesthesia: General | Site: Back | Laterality: Right

## 2020-05-02 MED ORDER — LACTATED RINGERS IV SOLN: INTRAVENOUS | Status: DC

## 2020-05-02 MED ORDER — POLYETHYLENE GLYCOL 3350 17 G PO PACK: 17.0000 g | PACK | Freq: Every day | ORAL | Status: DC | PRN

## 2020-05-02 MED ORDER — FLEET ENEMA 7-19 GM/118ML RE ENEM: 1.0000 | ENEMA | Freq: Once | RECTAL | Status: DC | PRN

## 2020-05-02 MED ORDER — SUFENTANIL CITRATE 50 MCG/ML IV SOLN
INTRAVENOUS | Status: AC
  Filled 2020-05-02: qty 1

## 2020-05-02 MED ORDER — SUFENTANIL CITRATE 50 MCG/ML IV SOLN
INTRAVENOUS | Status: DC | PRN
  Administered 2020-05-02: 5 ug via INTRAVENOUS
  Administered 2020-05-02: 10 ug via INTRAVENOUS
  Administered 2020-05-02: 5 ug via INTRAVENOUS
  Administered 2020-05-02: 10 ug via INTRAVENOUS

## 2020-05-02 MED ORDER — PANTOPRAZOLE SODIUM 40 MG IV SOLR: 40.0000 mg | Freq: Every day | INTRAVENOUS | Status: DC

## 2020-05-02 MED ORDER — ZOLPIDEM TARTRATE 5 MG PO TABS: 5.0000 mg | ORAL_TABLET | Freq: Every evening | ORAL | Status: DC | PRN

## 2020-05-02 MED ORDER — PROPOFOL 500 MG/50ML IV EMUL
INTRAVENOUS | Status: DC | PRN
  Administered 2020-05-02: 75 ug/kg/min via INTRAVENOUS

## 2020-05-02 MED ORDER — ONDANSETRON HCL 4 MG PO TABS: 4.0000 mg | ORAL_TABLET | Freq: Four times a day (QID) | ORAL | Status: DC | PRN

## 2020-05-02 MED ORDER — VANCOMYCIN HCL 1250 MG/250ML IV SOLN
1250.0000 mg | INTRAVENOUS | Status: DC
  Administered 2020-05-03: 1250 mg via INTRAVENOUS
  Filled 2020-05-02 (×2): qty 250

## 2020-05-02 MED ORDER — 0.9 % SODIUM CHLORIDE (POUR BTL) OPTIME
TOPICAL | Status: DC | PRN
  Administered 2020-05-02: 1000 mL

## 2020-05-02 MED ORDER — SODIUM CHLORIDE 0.9% FLUSH: 3.0000 mL | INTRAVENOUS | Status: DC | PRN

## 2020-05-02 MED ORDER — VANCOMYCIN HCL 1000 MG IV SOLR
INTRAVENOUS | Status: DC | PRN
  Administered 2020-05-02: 1000 mg via TOPICAL

## 2020-05-02 MED ORDER — HYDROMORPHONE HCL 1 MG/ML IJ SOLN
0.5000 mg | INTRAMUSCULAR | Status: DC | PRN
  Administered 2020-05-02 (×2): 0.5 mg via INTRAVENOUS
  Filled 2020-05-02 (×2): qty 0.5

## 2020-05-02 MED ORDER — AMISULPRIDE (ANTIEMETIC) 5 MG/2ML IV SOLN: 10.0000 mg | Freq: Once | INTRAVENOUS | Status: DC | PRN

## 2020-05-02 MED ORDER — ONDANSETRON HCL 4 MG/2ML IJ SOLN: 4.0000 mg | Freq: Four times a day (QID) | INTRAMUSCULAR | Status: DC | PRN

## 2020-05-02 MED ORDER — HYDROMORPHONE HCL 1 MG/ML IJ SOLN
INTRAMUSCULAR | Status: AC
  Administered 2020-05-02: 0.5 mg via INTRAVENOUS
  Filled 2020-05-02: qty 1

## 2020-05-02 MED ORDER — THROMBIN 5000 UNITS EX SOLR
CUTANEOUS | Status: AC
  Filled 2020-05-02: qty 5000

## 2020-05-02 MED ORDER — PROSOURCE NO CARB PO LIQD
30.0000 mL | Freq: Every day | ORAL | Status: DC
  Filled 2020-05-02: qty 30

## 2020-05-02 MED ORDER — PROSOURCE PLUS PO LIQD
30.0000 mL | Freq: Every day | ORAL | Status: DC
  Administered 2020-05-03 – 2020-05-06 (×4): 30 mL via ORAL
  Filled 2020-05-02 (×5): qty 30

## 2020-05-02 MED ORDER — PROPOFOL 10 MG/ML IV BOLUS
INTRAVENOUS | Status: AC
  Filled 2020-05-02: qty 20

## 2020-05-02 MED ORDER — METHOCARBAMOL 1000 MG/10ML IJ SOLN
500.0000 mg | Freq: Four times a day (QID) | INTRAVENOUS | Status: DC | PRN
  Administered 2020-05-02: 500 mg via INTRAVENOUS
  Filled 2020-05-02 (×2): qty 5

## 2020-05-02 MED ORDER — THROMBIN 5000 UNITS EX SOLR: OROMUCOSAL | Status: DC | PRN

## 2020-05-02 MED ORDER — ADULT MULTIVITAMIN W/MINERALS CH
1.0000 | ORAL_TABLET | Freq: Every day | ORAL | Status: DC
  Administered 2020-05-03 – 2020-05-06 (×4): 1 via ORAL
  Filled 2020-05-02 (×4): qty 1

## 2020-05-02 MED ORDER — DEXAMETHASONE SODIUM PHOSPHATE 10 MG/ML IJ SOLN
INTRAMUSCULAR | Status: DC | PRN
  Administered 2020-05-02: 10 mg via INTRAVENOUS

## 2020-05-02 MED ORDER — CHLORHEXIDINE GLUCONATE 0.12 % MT SOLN
OROMUCOSAL | Status: AC
  Administered 2020-05-02: 15 mL via OROMUCOSAL
  Filled 2020-05-02: qty 15

## 2020-05-02 MED ORDER — NAPROXEN SODIUM 275 MG PO TABS
275.0000 mg | ORAL_TABLET | Freq: Every day | ORAL | Status: DC | PRN
Start: 2020-05-02 — End: 2020-05-06
  Filled 2020-05-02 (×2): qty 1

## 2020-05-02 MED ORDER — MENTHOL 3 MG MT LOZG: 1.0000 | LOZENGE | OROMUCOSAL | Status: DC | PRN

## 2020-05-02 MED ORDER — HYDROCODONE-ACETAMINOPHEN 5-325 MG PO TABS
1.0000 | ORAL_TABLET | ORAL | Status: DC | PRN
Start: 2020-05-02 — End: 2020-05-06
  Administered 2020-05-02 – 2020-05-05 (×8): 2 via ORAL
  Administered 2020-05-05: 1 via ORAL
  Administered 2020-05-05 – 2020-05-06 (×2): 2 via ORAL
  Administered 2020-05-06: 1 via ORAL
  Filled 2020-05-02 (×5): qty 2
  Filled 2020-05-02 (×2): qty 1
  Filled 2020-05-02 (×5): qty 2

## 2020-05-02 MED ORDER — OXYCODONE HCL 5 MG PO TABS: 5.0000 mg | ORAL_TABLET | ORAL | Status: DC | PRN

## 2020-05-02 MED ORDER — LIDOCAINE HCL (CARDIAC) PF 100 MG/5ML IV SOSY
PREFILLED_SYRINGE | INTRAVENOUS | Status: DC | PRN
  Administered 2020-05-02: 100 mg via INTRAVENOUS

## 2020-05-02 MED ORDER — MAGNESIUM OXIDE 400 (241.3 MG) MG PO TABS
400.0000 mg | ORAL_TABLET | Freq: Every day | ORAL | Status: DC
  Administered 2020-05-03 – 2020-05-06 (×4): 400 mg via ORAL
  Filled 2020-05-02 (×6): qty 1

## 2020-05-02 MED ORDER — BUPIVACAINE LIPOSOME 1.3 % IJ SUSP
20.0000 mL | INTRAMUSCULAR | Status: AC
  Administered 2020-05-02: 20 mL
  Filled 2020-05-02: qty 20

## 2020-05-02 MED ORDER — SODIUM CHLORIDE 0.9% FLUSH
3.0000 mL | Freq: Two times a day (BID) | INTRAVENOUS | Status: DC
  Administered 2020-05-03 – 2020-05-06 (×5): 3 mL via INTRAVENOUS

## 2020-05-02 MED ORDER — ALUM & MAG HYDROXIDE-SIMETH 200-200-20 MG/5ML PO SUSP: 30.0000 mL | Freq: Four times a day (QID) | ORAL | Status: DC | PRN

## 2020-05-02 MED ORDER — BUPIVACAINE HCL (PF) 0.5 % IJ SOLN
INTRAMUSCULAR | Status: DC | PRN
  Administered 2020-05-02: 10 mL

## 2020-05-02 MED ORDER — HYDROMORPHONE HCL 1 MG/ML IJ SOLN
0.2500 mg | INTRAMUSCULAR | Status: DC | PRN
  Administered 2020-05-02 (×2): 0.5 mg via INTRAVENOUS

## 2020-05-02 MED ORDER — LIDOCAINE-EPINEPHRINE 1 %-1:100000 IJ SOLN
INTRAMUSCULAR | Status: DC | PRN
  Administered 2020-05-02: 10 mL

## 2020-05-02 MED ORDER — METHOCARBAMOL 500 MG PO TABS: 500.0000 mg | ORAL_TABLET | Freq: Four times a day (QID) | ORAL | Status: DC | PRN

## 2020-05-02 MED ORDER — PROPOFOL 10 MG/ML IV BOLUS
INTRAVENOUS | Status: DC | PRN
  Administered 2020-05-02: 110 mg via INTRAVENOUS

## 2020-05-02 MED ORDER — KETOROLAC TROMETHAMINE 15 MG/ML IJ SOLN
7.5000 mg | Freq: Four times a day (QID) | INTRAMUSCULAR | Status: AC
  Administered 2020-05-02 – 2020-05-03 (×4): 7.5 mg via INTRAVENOUS
  Filled 2020-05-02 (×4): qty 1

## 2020-05-02 MED ORDER — SENNOSIDES-DOCUSATE SODIUM 8.6-50 MG PO TABS
2.0000 | ORAL_TABLET | Freq: Two times a day (BID) | ORAL | Status: DC
  Administered 2020-05-02 – 2020-05-06 (×8): 2 via ORAL
  Filled 2020-05-02 (×8): qty 2

## 2020-05-02 MED ORDER — PANTOPRAZOLE SODIUM 40 MG PO TBEC
40.0000 mg | DELAYED_RELEASE_TABLET | Freq: Every day | ORAL | Status: DC
  Administered 2020-05-02 – 2020-05-05 (×4): 40 mg via ORAL
  Filled 2020-05-02 (×4): qty 1

## 2020-05-02 MED ORDER — BISACODYL 10 MG RE SUPP: 10.0000 mg | Freq: Every day | RECTAL | Status: DC | PRN

## 2020-05-02 MED ORDER — SUCCINYLCHOLINE CHLORIDE 20 MG/ML IJ SOLN
INTRAMUSCULAR | Status: DC | PRN
  Administered 2020-05-02: 120 mg via INTRAVENOUS

## 2020-05-02 MED ORDER — VANCOMYCIN HCL 1000 MG IV SOLR
INTRAVENOUS | Status: AC
  Filled 2020-05-02: qty 1000

## 2020-05-02 MED ORDER — LIDOCAINE-EPINEPHRINE 1 %-1:100000 IJ SOLN
INTRAMUSCULAR | Status: AC
  Filled 2020-05-02: qty 1

## 2020-05-02 MED ORDER — ACETAMINOPHEN 650 MG RE SUPP: 650.0000 mg | RECTAL | Status: DC | PRN

## 2020-05-02 MED ORDER — CHLORHEXIDINE GLUCONATE 0.12 % MT SOLN: 15.0000 mL | Freq: Once | OROMUCOSAL | Status: AC

## 2020-05-02 MED ORDER — BUPIVACAINE HCL (PF) 0.5 % IJ SOLN
INTRAMUSCULAR | Status: AC
  Filled 2020-05-02: qty 30

## 2020-05-02 MED ORDER — KCL IN DEXTROSE-NACL 20-5-0.45 MEQ/L-%-% IV SOLN
INTRAVENOUS | Status: DC
  Filled 2020-05-02: qty 1000

## 2020-05-02 MED ORDER — ORAL CARE MOUTH RINSE: 15.0000 mL | Freq: Once | OROMUCOSAL | Status: AC

## 2020-05-02 MED ORDER — VANCOMYCIN HCL 1250 MG/250ML IV SOLN: 1250.0000 mg | INTRAVENOUS | Status: DC

## 2020-05-02 MED ORDER — PHENOL 1.4 % MT LIQD: 1.0000 | OROMUCOSAL | Status: DC | PRN

## 2020-05-02 MED ORDER — ACETAMINOPHEN 325 MG PO TABS: 650.0000 mg | ORAL_TABLET | ORAL | Status: DC | PRN

## 2020-05-02 MED ORDER — CHLORHEXIDINE GLUCONATE CLOTH 2 % EX PADS
6.0000 | MEDICATED_PAD | Freq: Every day | CUTANEOUS | Status: DC
  Administered 2020-05-02 – 2020-05-06 (×5): 6 via TOPICAL

## 2020-05-02 MED ORDER — PHENYLEPHRINE HCL (PRESSORS) 10 MG/ML IV SOLN
INTRAVENOUS | Status: DC | PRN
  Administered 2020-05-02: 120 ug via INTRAVENOUS
  Administered 2020-05-02: 80 ug via INTRAVENOUS

## 2020-05-02 MED ORDER — PHENYLEPHRINE HCL-NACL 10-0.9 MG/250ML-% IV SOLN
INTRAVENOUS | Status: DC | PRN
  Administered 2020-05-02: 100 ug/min via INTRAVENOUS

## 2020-05-02 SURGICAL SUPPLY — 74 items
ADH SKN CLS APL DERMABOND .7 (GAUZE/BANDAGES/DRESSINGS) ×1
BASKET BONE COLLECTION (BASKET) ×2 IMPLANT
BLADE CLIPPER SURG (BLADE) IMPLANT
BUR MATCHSTICK NEURO 3.0 LAGG (BURR) ×2 IMPLANT
BUR PRECISION FLUTE 5.0 (BURR) ×2 IMPLANT
CANISTER SUCT 3000ML PPV (MISCELLANEOUS) ×2 IMPLANT
CARTRIDGE OIL MAESTRO DRILL (MISCELLANEOUS) ×1 IMPLANT
CNTNR URN SCR LID CUP LEK RST (MISCELLANEOUS) ×1 IMPLANT
CONT SPEC 4OZ STRL OR WHT (MISCELLANEOUS) ×2
COVER BACK TABLE 60X90IN (DRAPES) ×2 IMPLANT
COVER WAND RF STERILE (DRAPES) ×2 IMPLANT
DECANTER SPIKE VIAL GLASS SM (MISCELLANEOUS) ×2 IMPLANT
DERMABOND ADVANCED (GAUZE/BANDAGES/DRESSINGS) ×1
DERMABOND ADVANCED .7 DNX12 (GAUZE/BANDAGES/DRESSINGS) ×1 IMPLANT
DIFFUSER DRILL AIR PNEUMATIC (MISCELLANEOUS) ×2 IMPLANT
DRAPE C-ARM 42X72 X-RAY (DRAPES) ×2 IMPLANT
DRAPE C-ARMOR (DRAPES) IMPLANT
DRAPE LAPAROTOMY 100X72X124 (DRAPES) ×2 IMPLANT
DRAPE MICROSCOPE LEICA (MISCELLANEOUS) ×2 IMPLANT
DRAPE SURG 17X23 STRL (DRAPES) ×2 IMPLANT
DRSG OPSITE POSTOP 4X8 (GAUZE/BANDAGES/DRESSINGS) ×2 IMPLANT
DURAPREP 26ML APPLICATOR (WOUND CARE) ×2 IMPLANT
ELECT NVM5 SURFACE MEP/EMG (ELECTRODE) ×2 IMPLANT
ELECT REM PT RETURN 9FT ADLT (ELECTROSURGICAL) ×2
ELECTRODE REM PT RTRN 9FT ADLT (ELECTROSURGICAL) ×1 IMPLANT
EVACUATOR 1/8 PVC DRAIN (DRAIN) ×2 IMPLANT
GAUZE 4X4 16PLY RFD (DISPOSABLE) IMPLANT
GAUZE SPONGE 4X4 12PLY STRL (GAUZE/BANDAGES/DRESSINGS) ×2 IMPLANT
GLOVE BIO SURGEON STRL SZ8 (GLOVE) ×8 IMPLANT
GLOVE BIOGEL PI IND STRL 8.5 (GLOVE) ×4 IMPLANT
GLOVE BIOGEL PI INDICATOR 8.5 (GLOVE) ×4
GLOVE ECLIPSE 8.0 STRL XLNG CF (GLOVE) ×10 IMPLANT
GLOVE EXAM NITRILE XL STR (GLOVE) IMPLANT
GLOVE INDICATOR 8.0 STRL GRN (GLOVE) ×2 IMPLANT
GLOVE SRG 8 PF TXTR STRL LF DI (GLOVE) ×2 IMPLANT
GLOVE SURG UNDER POLY LF SZ8 (GLOVE) ×4
GOWN STRL REUS W/ TWL LRG LVL3 (GOWN DISPOSABLE) IMPLANT
GOWN STRL REUS W/ TWL XL LVL3 (GOWN DISPOSABLE) ×5 IMPLANT
GOWN STRL REUS W/TWL 2XL LVL3 (GOWN DISPOSABLE) ×2 IMPLANT
GOWN STRL REUS W/TWL LRG LVL3 (GOWN DISPOSABLE)
GOWN STRL REUS W/TWL XL LVL3 (GOWN DISPOSABLE) ×10
HEMOSTAT POWDER KIT SURGIFOAM (HEMOSTASIS) ×2 IMPLANT
KIT BASIN OR (CUSTOM PROCEDURE TRAY) ×2 IMPLANT
KIT POSITION SURG JACKSON T1 (MISCELLANEOUS) ×2 IMPLANT
KIT TURNOVER KIT B (KITS) ×2 IMPLANT
MILL MEDIUM DISP (BLADE) IMPLANT
MODULE EMG NEEDLE SSEP NVM5 (NEEDLE) ×2 IMPLANT
NEEDLE HYPO 21X1.5 SAFETY (NEEDLE) ×2 IMPLANT
NEEDLE HYPO 25X1 1.5 SAFETY (NEEDLE) ×2 IMPLANT
NEEDLE SPNL 18GX3.5 QUINCKE PK (NEEDLE) IMPLANT
NS IRRIG 1000ML POUR BTL (IV SOLUTION) ×2 IMPLANT
OIL CARTRIDGE MAESTRO DRILL (MISCELLANEOUS) ×2
PACK LAMINECTOMY NEURO (CUSTOM PROCEDURE TRAY) ×2 IMPLANT
PAD ARMBOARD 7.5X6 YLW CONV (MISCELLANEOUS) ×6 IMPLANT
PATTIES SURGICAL .5 X.5 (GAUZE/BANDAGES/DRESSINGS) IMPLANT
PATTIES SURGICAL .5 X1 (DISPOSABLE) IMPLANT
PATTIES SURGICAL 1X1 (DISPOSABLE) IMPLANT
ROD RELINE LORD 5.5X80MM (Rod) ×4 IMPLANT
SCREW LOCK RELINE 5.5 TULIP (Screw) ×8 IMPLANT
SCREW RELINE POLY 5.5X50MM (Screw) ×4 IMPLANT
SCREW RELINE-O POLY 6.5X50MM (Screw) ×4 IMPLANT
SPONGE LAP 4X18 RFD (DISPOSABLE) IMPLANT
SPONGE SURGIFOAM ABS GEL 100 (HEMOSTASIS) IMPLANT
STAPLER SKIN PROX WIDE 3.9 (STAPLE) IMPLANT
SUT VIC AB 1 CT1 18XBRD ANBCTR (SUTURE) ×2 IMPLANT
SUT VIC AB 1 CT1 8-18 (SUTURE) ×4
SUT VIC AB 2-0 CT1 18 (SUTURE) ×4 IMPLANT
SUT VIC AB 3-0 SH 8-18 (SUTURE) ×2 IMPLANT
SYR 20ML LL LF (SYRINGE) ×2 IMPLANT
SYR 5ML LL (SYRINGE) IMPLANT
TOWEL GREEN STERILE (TOWEL DISPOSABLE) ×2 IMPLANT
TOWEL GREEN STERILE FF (TOWEL DISPOSABLE) ×2 IMPLANT
TRAY FOLEY MTR SLVR 16FR STAT (SET/KITS/TRAYS/PACK) ×2 IMPLANT
WATER STERILE IRR 1000ML POUR (IV SOLUTION) ×2 IMPLANT

## 2020-05-02 NOTE — Anesthesia Procedure Notes (Signed)
Procedure Name: Intubation Date/Time: 05/02/2020 12:53 PM Performed by: Darletta Moll, CRNA Pre-anesthesia Checklist: Patient identified, Emergency Drugs available, Suction available and Patient being monitored Patient Re-evaluated:Patient Re-evaluated prior to induction Oxygen Delivery Method: Circle system utilized Preoxygenation: Pre-oxygenation with 100% oxygen Induction Type: IV induction Ventilation: Mask ventilation without difficulty Laryngoscope Size: Mac and 4 Grade View: Grade I Tube type: Oral Tube size: 7.5 mm Number of attempts: 1 Airway Equipment and Method: Stylet Placement Confirmation: ETT inserted through vocal cords under direct vision,  positive ETCO2 and breath sounds checked- equal and bilateral Secured at: 22 cm Tube secured with: Tape Dental Injury: Teeth and Oropharynx as per pre-operative assessment

## 2020-05-02 NOTE — Brief Op Note (Signed)
05/02/2020  4:44 PM  PATIENT:  Timothy Ware  86 y.o. male  PRE-OPERATIVE DIAGNOSIS:  Thoracic spinal stenosis, recurrent disc herniation, paraparesis, spinal instability  POST-OPERATIVE DIAGNOSIS:    Thoracic spinal stenosis, recurrent disc herniation, paraparesis, spinal instability  PROCEDURE:  Procedure(s): Right transpedicular decompression/microdiscectomy at Thoracic ten-eleven with pedicle screw  fixation Thoracic ten to Thoracic twelve with posterolateral arthrodesis  SURGEON:  Surgeon(s) and Role:    Maeola Harman, MD - Primary    * Dawley, Alan Mulder, DO - Assisting  PHYSICIAN ASSISTANT:   ASSISTANTS: Poteat, RN   ANESTHESIA:   general  EBL:  150 mL   BLOOD ADMINISTERED:none  DRAINS: (Medium) Hemovact drain(s) in the epidural space with  Suction Open   LOCAL MEDICATIONS USED:  MARCAINE    and LIDOCAINE   SPECIMEN:  No Specimen  DISPOSITION OF SPECIMEN:  N/A  COUNTS:  YES  TOURNIQUET:  * No tourniquets in log *  DICTATION: Patient has spinal cord compression at T 10 T 11 due to a large disc herniation with leg weakness and numbness and intractable pain. He had previously undergone left transpedicular discectomy, but has recurrent symptoms and recurrent disc herniation.  It was elected to take him to surgery for T 10 T 11 laminectomy and resection of disc herniation with spinal cord monitoring and T 10 - T 12 pedicle screw fixation and posterolateral arthrodesis.  Procedure: Patient was brought to the operating room and following the smooth and uncomplicated induction of general endotracheal anesthesia he was placed in a prone position on the [place] Table.  His back was prepped and draped in the usual sterile fashion with betadine scrub and DuraPrep. Area of planned incision was infiltrated with local lidocaine. Incision was made in the midline and carried to the lumbodorsal fascia which was incised on both sides of midline. A laminectomy of T 10 and resection of T 11  pedicle was performed on the right under microscopic visualization and the left side of the spinal cord was also decompressed.   A large disc herniation was identified and the and the spinal cord dura was defined and a large amount of herniated disc material was removed with resultant decompression of the spinal cord from both sides.  This was done with a variety of microinstruments under the operating microscope.  A calcified portion of the disc herniation was peeled away from the spinal cord dura which was widely decompressed.  At this point it was felt that all neural elements were well decompressed and we felt we had removed the vast majority of disc material compressing the spinal cord and certainly all the material that we felt we could safely remove . Pedicle screw fixation was then placed bilaterally at T 10 (5.5 x 50 mm) and T 12 (6.5 x 50 mm) levels. 80 mm rods were affixed to the screw heads and locked down in situ.  40 cc bone autograft was packed in the posterolateral region from T 10 through T 12 levels bilaterally.   Vancomycin was placed in the wound.  The wound was then irrigated with saline. Hemostasis was assured with bipolar electrocautery and Surgifoam. A medium Hemovac drain was placed though a separate stab incision.  The lumbodorsal fascia was closed with 0 Vicryl sutures the subcutaneous tissues reapproximated 2-0 Vicryl inverted sutures and the skin edges were reapproximated with 3-0 Vicryl subcuticular stitch. The wound is dressed with demabond and an occlusive dressing. Patient was extubated in the operating room and taken to recovery  in stable and satisfactory condition having tolerated her operation well counts were correct at the end of the case. Neuro-monitoring was stable throughout the case (motor), but sensory testing actually improved during the surgery.    PLAN OF CARE: Admit to inpatient   PATIENT DISPOSITION:  PACU - hemodynamically stable.   Delay start of  Pharmacological VTE agent (>24hrs) due to surgical blood loss or risk of bleeding: yes

## 2020-05-02 NOTE — Progress Notes (Signed)
Patient transferred to  4N04. Report given to receiving RN Katie. Patient 5/10 pain scale  And in no signs of distress at the time of transfer.

## 2020-05-02 NOTE — OR Nursing (Signed)
April RN gave report to [facility] RN for bed 4

## 2020-05-02 NOTE — Progress Notes (Signed)
Awake, sore in back, still sleepy.  Moving left leg spontaneously with good strength.  Less spontaneous movement on right.  Not yet awake enough to follow commands.

## 2020-05-02 NOTE — Transfer of Care (Signed)
Immediate Anesthesia Transfer of Care Note  Patient: Timothy Ware  Procedure(s) Performed: Right transpedicular decompression/microdiscectomy at Thoracic ten-eleven with fixation Thoracic ten to Thoracic twelve (Right Back)  Patient Location: PACU  Anesthesia Type:General  Level of Consciousness: drowsy  Airway & Oxygen Therapy: Patient Spontanous Breathing and Patient connected to nasal cannula oxygen  Post-op Assessment: Report given to RN and Post -op Vital signs reviewed and stable  Post vital signs: Reviewed and stable  Last Vitals:  Vitals Value Taken Time  BP 140/63 05/02/20 1649  Temp    Pulse 62 05/02/20 1651  Resp 24 05/02/20 1651  SpO2 100 % 05/02/20 1651  Vitals shown include unvalidated device data.  Last Pain:  Vitals:   05/02/20 1144  TempSrc: Oral  PainSc:       Patients Stated Pain Goal: 3 (05/02/20 0511)  Complications: No complications documented.

## 2020-05-02 NOTE — Anesthesia Preprocedure Evaluation (Signed)
Anesthesia Evaluation  Patient identified by MRN, date of birth, ID band Patient awake    Reviewed: Allergy & Precautions, NPO status , Patient's Chart, lab work & pertinent test results, reviewed documented beta blocker date and time   History of Anesthesia Complications (+) PONV and history of anesthetic complications  Airway Mallampati: II  TM Distance: >3 FB Neck ROM: Full    Dental  (+) Teeth Intact   Pulmonary neg pulmonary ROS,    Pulmonary exam normal        Cardiovascular hypertension, Pt. on home beta blockers and Pt. on medications (-) angina+ CAD and + Cardiac Stents (2013)  (-) DOE Normal cardiovascular exam     Neuro/Psych negative neurological ROS  negative psych ROS   GI/Hepatic negative GI ROS, Neg liver ROS,   Endo/Other  negative endocrine ROS  Renal/GU Renal disease (Cr 1.34)  negative genitourinary   Musculoskeletal  (+) Arthritis ,   Abdominal   Peds  Hematology  (+) Blood dyscrasia, anemia ,   Anesthesia Other Findings   Reproductive/Obstetrics                             Anesthesia Physical  Anesthesia Plan  ASA: III  Anesthesia Plan: General   Post-op Pain Management:    Induction: Intravenous  PONV Risk Score and Plan: 4 or greater and Ondansetron, Dexamethasone, Treatment may vary due to age or medical condition, Propofol infusion and TIVA  Airway Management Planned: Oral ETT  Additional Equipment:   Intra-op Plan:   Post-operative Plan: Extubation in OR  Informed Consent: I have reviewed the patients History and Physical, chart, labs and discussed the procedure including the risks, benefits and alternatives for the proposed anesthesia with the patient or authorized representative who has indicated his/her understanding and acceptance.     Dental advisory given  Plan Discussed with: CRNA  Anesthesia Plan Comments: (See PAT note written  02/24/2020 by Shonna Chock, PA-C. )        Anesthesia Quick Evaluation

## 2020-05-02 NOTE — Progress Notes (Signed)
Subjective: Patient reports that he is ready for surgery. Does have some complaints of a "bellyache". No acute events overnight.   Objective: Vital signs in last 24 hours: Temp:  [98.1 F (36.7 C)-98.4 F (36.9 C)] 98.1 F (36.7 C) (02/08 0511) Pulse Rate:  [59-70] 66 (02/08 0511) Resp:  [18-20] 18 (02/08 0511) BP: (116-141)/(54-73) 116/64 (02/08 0511) SpO2:  [97 %-100 %] 99 % (02/08 0511)  Intake/Output from previous day: 02/07 0701 - 02/08 0700 In: 480 [P.O.:480] Out: 1000 [Urine:1000] Intake/Output this shift: No intake/output data recorded.  Physical Exam: Patient is awake, A/O X 4, conversant, and in good spirits. Speech is fluent and appropriate. Doing well. MAEW with good strength.     Lab Results: Recent Labs    05/01/20 1606  WBC 8.7  HGB 11.6*  HCT 36.9*  PLT 308   BMET Recent Labs    05/01/20 1606  NA 135  K 4.0  CL 97*  CO2 27  GLUCOSE 111*  BUN 17  CREATININE 1.26*  CALCIUM 8.5*    Studies/Results: No results found.  Assessment/Plan: To the OR today for Right transpedicular decompression/microdiskectomy T10-11 with fixation T10-T12 today. Continue NPO status in preporation for surgery. Continue working on pain control and constipation.    LOS: 6 days     Council Mechanic, DNP, NP-C 05/02/2020, 7:46 AM

## 2020-05-02 NOTE — Op Note (Signed)
05/02/2020  4:44 PM  PATIENT:  Timothy Ware  85 y.o. male  PRE-OPERATIVE DIAGNOSIS:  Thoracic spinal stenosis, recurrent disc herniation, paraparesis, spinal instability  POST-OPERATIVE DIAGNOSIS:    Thoracic spinal stenosis, recurrent disc herniation, paraparesis, spinal instability  PROCEDURE:  Procedure(s): Right transpedicular decompression/microdiscectomy at Thoracic ten-eleven with pedicle screw  fixation Thoracic ten to Thoracic twelve with posterolateral arthrodesis  SURGEON:  Surgeon(s) and Role:    * Consuela Widener, MD - Primary    * Dawley, Troy C, DO - Assisting  PHYSICIAN ASSISTANT:   ASSISTANTS: Poteat, RN   ANESTHESIA:   general  EBL:  150 mL   BLOOD ADMINISTERED:none  DRAINS: (Medium) Hemovact drain(s) in the epidural space with  Suction Open   LOCAL MEDICATIONS USED:  MARCAINE    and LIDOCAINE   SPECIMEN:  No Specimen  DISPOSITION OF SPECIMEN:  N/A  COUNTS:  YES  TOURNIQUET:  * No tourniquets in log *  DICTATION: Patient has spinal cord compression at T 10 T 11 due to a large disc herniation with leg weakness and numbness and intractable pain. He had previously undergone left transpedicular discectomy, but has recurrent symptoms and recurrent disc herniation.  It was elected to take him to surgery for T 10 T 11 laminectomy and resection of disc herniation with spinal cord monitoring and T 10 - T 12 pedicle screw fixation and posterolateral arthrodesis.  Procedure: Patient was brought to the operating room and following the smooth and uncomplicated induction of general endotracheal anesthesia he was placed in a prone position on the Jackson Table.  His back was prepped and draped in the usual sterile fashion with betadine scrub and DuraPrep. Area of planned incision was infiltrated with local lidocaine. Incision was made in the midline and carried to the lumbodorsal fascia which was incised on both sides of midline. A laminectomy of T 10 and resection of T 11  pedicle was performed on the right under microscopic visualization and the left side of the spinal cord was also decompressed.   A large disc herniation was identified and the and the spinal cord dura was defined and a large amount of herniated disc material was removed with resultant decompression of the spinal cord from both sides.  This was done with a variety of microinstruments under the operating microscope.  A calcified portion of the disc herniation was peeled away from the spinal cord dura which was widely decompressed.  At this point it was felt that all neural elements were well decompressed and we felt we had removed the vast majority of disc material compressing the spinal cord and certainly all the material that we felt we could safely remove . Pedicle screw fixation was then placed bilaterally at T 10 (5.5 x 50 mm) and T 12 (6.5 x 50 mm) levels. 80 mm rods were affixed to the screw heads and locked down in situ.  40 cc bone autograft was packed in the posterolateral region from T 10 through T 12 levels bilaterally.   Vancomycin was placed in the wound.  The wound was then irrigated with saline. Hemostasis was assured with bipolar electrocautery and Surgifoam. A medium Hemovac drain was placed though a separate stab incision.  The lumbodorsal fascia was closed with 0 Vicryl sutures the subcutaneous tissues reapproximated 2-0 Vicryl inverted sutures and the skin edges were reapproximated with 3-0 Vicryl subcuticular stitch. The wound is dressed with demabond and an occlusive dressing. Patient was extubated in the operating room and taken to recovery   in stable and satisfactory condition having tolerated her operation well counts were correct at the end of the case. Neuro-monitoring was stable throughout the case (motor), but sensory testing actually improved during the surgery.    PLAN OF CARE: Admit to inpatient   PATIENT DISPOSITION:  PACU - hemodynamically stable.   Delay start of  Pharmacological VTE agent (>24hrs) due to surgical blood loss or risk of bleeding: yes

## 2020-05-02 NOTE — Interval H&P Note (Signed)
History and Physical Interval Note:  05/02/2020 11:47 AM  Timothy Ware  has presented today for surgery, with the diagnosis of Thoracic spinal stenosis.  The various methods of treatment have been discussed with the patient and family. After consideration of risks, benefits and other options for treatment, the patient has consented to  Procedure(s): Right transpedicular decompression/microdiscectomy at Thoracic 10-11 with fixation Thoracic 10 to Thoracic 12 (Right) as a surgical intervention.  The patient's history has been reviewed, patient examined, no change in status, stable for surgery.  I have reviewed the patient's chart and labs.  Questions were answered to the patient's satisfaction.     Dorian Heckle

## 2020-05-02 NOTE — Progress Notes (Addendum)
Pharmacy Antibiotic Note  Timothy Ware is a 86 y.o. male admitted on 04/25/2020 with surgical prophylaxis.  Pharmacy has been consulted for vancomycin dosing.  Underwent R transpedicular decompression/microdiscectomy at Thoracic 10-11 with pedicle screw fixation, 10-12 with posterolateral arthrodesis on 2/8. Has drain in place post-op. Last dose of vancomycin on 2/8@1212 . Scr yesterday was 1.26 (CrCl 50 mL/min). Afebrile.   Plan:  Will order vancomycin 1250 mg IV every 24 hours - will time to start from last dose Monitor renal fx, clinical pic, and duration of therapy   Height: 6\' 1"  (185.4 cm) Weight: 80 kg (176 lb 5.9 oz) IBW/kg (Calculated) : 79.9  Temp (24hrs), Avg:98 F (36.7 C), Min:97 F (36.1 C), Max:98.6 F (37 C)  Recent Labs  Lab 04/25/20 2033 04/27/20 0456 04/28/20 0527 04/29/20 0459 05/01/20 1606  WBC 10.8* 8.3 9.6 8.0 8.7  CREATININE 1.22 1.27* 1.09 1.23 1.26*    Estimated Creatinine Clearance: 50.2 mL/min (A) (by C-G formula based on SCr of 1.26 mg/dL (H)).    Allergies  Allergen Reactions  . Penicillins Hives    Antimicrobials this admission: Vancomycin  2/8 >>   Dose adjustments this admission: N/A  Microbiology results: 2/2 UCx: staph epi 2/4 UCx: ng  2/8 MRSA PCR: neg  Thank you for allowing pharmacy to be a part of this patient's care.  4/8, PharmD, BCCCP Clinical Pharmacist  Phone: [phone] 05/02/2020 7:01 PM  Please check AMION for all [facility] phone numbers After 10:00 PM, call Main Pharmacy [phone]

## 2020-05-03 LAB — BASIC METABOLIC PANEL
Anion gap: 11 (ref 5–15)
BUN: 20 mg/dL (ref 8–23)
CO2: 22 mmol/L (ref 22–32)
Calcium: 8.3 mg/dL — ABNORMAL LOW (ref 8.9–10.3)
Chloride: 98 mmol/L (ref 98–111)
Creatinine, Ser: 1.05 mg/dL (ref 0.61–1.24)
GFR, Estimated: >60 mL/min (ref >60)
Glucose, Bld: 149 mg/dL — ABNORMAL HIGH (ref 70–99)
Potassium: 4.6 mmol/L (ref 3.5–5.1)
Sodium: 131 mmol/L — ABNORMAL LOW (ref 135–145)

## 2020-05-03 MED ORDER — VANCOMYCIN HCL 1250 MG/250ML IV SOLN
1250.0000 mg | INTRAVENOUS | Status: DC
  Administered 2020-05-04 – 2020-05-06 (×3): 1250 mg via INTRAVENOUS
  Filled 2020-05-03 (×3): qty 250

## 2020-05-03 NOTE — Plan of Care (Signed)
  Problem: Education: Goal: Ability to verbalize activity precautions or restrictions will improve Outcome: Progressing Goal: Knowledge of the prescribed therapeutic regimen will improve Outcome: Progressing Goal: Understanding of discharge needs will improve Outcome: Progressing   Problem: Activity: Goal: Ability to avoid complications of mobility impairment will improve Outcome: Progressing   
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
## 2020-05-03 NOTE — Progress Notes (Signed)
Inpatient Rehab Admissions Coordinator:   Met with patient at bedside to discuss potential CIR admission. Pt. Stated interest, was here in Dec 2022. I will reach out to Pt.'s wife to confirm support . Will pursue for potential admit this week or next  pending bed availability.  Clemens Catholic, [place], [place] Admissions Coordinator  [phone] ([place]) [phone] (office)

## 2020-05-03 NOTE — Progress Notes (Signed)
Subjective: Patient reports that he has improved RLE strength and has had a significant reduction in his preoperative pain. No acute events overnight.   Objective: Vital signs in last 24 hours: Temp:  [97 F (36.1 C)-98.6 F (37 C)] 97.6 F (36.4 C) (02/09 0754) Pulse Rate:  [55-77] 77 (02/09 0754) Resp:  [10-23] 15 (02/09 0754) BP: (119-168)/(51-95) 160/69 (02/09 0754) SpO2:  [99 %-100 %] 100 % (02/09 0754)  Intake/Output from previous day: 02/08 0701 - 02/09 0700 In: 1950 [I.V.:1950] Out: 1565 [Urine:1115; Drains:300; Blood:150] Intake/Output this shift: No intake/output data recorded.  Physical Exam: Patient is awake, A/O X 4, conversant, and in good spirits. Speech is fluent and appropriate. Doing well. MAEW. RLE strength has improved. Dressing is clean dry intact. Incision is well approximated with no drainage, erythema, or edema. Drain with 300 ml of output overnight.    Lab Results: Recent Labs    05/01/20 1606  WBC 8.7  HGB 11.6*  HCT 36.9*  PLT 308   BMET Recent Labs    05/01/20 1606 05/03/20 0042  NA 135 131*  K 4.0 4.6  CL 97* 98  CO2 27 22  GLUCOSE 111* 149*  BUN 17 20  CREATININE 1.26* 1.05  CALCIUM 8.5* 8.3*    Studies/Results: DG Thoracic Spine 2 View  Result Date: 05/02/2020 CLINICAL DATA:  Surgery, elective. Additional history provided: Surgery: Right transpedicular decompression/micro discectomy at thoracic 10-11 with fixation thoracic 10 to thoracic 12. Provided fluoroscopy time 20 seconds (11.18 mGy). EXAM: THORACIC SPINE 2 VIEWS; DG C-ARM 1-60 MIN COMPARISON:  MRI of the thoracic spine 04/26/2020. FINDINGS: PA and lateral view intraoperative fluoroscopic images of the thoracic spine are submitted, 3 images total. The images demonstrate bilateral pedicle screws at what are presumed to be the T10 and T12 levels. Vertical interconnecting rods were not present at the time the images were taken. Overlying retractors. IMPRESSION: 3 intraoperative  fluoroscopic images of the thoracic spine, as described. Electronically Signed   By: Jackey Loge DO   On: 05/02/2020 17:10   DG C-Arm 1-60 Min  Result Date: 05/02/2020 CLINICAL DATA:  Surgery, elective. Additional history provided: Surgery: Right transpedicular decompression/micro discectomy at thoracic 10-11 with fixation thoracic 10 to thoracic 12. Provided fluoroscopy time 20 seconds (11.18 mGy). EXAM: THORACIC SPINE 2 VIEWS; DG C-ARM 1-60 MIN COMPARISON:  MRI of the thoracic spine 04/26/2020. FINDINGS: PA and lateral view intraoperative fluoroscopic images of the thoracic spine are submitted, 3 images total. The images demonstrate bilateral pedicle screws at what are presumed to be the T10 and T12 levels. Vertical interconnecting rods were not present at the time the images were taken. Overlying retractors. IMPRESSION: 3 intraoperative fluoroscopic images of the thoracic spine, as described. Electronically Signed   By: Jackey Loge DO   On: 05/02/2020 17:10    Assessment/Plan: Patient is post-op day 1 s/p right transpedicular decompression/microdiscectomy at T10/11 with pedicle screw  fixation T10/12 with posterolateral arthrodesis. He is recovering well and reports a resolution of his preoperative pain and improved strength in his lower extrimities.  His only complaint is mild incisional discomfort.  He is awaiting PT/OT evaluation. TLSO brace when OOB. Continue working on pain control, mobility and ambulating patient. Will leave drain in for today. Will likely  need rehab once medically appropriate.     LOS: 7 days     Council Mechanic, DNP, NP-C 05/03/2020, 8:06 AM

## 2020-05-03 NOTE — Progress Notes (Addendum)
Physical Therapy Evaluation Patient Details Name: Timothy Ware MRN: [id number] DOB: 10/01/1934 Today's Date: 05/03/2020   History of Present Illness  86 yo male with PMH of HTN, degenerative joint disease, CKD stage IIIa, back pain with radiculopathy, s/p transpedicular microdiscectomy T10/11 in early December presenting to ED from home on 2/1 with low back pain, increasing RLE numbness and weakness, and difficulty with urination. Pt being treated for UTI, pt s/p transpedicular decompression T10-11 and fusion T10-12 on 2/8.  Clinical Impression  Pt reporting his R LE moving a "bit" better.  Sensation is still quite limited.   Emphasis on warm up of LE's/ assisting movement at R LE, transition rolling L and up via L elbow to sitting, sitting balance, sit to stand into the RW and STEDY for transfer to the chair.  Pt became noticeably orthostatic confirmed by BP.     Follow Up Recommendations CIR    Equipment Recommendations  None recommended by PT    Recommendations for Other Services       Precautions / Restrictions Precautions Precautions: Fall;Back Precaution Booklet Issued: Yes (comment) Precaution Comments: Reviewed back precautions. Cues required for adherence during bed mobility and ADLs. Paraplegia Required Braces or Orthoses: Spinal Brace Spinal Brace: Thoracolumbosacral orthotic;Applied in supine position      Mobility  Bed Mobility Overal bed mobility: Needs Assistance Bed Mobility: Rolling;Sidelying to Sit Rolling: Mod assist Sidelying to sit: Mod assist;+2 for physical assistance       General bed mobility comments: cues for technique and sequencing.  truncal assist for roll and up from L UE, stability until pt able to use L UE appropriately to push up.    Transfers Overall transfer level: Needs assistance Equipment used: Rolling walker (2 wheeled)   Sit to Stand: Mod assist;+2 physical assistance         General transfer comment: cues for hand  placement/direction.  assist forward and boost into RW and STEDY.  Transfer to the chair with STEDY  Ambulation/Gait                Stairs            Wheelchair Mobility    Modified Rankin (Stroke Patients Only)       Balance Overall balance assessment: Needs assistance Sitting-balance support: Single extremity supported;No upper extremity supported;Feet supported Sitting balance-Leahy Scale: Fair Sitting balance - Comments: pt able to use bil UE/hands to help with donning the brace, but unable to manage brace without assist yet.   Standing balance support: Bilateral upper extremity supported;During functional activity Standing balance-Leahy Scale: Poor Standing balance comment: needed external support and AD for standing.  Worked on w/shift, attempted unweighting and moving R LE without success.                             Pertinent Vitals/Pain Pain Assessment: Faces Faces Pain Scale: Hurts even more Pain Location:  (back) Pain Descriptors / Indicators: Grimacing;Guarding;Sore Pain Intervention(s): Limited activity within patient's tolerance;Monitored during session;Repositioned    Home Living                        Prior Function                 Hand Dominance        Extremity/Trunk Assessment                Communication      Cognition  Arousal/Alertness: Awake/alert Behavior During Therapy: WFL for tasks assessed/performed Overall Cognitive Status:  (NT formally, but functional for therapy. slower processing.)                                        General Comments General comments (skin integrity, edema, etc.): Pt became noticeably orthostatic on standing.  BP's  8AM  160/69 (97), 12Noon   in sitting  106/56 (72), back sitting after transfer  89/55 (66), 115/62 (78)  reclined.    Exercises Other Exercises Other Exercises: Warm up bil LE hip/knee ROM, AA/PROM on the R LE, Other Exercises: A/P  x15, and instructions for 20-30 rep every hour or 2.   Assessment/Plan    PT Assessment    PT Problem List         PT Treatment Interventions      PT Goals (Current goals can be found in the Care Plan section)  Acute Rehab PT Goals Patient Stated Goal: To have a bowel movement. PT Goal Formulation: With patient Time For Goal Achievement: 05/11/20 Potential to Achieve Goals: Good    Frequency Min 4X/week   Barriers to discharge        Co-evaluation               AM-PAC PT "6 Clicks" Mobility  Outcome Measure Help needed turning from your back to your side while in a flat bed without using bedrails?: A Lot Help needed moving from lying on your back to sitting on the side of a flat bed without using bedrails?: A Lot Help needed moving to and from a bed to a chair (including a wheelchair)?: A Lot Help needed standing up from a chair using your arms (e.g., wheelchair or bedside chair)?: A Lot Help needed to walk in hospital room?: Total Help needed climbing 3-5 steps with a railing? : Total 6 Click Score: 10    End of Session   Activity Tolerance: Patient limited by fatigue;No increased pain Patient left: in chair;with call bell/phone within reach;with nursing/sitter in room Nurse Communication: Mobility status PT Visit Diagnosis: Other abnormalities of gait and mobility (R26.89);Difficulty in walking, not elsewhere classified (R26.2);Muscle weakness (generalized) (M62.81)    Time: 0347-4259 PT Time Calculation (min) (ACUTE ONLY): 51 min   Charges:   PT Evaluation $PT Re-evaluation: 1 Re-eval PT Treatments $Therapeutic Activity: 8-22 mins        05/03/2020  Jacinto Halim., PT Acute Rehabilitation Services [phone]  (pager) [phone]  (office)  Timothy Ware 05/03/2020, 1:36 PM

## 2020-05-03 NOTE — Anesthesia Postprocedure Evaluation (Signed)
Anesthesia Post Note  Patient: Timothy Ware  Procedure(s) Performed: Right transpedicular decompression/microdiscectomy at Thoracic ten-eleven with fixation Thoracic ten to Thoracic twelve (Right Back)     Patient location during evaluation: PACU Anesthesia Type: General Level of consciousness: sedated and patient cooperative Pain management: pain level controlled Vital Signs Assessment: post-procedure vital signs reviewed and stable Respiratory status: spontaneous breathing Cardiovascular status: stable Anesthetic complications: no   No complications documented.  Last Vitals:  Vitals:   05/03/20 0300 05/03/20 0754  BP: (!) 132/54 (!) 160/69  Pulse: (!) 58 77  Resp: 12 15  Temp: 36.6 C 36.4 C  SpO2: 100% 100%    Last Pain:  Vitals:   05/03/20 0754  TempSrc: Oral  PainSc:                  Lewie Loron
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
## 2020-05-03 NOTE — Care Management Important Message (Signed)
Important Message  Patient Details  Name: Timothy Ware MRN: [id number] Date of Birth: 1934/05/13   Medicare Important Message Given:  Yes     [name] 05/03/2020, 3:14 PM

## 2020-05-03 NOTE — Progress Notes (Signed)
Rehab Admissions Coordinator Note:  Patient was screened by Clois Dupes for appropriateness for an Inpatient Acute Rehab Consult per therapy recs postoperatively. .  At this time, we are recommending Inpatient Rehab consult. I will place order per protocol.  Clois Dupes RN MSN 05/03/2020, 1:56 PM  I can be reached at [phone].

## 2020-05-04 LAB — GLUCOSE, CAPILLARY: Glucose-Capillary: 118 mg/dL — ABNORMAL HIGH (ref 70–99)

## 2020-05-04 NOTE — Plan of Care (Signed)
Problem: Safety: Goal: Ability to remain free from injury will improve Outcome: Progressing   Problem: Education: Goal: Knowledge of General Education information will improve Description: Including pain rating scale, medication(s)/side effects and non-pharmacologic comfort measures Outcome: Progressing   Problem: Health Behavior/Discharge Planning: Goal: Ability to manage health-related needs will improve Outcome: Progressing   Problem: Clinical Measurements: Goal: Ability to maintain clinical measurements within normal limits will improve Outcome: Progressing Goal: Will remain free from infection Outcome: Progressing Goal: Diagnostic test results will improve Outcome: Progressing Goal: Respiratory complications will improve Outcome: Progressing Goal: Cardiovascular complication will be avoided Outcome: Progressing   Problem: Activity: Goal: Risk for activity intolerance will decrease Outcome: Progressing   Problem: Nutrition: Goal: Adequate nutrition will be maintained Outcome: Progressing   Problem: Coping: Goal: Level of anxiety will decrease Outcome: Progressing   Problem: Elimination: Goal: Will not experience complications related to bowel motility Outcome: Progressing Goal: Will not experience complications related to urinary retention Outcome: Progressing   Problem: Pain Managment: Goal: General experience of comfort will improve Outcome: Progressing   Problem: Safety: Goal: Ability to remain free from injury will improve Outcome: Progressing   Problem: Skin Integrity: Goal: Risk for impaired skin integrity will decrease Outcome: Progressing   Problem: Education: Goal: Knowledge of General Education information will improve Description: Including pain rating scale, medication(s)/side effects and non-pharmacologic comfort measures Outcome: Progressing   Problem: Health Behavior/Discharge Planning: Goal: Ability to manage health-related needs will  improve Outcome: Progressing   Problem: Clinical Measurements: Goal: Ability to maintain clinical measurements within normal limits will improve Outcome: Progressing Goal: Will remain free from infection Outcome: Progressing Goal: Diagnostic test results will improve Outcome: Progressing Goal: Respiratory complications will improve Outcome: Progressing Goal: Cardiovascular complication will be avoided Outcome: Progressing   Problem: Activity: Goal: Risk for activity intolerance will decrease Outcome: Progressing   Problem: Nutrition: Goal: Adequate nutrition will be maintained Outcome: Progressing   Problem: Coping: Goal: Level of anxiety will decrease Outcome: Progressing   Problem: Elimination: Goal: Will not experience complications related to bowel motility Outcome: Progressing Goal: Will not experience complications related to urinary retention Outcome: Progressing   Problem: Pain Managment: Goal: General experience of comfort will improve Outcome: Progressing   Problem: Safety: Goal: Ability to remain free from injury will improve Outcome: Progressing   Problem: Skin Integrity: Goal: Risk for impaired skin integrity will decrease Outcome: Progressing   Problem: Education: Goal: Knowledge of General Education information will improve Description: Including pain rating scale, medication(s)/side effects and non-pharmacologic comfort measures Outcome: Progressing   Problem: Health Behavior/Discharge Planning: Goal: Ability to manage health-related needs will improve Outcome: Progressing   Problem: Clinical Measurements: Goal: Ability to maintain clinical measurements within normal limits will improve Outcome: Progressing Goal: Will remain free from infection Outcome: Progressing Goal: Diagnostic test results will improve Outcome: Progressing Goal: Respiratory complications will improve Outcome: Progressing Goal: Cardiovascular complication will be  avoided Outcome: Progressing   Problem: Activity: Goal: Risk for activity intolerance will decrease Outcome: Progressing   Problem: Nutrition: Goal: Adequate nutrition will be maintained Outcome: Progressing   Problem: Coping: Goal: Level of anxiety will decrease Outcome: Progressing   Problem: Elimination: Goal: Will not experience complications related to bowel motility Outcome: Progressing Goal: Will not experience complications related to urinary retention Outcome: Progressing   Problem: Pain Managment: Goal: General experience of comfort will improve Outcome: Progressing   Problem: Safety: Goal: Ability to remain free from injury will improve Outcome: Progressing   Problem: Skin Integrity: Goal: Risk for impaired   skin integrity will decrease Outcome: Progressing

## 2020-05-04 NOTE — Progress Notes (Signed)
Physical Therapy Treatment Patient Details Name: Timothy Ware MRN: [id number] DOB: 09-29-1934 Today's Date: 05/04/2020    History of Present Illness 86 yo male with PMH of HTN, degenerative joint disease, CKD stage IIIa, back pain with radiculopathy, s/p transpedicular microdiscectomy T10/11 in early December presenting to ED from home on 2/1 with low back pain, increasing RLE numbness and weakness, and difficulty with urination. Pt being treated for UTI, pt s/p transpedicular decompression T10-11 and fusion T10-12 on 2/8.    PT Comments    Pt progressing slowly toward goals.  Noted improved movement R LE.  Extremities still low sensory on the R and decreased on the L LE.  Emphasis on rolling to work on [employer], transition, sit to stands x4 in the STEDY before transfer to chair to benefit from sitting tolerance.   Follow Up Recommendations  CIR     Equipment Recommendations  None recommended by PT;Other (comment) (TBD)    Recommendations for Other Services       Precautions / Restrictions Precautions Precautions: Fall;Back Precaution Comments: Reviewed back precautions. Cues required for adherence during bed mobility and ADLs. Paraplegia Required Braces or Orthoses: Spinal Brace Spinal Brace: Thoracolumbosacral orthotic;Applied in sitting position    Mobility  Bed Mobility Overal bed mobility: Needs Assistance Bed Mobility: Rolling;Sidelying to Sit Rolling: Min assist (x6 working on [employer] with less use of the rail) Sidelying to sit: Mod assist       General bed mobility comments: cues for technique and sequencing.  truncal assist for roll and up from L UE, stability until pt able to use L UE appropriately to push up.    Transfers Overall transfer level: Needs assistance     Sit to Stand: Min assist;Mod assist (x4)         General transfer comment: cues for hand placement, L UE push off of the bed.  Ambulation/Gait                  Stairs             Wheelchair Mobility    Modified Rankin (Stroke Patients Only)       Balance Overall balance assessment: Needs assistance Sitting-balance support: No upper extremity supported Sitting balance-Leahy Scale: Fair Sitting balance - Comments: pt able to use bil UE/hands to help even more with donning the brace. still needing minimal assist due to bad L shoulder   Standing balance support: Bilateral upper extremity supported;During functional activity Standing balance-Leahy Scale: Poor Standing balance comment: needed external support and AD for standing.  Worked on w/shift, attempted unweighting.  Emphasis on R knee controll with feedback.                            Cognition Arousal/Alertness: Awake/alert Behavior During Therapy: WFL for tasks assessed/performed Overall Cognitive Status:  (NT formally)                                 General Comments: followed commands and participated appropriately      Exercises      General Comments        Pertinent Vitals/Pain Pain Assessment: Faces Faces Pain Scale: Hurts even more Pain Location: back Pain Descriptors / Indicators: Grimacing;Guarding;Sore Pain Intervention(s): Monitored during session;Premedicated before session    Home Living  Prior Function            PT Goals (current goals can now be found in the care plan section) Acute Rehab PT Goals PT Goal Formulation: With patient Time For Goal Achievement: 05/11/20 Potential to Achieve Goals: Good Progress towards PT goals: Progressing toward goals    Frequency    Min 4X/week      PT Plan Current plan remains appropriate    Co-evaluation              AM-PAC PT "6 Clicks" Mobility   Outcome Measure  Help needed turning from your back to your side while in a flat bed without using bedrails?: A Lot Help needed moving from lying on your back to sitting on the side of  a flat bed without using bedrails?: A Lot Help needed moving to and from a bed to a chair (including a wheelchair)?: A Lot Help needed standing up from a chair using your arms (e.g., wheelchair or bedside chair)?: A Lot Help needed to walk in hospital room?: Total Help needed climbing 3-5 steps with a railing? : Total 6 Click Score: 10    End of Session   Activity Tolerance: Patient limited by fatigue;No increased pain Patient left: in chair;with call bell/phone within reach;with nursing/sitter in room Nurse Communication: Mobility status PT Visit Diagnosis: Other abnormalities of gait and mobility (R26.89);Difficulty in walking, not elsewhere classified (R26.2);Muscle weakness (generalized) (M62.81)     Time: 4097-3532 PT Time Calculation (min) (ACUTE ONLY): 43 min  Charges:  $Therapeutic Activity: 38-52 mins                     05/04/2020  Jacinto Halim., PT Acute Rehabilitation Services [phone]  (pager) [phone]  (office)   [name] 05/04/2020, 6:30 PM

## 2020-05-04 NOTE — Progress Notes (Signed)
Inpatient Rehabilitation-Admissions Coordinator '  Confirmed DC support with pt's wife and confirmed continued interest in CIR program with the pt. I don't have a bed available for this patient in CIR today. Will continue to follow for possible admit, pending bed availability.   Cheri Rous, OTR/L  Rehab Admissions Coordinator  [phone] 05/04/2020 3:08 PM

## 2020-05-04 NOTE — Progress Notes (Signed)
Subjective: Patient reports mild incisional discomfort around the superior portion of his incision. Otherwise he is doing well and has no other complaints. No acute events overnight.   Objective: Vital signs in last 24 hours: Temp:  [97.6 F (36.4 C)-98.1 F (36.7 C)] 97.6 F (36.4 C) (02/10 0752) Pulse Rate:  [63-77] 77 (02/10 0752) Resp:  [14-16] 14 (02/10 0752) BP: (105-139)/(54-58) 138/54 (02/10 0752) SpO2:  [98 %-100 %] 99 % (02/10 0752)  Intake/Output from previous day: 02/09 0701 - 02/10 0700 In: 1485 [P.O.:400; I.V.:1085] Out: 2640 [Urine:2475; Drains:165] Intake/Output this shift: Total I/O In: 350 [P.O.:350] Out: 400 [Urine:400]  Physical Exam: Patient is awake, A/O X 4, conversant, and in good spirits. Doing well overall. Speech is fluent and appropriate. MAEW. RLE strength has improved. Dressing is clean dry intact. Incision is well approximated with no drainage, erythema, or edema. Drain with 00 ml of output overnight.    Lab Results: Recent Labs    05/01/20 1606  WBC 8.7  HGB 11.6*  HCT 36.9*  PLT 308   BMET Recent Labs    05/01/20 1606 05/03/20 0042  NA 135 131*  K 4.0 4.6  CL 97* 98  CO2 27 22  GLUCOSE 111* 149*  BUN 17 20  CREATININE 1.26* 1.05  CALCIUM 8.5* 8.3*    Studies/Results: DG Thoracic Spine 2 View  Result Date: 05/02/2020 CLINICAL DATA:  Surgery, elective. Additional history provided: Surgery: Right transpedicular decompression/micro discectomy at thoracic 10-11 with fixation thoracic 10 to thoracic 12. Provided fluoroscopy time 20 seconds (11.18 mGy). EXAM: THORACIC SPINE 2 VIEWS; DG C-ARM 1-60 MIN COMPARISON:  MRI of the thoracic spine 04/26/2020. FINDINGS: PA and lateral view intraoperative fluoroscopic images of the thoracic spine are submitted, 3 images total. The images demonstrate bilateral pedicle screws at what are presumed to be the T10 and T12 levels. Vertical interconnecting rods were not present at the time the images were  taken. Overlying retractors. IMPRESSION: 3 intraoperative fluoroscopic images of the thoracic spine, as described. Electronically Signed   By: Jackey Loge DO   On: 05/02/2020 17:10   DG C-Arm 1-60 Min  Result Date: 05/02/2020 CLINICAL DATA:  Surgery, elective. Additional history provided: Surgery: Right transpedicular decompression/micro discectomy at thoracic 10-11 with fixation thoracic 10 to thoracic 12. Provided fluoroscopy time 20 seconds (11.18 mGy). EXAM: THORACIC SPINE 2 VIEWS; DG C-ARM 1-60 MIN COMPARISON:  MRI of the thoracic spine 04/26/2020. FINDINGS: PA and lateral view intraoperative fluoroscopic images of the thoracic spine are submitted, 3 images total. The images demonstrate bilateral pedicle screws at what are presumed to be the T10 and T12 levels. Vertical interconnecting rods were not present at the time the images were taken. Overlying retractors. IMPRESSION: 3 intraoperative fluoroscopic images of the thoracic spine, as described. Electronically Signed   By: Jackey Loge DO   On: 05/02/2020 17:10    Assessment/Plan: Patient is post-op day 2 s/p right transpedicular decompression/microdiscectomy at T10/11 with pedicle screw fixation T10/12 with posterolateral arthrodesis. He is continuing to recover well and continues to have increased strength in his lower extremities.  His only complaint is mild incisional discomfort that is well controlled on PO analgesics.  TLSO brace when OOB. Continue working on pain control, mobility and ambulating patient. Will leave drain in for today. Plan for CIR at discharge.     LOS: 8 days     Council Mechanic, DNP, NP-C 05/04/2020, 8:38 AM

## 2020-05-05 LAB — BASIC METABOLIC PANEL
Anion gap: 10 (ref 5–15)
BUN: 24 mg/dL — ABNORMAL HIGH (ref 8–23)
CO2: 23 mmol/L (ref 22–32)
Calcium: 8.2 mg/dL — ABNORMAL LOW (ref 8.9–10.3)
Chloride: 102 mmol/L (ref 98–111)
Creatinine, Ser: 1.13 mg/dL (ref 0.61–1.24)
GFR, Estimated: >60 mL/min (ref >60)
Glucose, Bld: 95 mg/dL (ref 70–99)
Potassium: 4.4 mmol/L (ref 3.5–5.1)
Sodium: 135 mmol/L (ref 135–145)

## 2020-05-05 LAB — GLUCOSE, CAPILLARY: Glucose-Capillary: 111 mg/dL — ABNORMAL HIGH (ref 70–99)
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
## 2020-05-05 NOTE — Progress Notes (Signed)
Inpatient Rehab Admissions Coordinator:  There is a bed available for pt to admit to CIR Saturday, February 12th.  Dr. Venetia Maxon aware and in agreement.  Dr. Riley Kill will assess pt in the morning for medical appropriateness.  Floor nurse can contact 4W nurses station after noon at [phone].   Wolfgang Phoenix, MS, CCC-SLP Admissions Coordinator [phone]

## 2020-05-05 NOTE — Progress Notes (Signed)
Pharmacy Antibiotic Note  Timothy Ware is a 86 y.o. male admitted on 04/25/2020 with surgical prophylaxis.  Pharmacy has been consulted for vancomycin dosing.  Underwent R transpedicular decompression/microdiscectomy at Thoracic 10-11 with pedicle screw fixation, 10-12 with posterolateral arthrodesis on 2/8. Has drain in place post-op - plan to remove today.  Scr stable 1.13 (CrCl 56 mL/min). Afebrile.   Plan:  Continue vancomycin 1250 mg IV every 24 hours  *Plan to remove drain today - f/up antibiotic plan post removal of drain Monitor renal fx, clinical pic, and duration of therapy   Height: 6\' 1"  (185.4 cm) Weight: 80 kg (176 lb 5.9 oz) IBW/kg (Calculated) : 79.9  Temp (24hrs), Avg:98.3 F (36.8 C), Min:97.8 F (36.6 C), Max:99 F (37.2 C)  Recent Labs  Lab 04/29/20 0459 05/01/20 1606 05/03/20 0042 05/05/20 0357  WBC 8.0 8.7  --   --   CREATININE 1.23 1.26* 1.05 1.13    Estimated Creatinine Clearance: 56 mL/min (by C-G formula based on SCr of 1.13 mg/dL).    Allergies  Allergen Reactions  . Penicillins Hives  . Oxycodone Other (See Comments)    confusion    Antimicrobials this admission: Vancomycin  2/8 >>   Dose adjustments this admission: N/A  Microbiology results: 2/2 UCx: staph epi 2/4 UCx: ng  2/8 MRSA PCR: neg  Thank you for allowing pharmacy to be a part of this patient's care.  4/8, PharmD, BCPS, BCCCP Clinical Pharmacist 05/05/2020 10:38 AM  Please check AMION for all [facility] phone numbers After 10:00 PM, call Main Pharmacy [phone]

## 2020-05-05 NOTE — Plan of Care (Signed)
Problem: Safety: Goal: Ability to remain free from injury will improve Outcome: Progressing   Problem: Education: Goal: Knowledge of General Education information will improve Description: Including pain rating scale, medication(s)/side effects and non-pharmacologic comfort measures Outcome: Progressing   Problem: Health Behavior/Discharge Planning: Goal: Ability to manage health-related needs will improve Outcome: Progressing   Problem: Clinical Measurements: Goal: Ability to maintain clinical measurements within normal limits will improve Outcome: Progressing Goal: Will remain free from infection Outcome: Progressing Goal: Diagnostic test results will improve Outcome: Progressing Goal: Respiratory complications will improve Outcome: Progressing Goal: Cardiovascular complication will be avoided Outcome: Progressing   Problem: Activity: Goal: Risk for activity intolerance will decrease Outcome: Progressing   Problem: Nutrition: Goal: Adequate nutrition will be maintained Outcome: Progressing   Problem: Coping: Goal: Level of anxiety will decrease Outcome: Progressing   Problem: Elimination: Goal: Will not experience complications related to bowel motility Outcome: Progressing Goal: Will not experience complications related to urinary retention Outcome: Progressing   Problem: Pain Managment: Goal: General experience of comfort will improve Outcome: Progressing   Problem: Safety: Goal: Ability to remain free from injury will improve Outcome: Progressing   Problem: Skin Integrity: Goal: Risk for impaired skin integrity will decrease Outcome: Progressing   Problem: Education: Goal: Knowledge of General Education information will improve Description: Including pain rating scale, medication(s)/side effects and non-pharmacologic comfort measures Outcome: Progressing   Problem: Health Behavior/Discharge Planning: Goal: Ability to manage health-related needs will  improve Outcome: Progressing   Problem: Clinical Measurements: Goal: Ability to maintain clinical measurements within normal limits will improve Outcome: Progressing Goal: Will remain free from infection Outcome: Progressing Goal: Diagnostic test results will improve Outcome: Progressing Goal: Respiratory complications will improve Outcome: Progressing Goal: Cardiovascular complication will be avoided Outcome: Progressing   Problem: Activity: Goal: Risk for activity intolerance will decrease Outcome: Progressing   Problem: Nutrition: Goal: Adequate nutrition will be maintained Outcome: Progressing   Problem: Coping: Goal: Level of anxiety will decrease Outcome: Progressing   Problem: Elimination: Goal: Will not experience complications related to bowel motility Outcome: Progressing Goal: Will not experience complications related to urinary retention Outcome: Progressing   Problem: Pain Managment: Goal: General experience of comfort will improve Outcome: Progressing   Problem: Safety: Goal: Ability to remain free from injury will improve Outcome: Progressing   Problem: Skin Integrity: Goal: Risk for impaired skin integrity will decrease Outcome: Progressing   Problem: Education: Goal: Knowledge of General Education information will improve Description: Including pain rating scale, medication(s)/side effects and non-pharmacologic comfort measures Outcome: Progressing   Problem: Health Behavior/Discharge Planning: Goal: Ability to manage health-related needs will improve Outcome: Progressing   Problem: Clinical Measurements: Goal: Ability to maintain clinical measurements within normal limits will improve Outcome: Progressing Goal: Will remain free from infection Outcome: Progressing Goal: Diagnostic test results will improve Outcome: Progressing Goal: Respiratory complications will improve Outcome: Progressing Goal: Cardiovascular complication will be  avoided Outcome: Progressing   Problem: Activity: Goal: Risk for activity intolerance will decrease Outcome: Progressing   Problem: Nutrition: Goal: Adequate nutrition will be maintained Outcome: Progressing   Problem: Coping: Goal: Level of anxiety will decrease Outcome: Progressing   Problem: Elimination: Goal: Will not experience complications related to bowel motility Outcome: Progressing Goal: Will not experience complications related to urinary retention Outcome: Progressing   Problem: Pain Managment: Goal: General experience of comfort will improve Outcome: Progressing   Problem: Safety: Goal: Ability to remain free from injury will improve Outcome: Progressing   Problem: Skin Integrity: Goal: Risk for impaired   skin integrity will decrease Outcome: Progressing

## 2020-05-05 NOTE — Progress Notes (Signed)
Physical Therapy Treatment Patient Details Name: Timothy Ware MRN: [id number] DOB: Oct 18, 1934 Today's Date: 05/05/2020    History of Present Illness 86 yo male with PMH of HTN, degenerative joint disease, CKD stage IIIa, back pain with radiculopathy, s/p transpedicular microdiscectomy T10/11 in early December presenting to ED from home on 2/1 with low back pain, increasing RLE numbness and weakness, and difficulty with urination. Pt being treated for UTI, pt s/p transpedicular decompression T10-11 and fusion T10-12 on 2/8.    PT Comments    The pt continues to make slow progress with PT at this time. He was able to complete multiple sit-stand transitions with use of RW and modA of 2 depending on height of bed. The pt was then able to complete multiple standing exercises to prepare for initiation of gait training with bouts of seated rest between each activity. The pt will continue to benefit from skilled PT to progress functional strength in RLE, stability, and endurance to allow for return to prior level of mobility and independence.     Follow Up Recommendations  CIR     Equipment Recommendations  None recommended by PT;Other (comment) (defer to post acute)    Recommendations for Other Services       Precautions / Restrictions Precautions Precautions: Fall;Back Precaution Comments: Reviewed back precautions. Cues required for adherence during bed mobility and ADLs. Paraplegia Required Braces or Orthoses: Spinal Brace Spinal Brace: Thoracolumbosacral orthotic;Applied in sitting position Restrictions Weight Bearing Restrictions: No    Mobility  Bed Mobility Overal bed mobility: Needs Assistance Bed Mobility: Rolling;Sidelying to Sit;Sit to Sidelying Rolling: Min guard Sidelying to sit: Min assist     Sit to sidelying: Min assist General bed mobility comments: cues for technique, pt using increased time, but able to come to sitting EOB with minA only to complete elvation of  trunk from [facility]. The pt returned to supine impulsively to "try a new way" without adhering to precautions, educated on how that was unsafe way to complete mobiltiy    Transfers Overall transfer level: Needs assistance Equipment used: Rolling walker (2 wheeled) Transfers: Sit to/from Stand Sit to Stand: Min assist;From elevated surface         General transfer comment: cues for hand placement, pt then able to perform sit-stand from elevated surface with minA of 2 from elevated bed, modA of 2 from lower bed  Ambulation/Gait             General Gait Details: pt unable to generate steps       Balance Overall balance assessment: Needs assistance Sitting-balance support: No upper extremity supported Sitting balance-Leahy Scale: Fair Sitting balance - Comments: pt able to use bil UE/hands to help even more with donning the brace. still needing minimal assist due to bad L shoulder   Standing balance support: Bilateral upper extremity supported;During functional activity Standing balance-Leahy Scale: Poor Standing balance comment: needed external support and AD for standing.  Worked on w/shift, attempted unweighting.  Emphasis on R knee controll with feedback.                            Cognition Arousal/Alertness: Awake/alert Behavior During Therapy: WFL for tasks assessed/performed Overall Cognitive Status: Impaired/Different from baseline Area of Impairment: Problem solving;Memory                     Memory: Decreased short-term memory;Decreased recall of precautions   Safety/Judgement: Decreased awareness of safety  Problem Solving: Slow processing;Requires verbal cues General Comments: followed commands through session, answered all questions, but impulsivley attempted sit-supine transition that did not follow back precautions demoing decreased memory of precautions or problem solving      Exercises Other Exercises Other Exercises: standing wt  shifts bilaterally x10 with focus on maintaining R knee extension Other Exercises: standing heel raise-toe raise x 10 each Other Exercises: seated with RLE on washcloth, performing knee extension, knee flexion, hip abduction and hip adduction with reduced friction    General Comments General comments (skin integrity, edema, etc.): VSS through session      Pertinent Vitals/Pain Pain Assessment: Faces Faces Pain Scale: Hurts little more Pain Location: back Pain Descriptors / Indicators: Grimacing;Guarding;Sore Pain Intervention(s): Limited activity within patient's tolerance;Monitored during session;Repositioned           PT Goals (current goals can now be found in the care plan section) Acute Rehab PT Goals Patient Stated Goal: get back to working on Orthoptist PT Goal Formulation: With patient Time For Goal Achievement: 05/11/20 Potential to Achieve Goals: Good Progress towards PT goals: Progressing toward goals    Frequency    Min 4X/week      PT Plan Current plan remains appropriate       AM-PAC PT "6 Clicks" Mobility   Outcome Measure  Help needed turning from your back to your side while in a flat bed without using bedrails?: A Lot Help needed moving from lying on your back to sitting on the side of a flat bed without using bedrails?: A Little Help needed moving to and from a bed to a chair (including a wheelchair)?: A Lot Help needed standing up from a chair using your arms (e.g., wheelchair or bedside chair)?: A Little Help needed to walk in hospital room?: Total Help needed climbing 3-5 steps with a railing? : Total 6 Click Score: 12    End of Session Equipment Utilized During Treatment: Gait belt Activity Tolerance: Patient tolerated treatment well Patient left: with call bell/phone within reach;with nursing/sitter in room;in bed;with bed alarm set Nurse Communication: Mobility status PT Visit Diagnosis: Other abnormalities of gait and  mobility (R26.89);Difficulty in walking, not elsewhere classified (R26.2);Muscle weakness (generalized) (M62.81)     Time: 6270-3500 PT Time Calculation (min) (ACUTE ONLY): 26 min  Charges:  $Therapeutic Exercise: 8-22 mins $Therapeutic Activity: 8-22 mins                     Rolm Baptise, PT, DPT   Acute Rehabilitation Department Pager #: [phone]   Gaetana Michaelis 05/05/2020, 5:59 PM

## 2020-05-05 NOTE — Progress Notes (Signed)
Subjective: Patient reported that he feels slightly weaker in his RLE as compared to yesterday. He notes significant discomfort with sitting in the chair yesterday. Patient continues to report mild incisional discomfort around the superior portion of his incision. No acute events overnight.   Objective: Vital signs in last 24 hours: Temp:  [97.6 F (36.4 C)-99 F (37.2 C)] 99 F (37.2 C) (02/11 0335) Pulse Rate:  [67-79] 79 (02/11 0335) Resp:  [14-20] 16 (02/11 0335) BP: (97-138)/(53-64) 120/54 (02/11 0335) SpO2:  [95 %-99 %] 99 % (02/11 0335)  Intake/Output from previous day: 02/10 0701 - 02/11 0700 In: 350 [P.O.:350] Out: 2030 [Urine:1975; Drains:55] Intake/Output this shift: No intake/output data recorded.  Physical Exam: Patient is awake, A/O X 4, conversant, and in good spirits. Speech is fluent and appropriate. MAEW. RLE strength has slightly decreased from the last two days. He was unable to move his RLE against gravity. Dorsiflexion and plantar flexion appear to be unchanged, but knee extension is weaker.Dressing is clean dry intact. Incision is well approximated with no drainage, erythema, or edema.Drain with 55 ml of output overnight.  Lab Results: No results for input(s): WBC, HGB, HCT, PLT in the last 72 hours. BMET Recent Labs    05/03/20 0042 05/05/20 0357  NA 131* 135  K 4.6 4.4  CL 98 102  CO2 22 23  GLUCOSE 149* 95  BUN 20 24*  CREATININE 1.05 1.13  CALCIUM 8.3* 8.2*    Studies/Results: No results found.  Assessment/Plan: Patient is post-op day 3 s/pright transpedicular decompression/microdiscectomy at T10/11 with pedicle screw fixation T10/12 with posterolateral arthrodesis.Heis continuing to recover well. He has had a slight decrease in his RLE strength that is likely due to irritation.He continues to experience mild incisional discomfort that is well controlled on PO analgesics.Continue working with therapies. TLSO brace when OOB. Continue  working on pain control, mobility and ambulating patient. Willremove drain today.CIR once a bed becomes available.     LOS: 9 days     Council Mechanic, DNP, NP-C 05/05/2020, 7:21 AM

## 2020-05-06 ENCOUNTER — Encounter (HOSPITAL_COMMUNITY): Payer: Self-pay | Admitting: Physical Medicine & Rehabilitation

## 2020-05-06 ENCOUNTER — Inpatient Hospital Stay (HOSPITAL_COMMUNITY)
Admission: RE | Admit: 2020-05-06 | Discharge: 2020-05-31 | DRG: 560 | Disposition: A | Discharge status: Home Health | Payer: Medicare Other | Source: Intra-hospital | Attending: Physical Medicine & Rehabilitation | Admitting: Physical Medicine & Rehabilitation

## 2020-05-06 ENCOUNTER — Other Ambulatory Visit: Payer: Self-pay

## 2020-05-06 DIAGNOSIS — K592 Neurogenic bowel, not elsewhere classified: Secondary | ICD-10-CM | POA: Diagnosis present

## 2020-05-06 DIAGNOSIS — M4714 Other spondylosis with myelopathy, thoracic region: Secondary | ICD-10-CM | POA: Diagnosis present

## 2020-05-06 DIAGNOSIS — I10 Essential (primary) hypertension: Secondary | ICD-10-CM | POA: Diagnosis not present

## 2020-05-06 DIAGNOSIS — R5381 Other malaise: Secondary | ICD-10-CM | POA: Diagnosis present

## 2020-05-06 DIAGNOSIS — Z4789 Encounter for other orthopedic aftercare: Principal | ICD-10-CM

## 2020-05-06 DIAGNOSIS — I82412 Acute embolism and thrombosis of left femoral vein: Secondary | ICD-10-CM | POA: Diagnosis present

## 2020-05-06 DIAGNOSIS — R3915 Urgency of urination: Secondary | ICD-10-CM | POA: Diagnosis present

## 2020-05-06 DIAGNOSIS — N1831 Chronic kidney disease, stage 3a: Secondary | ICD-10-CM | POA: Diagnosis present

## 2020-05-06 DIAGNOSIS — E559 Vitamin D deficiency, unspecified: Secondary | ICD-10-CM | POA: Diagnosis present

## 2020-05-06 DIAGNOSIS — D62 Acute posthemorrhagic anemia: Secondary | ICD-10-CM | POA: Diagnosis present

## 2020-05-06 DIAGNOSIS — N319 Neuromuscular dysfunction of bladder, unspecified: Secondary | ICD-10-CM | POA: Diagnosis present

## 2020-05-06 DIAGNOSIS — I959 Hypotension, unspecified: Secondary | ICD-10-CM | POA: Diagnosis not present

## 2020-05-06 DIAGNOSIS — G479 Sleep disorder, unspecified: Secondary | ICD-10-CM

## 2020-05-06 DIAGNOSIS — Z955 Presence of coronary angioplasty implant and graft: Secondary | ICD-10-CM | POA: Diagnosis not present

## 2020-05-06 DIAGNOSIS — M7989 Other specified soft tissue disorders: Secondary | ICD-10-CM | POA: Diagnosis not present

## 2020-05-06 DIAGNOSIS — I9589 Other hypotension: Secondary | ICD-10-CM | POA: Diagnosis not present

## 2020-05-06 DIAGNOSIS — Z86718 Personal history of other venous thrombosis and embolism: Secondary | ICD-10-CM

## 2020-05-06 DIAGNOSIS — Z79899 Other long term (current) drug therapy: Secondary | ICD-10-CM | POA: Diagnosis not present

## 2020-05-06 DIAGNOSIS — M62838 Other muscle spasm: Secondary | ICD-10-CM | POA: Diagnosis not present

## 2020-05-06 DIAGNOSIS — I129 Hypertensive chronic kidney disease with stage 1 through stage 4 chronic kidney disease, or unspecified chronic kidney disease: Secondary | ICD-10-CM | POA: Diagnosis present

## 2020-05-06 DIAGNOSIS — K5900 Constipation, unspecified: Secondary | ICD-10-CM | POA: Diagnosis present

## 2020-05-06 DIAGNOSIS — E785 Hyperlipidemia, unspecified: Secondary | ICD-10-CM | POA: Diagnosis present

## 2020-05-06 DIAGNOSIS — G8220 Paraplegia, unspecified: Secondary | ICD-10-CM | POA: Diagnosis present

## 2020-05-06 DIAGNOSIS — I82432 Acute embolism and thrombosis of left popliteal vein: Secondary | ICD-10-CM | POA: Diagnosis present

## 2020-05-06 DIAGNOSIS — I2510 Atherosclerotic heart disease of native coronary artery without angina pectoris: Secondary | ICD-10-CM | POA: Diagnosis present

## 2020-05-06 DIAGNOSIS — R112 Nausea with vomiting, unspecified: Secondary | ICD-10-CM | POA: Diagnosis not present

## 2020-05-06 LAB — CBC
HCT: 30.8 % — ABNORMAL LOW (ref 39.0–52.0)
Hemoglobin: 10.1 g/dL — ABNORMAL LOW (ref 13.0–17.0)
MCH: 31.1 pg (ref 26.0–34.0)
MCHC: 32.8 g/dL (ref 30.0–36.0)
MCV: 94.8 fL (ref 80.0–100.0)
Platelets: 281 10*3/uL (ref 150–400)
RBC: 3.25 MIL/uL — ABNORMAL LOW (ref 4.22–5.81)
RDW: 13.4 % (ref 11.5–15.5)
WBC: 9.5 10*3/uL (ref 4.0–10.5)
nRBC: 0 % (ref 0.0–0.2)

## 2020-05-06 LAB — CREATININE, SERUM
Creatinine, Ser: 1.06 mg/dL (ref 0.61–1.24)
GFR, Estimated: >60 mL/min (ref >60)

## 2020-05-06 MED ORDER — BACLOFEN 10 MG PO TABS
10.0000 mg | ORAL_TABLET | Freq: Three times a day (TID) | ORAL | Status: DC | PRN
  Administered 2020-05-07 – 2020-05-23 (×13): 10 mg via ORAL
  Filled 2020-05-06 (×14): qty 1

## 2020-05-06 MED ORDER — ACETAMINOPHEN 325 MG PO TABS
650.0000 mg | ORAL_TABLET | ORAL | Status: DC | PRN
Start: 2020-05-06 — End: 2020-05-31
  Administered 2020-05-07 – 2020-05-09 (×3): 650 mg via ORAL
  Filled 2020-05-06 (×3): qty 2

## 2020-05-06 MED ORDER — ATENOLOL 25 MG PO TABS
25.0000 mg | ORAL_TABLET | Freq: Every day | ORAL | Status: DC
  Administered 2020-05-07 – 2020-05-14 (×8): 25 mg via ORAL
  Filled 2020-05-06 (×8): qty 1

## 2020-05-06 MED ORDER — ONDANSETRON HCL 4 MG/2ML IJ SOLN
4.0000 mg | Freq: Four times a day (QID) | INTRAMUSCULAR | Status: DC | PRN
Start: 2020-05-06 — End: 2020-05-31

## 2020-05-06 MED ORDER — VITAMIN D 25 MCG (1000 UNIT) PO TABS
2000.0000 [IU] | ORAL_TABLET | Freq: Every day | ORAL | Status: DC
  Administered 2020-05-07 – 2020-05-31 (×25): 2000 [IU] via ORAL
  Filled 2020-05-06 (×25): qty 2

## 2020-05-06 MED ORDER — PANTOPRAZOLE SODIUM 40 MG PO TBEC
40.0000 mg | DELAYED_RELEASE_TABLET | Freq: Every day | ORAL | Status: DC
  Administered 2020-05-06 – 2020-05-30 (×25): 40 mg via ORAL
  Filled 2020-05-06 (×25): qty 1

## 2020-05-06 MED ORDER — ACETAMINOPHEN 650 MG RE SUPP: 650.0000 mg | RECTAL | Status: DC | PRN

## 2020-05-06 MED ORDER — NAPROXEN SODIUM 275 MG PO TABS
275.0000 mg | ORAL_TABLET | Freq: Every day | ORAL | Status: DC | PRN
  Filled 2020-05-06: qty 1

## 2020-05-06 MED ORDER — ENOXAPARIN SODIUM 40 MG/0.4ML ~~LOC~~ SOLN
40.0000 mg | SUBCUTANEOUS | Status: DC
  Administered 2020-05-06 – 2020-05-07 (×2): 40 mg via SUBCUTANEOUS
  Filled 2020-05-06 (×2): qty 0.4

## 2020-05-06 MED ORDER — ATORVASTATIN CALCIUM 10 MG PO TABS
20.0000 mg | ORAL_TABLET | Freq: Every day | ORAL | Status: DC
  Administered 2020-05-07 – 2020-05-31 (×25): 20 mg via ORAL
  Filled 2020-05-06 (×25): qty 2

## 2020-05-06 MED ORDER — B COMPLEX-C PO TABS
1.0000 | ORAL_TABLET | Freq: Every day | ORAL | Status: DC
  Administered 2020-05-07 – 2020-05-31 (×25): 1 via ORAL
  Filled 2020-05-06 (×26): qty 1

## 2020-05-06 MED ORDER — ONDANSETRON HCL 4 MG PO TABS: 4.0000 mg | ORAL_TABLET | Freq: Four times a day (QID) | ORAL | 0 refills | Status: DC | PRN

## 2020-05-06 MED ORDER — MAGNESIUM OXIDE 400 (241.3 MG) MG PO TABS
400.0000 mg | ORAL_TABLET | Freq: Every day | ORAL | Status: DC
  Administered 2020-05-07 – 2020-05-31 (×25): 400 mg via ORAL
  Filled 2020-05-06 (×25): qty 1

## 2020-05-06 MED ORDER — MENTHOL 3 MG MT LOZG: 1.0000 | LOZENGE | OROMUCOSAL | 12 refills | Status: DC | PRN

## 2020-05-06 MED ORDER — SENNOSIDES-DOCUSATE SODIUM 8.6-50 MG PO TABS
2.0000 | ORAL_TABLET | Freq: Two times a day (BID) | ORAL | Status: DC
  Administered 2020-05-06 – 2020-05-31 (×48): 2 via ORAL
  Filled 2020-05-06 (×49): qty 2

## 2020-05-06 MED ORDER — ALUM & MAG HYDROXIDE-SIMETH 200-200-20 MG/5ML PO SUSP
30.0000 mL | Freq: Four times a day (QID) | ORAL | Status: DC | PRN
  Administered 2020-05-07 – 2020-05-29 (×4): 30 mL via ORAL
  Filled 2020-05-06 (×5): qty 30

## 2020-05-06 MED ORDER — ENOXAPARIN SODIUM 40 MG/0.4ML ~~LOC~~ SOLN: 40.0000 mg | SUBCUTANEOUS | Status: DC

## 2020-05-06 MED ORDER — BISACODYL 5 MG PO TBEC: 5.0000 mg | DELAYED_RELEASE_TABLET | Freq: Every day | ORAL | 0 refills | Status: AC | PRN

## 2020-05-06 MED ORDER — POLYETHYLENE GLYCOL 3350 17 G PO PACK: 17.0000 g | PACK | Freq: Every day | ORAL | 0 refills | Status: AC | PRN

## 2020-05-06 MED ORDER — HYDROCODONE-ACETAMINOPHEN 5-325 MG PO TABS
1.0000 | ORAL_TABLET | ORAL | Status: DC | PRN
  Administered 2020-05-08 – 2020-05-09 (×2): 1 via ORAL
  Administered 2020-05-10: 2 via ORAL
  Administered 2020-05-11: 1 via ORAL
  Administered 2020-05-11: 2 via ORAL
  Administered 2020-05-12 – 2020-05-13 (×2): 1 via ORAL
  Administered 2020-05-15 – 2020-05-28 (×5): 2 via ORAL
  Administered 2020-05-30: 1 via ORAL
  Filled 2020-05-06 (×3): qty 1
  Filled 2020-05-06: qty 2
  Filled 2020-05-06 (×3): qty 1
  Filled 2020-05-06 (×7): qty 2

## 2020-05-06 MED ORDER — POLYETHYLENE GLYCOL 3350 17 G PO PACK
17.0000 g | PACK | Freq: Every day | ORAL | Status: DC | PRN
  Administered 2020-05-11: 17 g via ORAL
  Filled 2020-05-06 (×2): qty 1

## 2020-05-06 MED ORDER — PROSOURCE PLUS PO LIQD
30.0000 mL | Freq: Every day | ORAL | Status: DC
  Administered 2020-05-07 – 2020-05-31 (×25): 30 mL via ORAL
  Filled 2020-05-06 (×25): qty 30

## 2020-05-06 MED ORDER — ALUM & MAG HYDROXIDE-SIMETH 200-200-20 MG/5ML PO SUSP: 30.0000 mL | Freq: Four times a day (QID) | ORAL | 0 refills | Status: DC | PRN

## 2020-05-06 MED ORDER — ONDANSETRON HCL 4 MG PO TABS
4.0000 mg | ORAL_TABLET | Freq: Four times a day (QID) | ORAL | Status: DC | PRN
  Administered 2020-05-07: 4 mg via ORAL
  Filled 2020-05-06: qty 1

## 2020-05-06 MED ORDER — BISACODYL 10 MG RE SUPP: 10.0000 mg | Freq: Every day | RECTAL | Status: DC | PRN

## 2020-05-06 MED ORDER — METHOCARBAMOL 1000 MG/10ML IJ SOLN
500.0000 mg | Freq: Four times a day (QID) | INTRAVENOUS | Status: DC | PRN
  Filled 2020-05-06: qty 5

## 2020-05-06 MED ORDER — ADULT MULTIVITAMIN W/MINERALS CH
1.0000 | ORAL_TABLET | Freq: Every day | ORAL | Status: DC
  Administered 2020-05-07 – 2020-05-31 (×25): 1 via ORAL
  Filled 2020-05-06 (×25): qty 1

## 2020-05-06 MED ORDER — METHOCARBAMOL 500 MG PO TABS
500.0000 mg | ORAL_TABLET | Freq: Four times a day (QID) | ORAL | Status: DC | PRN
Start: 2020-05-06 — End: 2020-05-31
  Administered 2020-05-07 – 2020-05-27 (×16): 500 mg via ORAL
  Filled 2020-05-06 (×16): qty 1

## 2020-05-06 MED ORDER — BISACODYL 5 MG PO TBEC
5.0000 mg | DELAYED_RELEASE_TABLET | Freq: Every day | ORAL | Status: DC | PRN
  Administered 2020-05-11: 10 mg via ORAL
  Administered 2020-05-20: 5 mg via ORAL
  Filled 2020-05-06: qty 2
  Filled 2020-05-06: qty 1
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
## 2020-05-06 NOTE — Progress Notes (Signed)
Subjective: Patient reports doing better today.  Looking forward to Rehab.  Objective: Vital signs in last 24 hours: Temp:  [98.1 F (36.7 C)-98.5 F (36.9 C)] 98.4 F (36.9 C) (02/12 0721) Pulse Rate:  [62-94] 78 (02/12 0721) Resp:  [16-18] 18 (02/12 0721) BP: (101-137)/(38-64) 137/64 (02/12 0721) SpO2:  [90 %-100 %] 98 % (02/12 0721)  Intake/Output from previous day: 02/11 0701 - 02/12 0700 In: 750 [P.O.:500; IV Piggyback:250] Out: 1845 [Urine:1775; Drains:70] Intake/Output this shift: No intake/output data recorded.  Physical Exam: Right leg strength better today than yesterday.  Better than antigravity with improved distal LE strength.  Still quite numb.  Left leg strength is good.  Dressing CDI.  Lab Results: No results for input(s): WBC, HGB, HCT, PLT in the last 72 hours. BMET Recent Labs    05/05/20 0357  NA 135  K 4.4  CL 102  CO2 23  GLUCOSE 95  BUN 24*  CREATININE 1.13  CALCIUM 8.2*    Studies/Results: No results found.  Assessment/Plan: Patient is improving.  D/C drain.  Transfer to Rehab today.    LOS: 10 days    Dorian Heckle, MD 05/06/2020, 8:01 AM
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
## 2020-05-06 NOTE — Plan of Care (Signed)
Problem: Safety: Goal: Ability to remain free from injury will improve Outcome: Progressing   Problem: Education: Goal: Knowledge of General Education information will improve Description: Including pain rating scale, medication(s)/side effects and non-pharmacologic comfort measures Outcome: Progressing   Problem: Health Behavior/Discharge Planning: Goal: Ability to manage health-related needs will improve Outcome: Progressing   Problem: Clinical Measurements: Goal: Ability to maintain clinical measurements within normal limits will improve Outcome: Progressing Goal: Will remain free from infection Outcome: Progressing Goal: Diagnostic test results will improve Outcome: Progressing Goal: Respiratory complications will improve Outcome: Progressing Goal: Cardiovascular complication will be avoided Outcome: Progressing   Problem: Activity: Goal: Risk for activity intolerance will decrease Outcome: Progressing   Problem: Nutrition: Goal: Adequate nutrition will be maintained Outcome: Progressing   Problem: Coping: Goal: Level of anxiety will decrease Outcome: Progressing   Problem: Elimination: Goal: Will not experience complications related to bowel motility Outcome: Progressing Goal: Will not experience complications related to urinary retention Outcome: Progressing   Problem: Pain Managment: Goal: General experience of comfort will improve Outcome: Progressing   Problem: Safety: Goal: Ability to remain free from injury will improve Outcome: Progressing   Problem: Skin Integrity: Goal: Risk for impaired skin integrity will decrease Outcome: Progressing   Problem: Education: Goal: Knowledge of General Education information will improve Description: Including pain rating scale, medication(s)/side effects and non-pharmacologic comfort measures Outcome: Progressing   Problem: Health Behavior/Discharge Planning: Goal: Ability to manage health-related needs will  improve Outcome: Progressing   Problem: Clinical Measurements: Goal: Ability to maintain clinical measurements within normal limits will improve Outcome: Progressing Goal: Will remain free from infection Outcome: Progressing Goal: Diagnostic test results will improve Outcome: Progressing Goal: Respiratory complications will improve Outcome: Progressing Goal: Cardiovascular complication will be avoided Outcome: Progressing   Problem: Activity: Goal: Risk for activity intolerance will decrease Outcome: Progressing   Problem: Nutrition: Goal: Adequate nutrition will be maintained Outcome: Progressing   Problem: Coping: Goal: Level of anxiety will decrease Outcome: Progressing   Problem: Elimination: Goal: Will not experience complications related to bowel motility Outcome: Progressing Goal: Will not experience complications related to urinary retention Outcome: Progressing   Problem: Pain Managment: Goal: General experience of comfort will improve Outcome: Progressing   Problem: Safety: Goal: Ability to remain free from injury will improve Outcome: Progressing   Problem: Skin Integrity: Goal: Risk for impaired skin integrity will decrease Outcome: Progressing   Problem: Education: Goal: Knowledge of General Education information will improve Description: Including pain rating scale, medication(s)/side effects and non-pharmacologic comfort measures Outcome: Progressing   Problem: Health Behavior/Discharge Planning: Goal: Ability to manage health-related needs will improve Outcome: Progressing   Problem: Clinical Measurements: Goal: Ability to maintain clinical measurements within normal limits will improve Outcome: Progressing Goal: Will remain free from infection Outcome: Progressing Goal: Diagnostic test results will improve Outcome: Progressing Goal: Respiratory complications will improve Outcome: Progressing Goal: Cardiovascular complication will be  avoided Outcome: Progressing   Problem: Activity: Goal: Risk for activity intolerance will decrease Outcome: Progressing   Problem: Nutrition: Goal: Adequate nutrition will be maintained Outcome: Progressing   Problem: Coping: Goal: Level of anxiety will decrease Outcome: Progressing   Problem: Elimination: Goal: Will not experience complications related to bowel motility Outcome: Progressing Goal: Will not experience complications related to urinary retention Outcome: Progressing   Problem: Pain Managment: Goal: General experience of comfort will improve Outcome: Progressing   Problem: Safety: Goal: Ability to remain free from injury will improve Outcome: Progressing   Problem: Skin Integrity: Goal: Risk for impaired  skin integrity will decrease Outcome: Progressing

## 2020-05-07 ENCOUNTER — Encounter (HOSPITAL_COMMUNITY): Payer: Medicare Other

## 2020-05-07 DIAGNOSIS — Z9889 Other specified postprocedural states: Secondary | ICD-10-CM

## 2020-05-07 DIAGNOSIS — R112 Nausea with vomiting, unspecified: Secondary | ICD-10-CM

## 2020-05-07 NOTE — Progress Notes (Signed)
PROGRESS NOTE   Subjective/Complaints: Says he's felt nauseas since last night. Had large bm at 2000. Nausea seems worse this morning. Denies abdominal pain or bloating. Didn't ask nurse anything until just a few minutes ago.  ROS: Patient denies fever, rash, sore throat, blurred vision,   cough, shortness of breath or chest pain, joint or back pain, headache, or mood change.    Objective:   No results found. Recent Labs    05/06/20 1558  WBC 9.5  HGB 10.1*  HCT 30.8*  PLT 281   Recent Labs    05/05/20 0357 05/06/20 1558  NA 135  --   K 4.4  --   CL 102  --   CO2 23  --   GLUCOSE 95  --   BUN 24*  --   CREATININE 1.13 1.06  CALCIUM 8.2*  --     Intake/Output Summary (Last 24 hours) at 05/07/2020 0926 Last data filed at 05/07/2020 0710 Gross per 24 hour  Intake 600 ml  Output 725 ml  Net -125 ml        Physical Exam: Vital Signs Blood pressure (!) 145/69, pulse 82, temperature 98.3 F (36.8 C), temperature source Oral, resp. rate 18, SpO2 97 %.  General: Alert and oriented x 3, appears uncomfortable  HEENT: Head is normocephalic, atraumatic, PERRLA, EOMI, sclera anicteric, oral mucosa pink and moist, dentition intact, ext ear canals clear,  Neck: Supple without JVD or lymphadenopathy Heart: Reg rate and rhythm. No murmurs rubs or gallops Chest: CTA bilaterally without wheezes, rales, or rhonchi; no distress Abdomen: Soft, non-tender, non-distended, bowel sounds positive. Extremities: No clubbing, cyanosis, or edema. Pulses are 2+ Psych: Pt's affect is appropriate. Pt is cooperative Skin: back incision with honeycomb dressing.  Neuro: Pt is cognitively appropriate with normal insight, memory, and awareness. Cranial nerves 2-12 are intact. Reflexes are 1+ in all 4's. Fine motor coordination is intact. No tremors. Motor function is grossly 5/5 UE. LE: 2+ RHF,KE and 3+to 4/5 R ADF/PF. LLE 3+ to 4/5 HF, KE and  4+ ADF/PF. Decreased sensation below the waist, RLE affected more than left. .  Musculoskeletal: LB TTP and tender with hip flexion.      Assessment/Plan: 1. Functional deficits which require 3+ hours per day of interdisciplinary therapy in a comprehensive inpatient rehab setting.  Physiatrist is providing close team supervision and 24 hour management of active medical problems listed below.  Physiatrist and rehab team continue to assess barriers to discharge/monitor patient progress toward functional and medical goals  Care Tool:  Bathing  Bathing activity did not occur: Refused           Bathing assist       Upper Body Dressing/Undressing Upper body dressing Upper body dressing/undressing activity did not occur (including orthotics): Refused What is the patient wearing?: (P) Button up shirt    Upper body assist      Lower Body Dressing/Undressing Lower body dressing    Lower body dressing activity did not occur: Refused What is the patient wearing?: (P) Pants     Lower body assist       Toileting Toileting Toileting Activity did not occur Press photographer  and hygiene only): N/A (no void or bm)  Toileting assist Assist for toileting: Independent     Transfers Chair/bed transfer  Transfers assist  Chair/bed transfer activity did not occur: Refused        Locomotion Ambulation   Ambulation assist              Walk 10 feet activity   Assist           Walk 50 feet activity   Assist           Walk 150 feet activity   Assist           Walk 10 feet on uneven surface  activity   Assist           Wheelchair     Assist               Wheelchair 50 feet with 2 turns activity    Assist            Wheelchair 150 feet activity     Assist          Blood pressure (!) 145/69, pulse 82, temperature 98.3 F (36.8 C), temperature source Oral, resp. rate 18, SpO2 97 %.  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 Status post right transpedicular decompression microdiscectomy T10-11 with pedicle screw fixation T10-12 with posterior lateral arthrodesis 05/02/2020. Back brace when out of bed -patient may showerwith back incision covered -ELOS/Goals: 14-17 days, goals min assist to supervision with PT/OT  -begin therapies today 2. Antithrombotics: -DVT/anticoagulation:Lovenox.   vascular study pending -antiplatelet therapy: N/A 3. Pain Management:Baclofen 10 mg 3 times daily, hydrocodone as needed. Robaxin and Anaprox as needed 4. Mood:Provide emotional support -antipsychotic agents: N/A 5. Neuropsych: This patientiscapable of making decisions on hisown behalf. 6. Skin/Wound Care: back incision clean, continue honeycomb dressing 7. Fluids/Electrolytes/Nutrition:Routine in and outs with follow-up chemistries 8. Hypertension. Tenormin 25 mg daily. Monitor with increased mobility 9. Neurogenic bowel andbladder. Check PVR x3.  -pt is having sensation of bladder filling and has been emptying on his own   -voiding continently so far. No PVR's checked yet -feels urge to defecate but struggling with sensation and knowing when he's empty.    -had continent BM evening of 2/12 -Senokot 2 tabs twice daily scheduled, MiraLAX as needed 10. Hyperlipidemia. Lipitor 11. CKD stage III. Creatinine baseline 1.27. Follow-up chemistries Monday 12. Nausea: unclear etiology  -perhaps initially d/t constipation?-->had BM last night  -hasn't taken any hydrocodone since he's been with [place].  -abdomen benign  -prn zofran IV/PO  -monitor today    LOS: 1 days A FACE TO FACE EVALUATION WAS PERFORMED  Ranelle Oyster 05/07/2020, 9:26 AM
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
## 2020-05-07 NOTE — Plan of Care (Signed)
  Problem: Consults Goal: RH SPINAL CORD INJURY PATIENT EDUCATION Description:  See Patient Education module for education specifics.  Outcome: Progressing   Problem: RH SKIN INTEGRITY Goal: RH STG SKIN FREE OF INFECTION/BREAKDOWN Description: Assess skin q shift with min assist Outcome: Progressing Goal: RH STG ABLE TO PERFORM INCISION/WOUND CARE W/ASSISTANCE Description: STG Able To Perform Incision/Wound Care With Mod I Assistance. Outcome: Progressing   Problem: RH SAFETY Goal: RH STG ADHERE TO SAFETY PRECAUTIONS W/ASSISTANCE/DEVICE Description: STG Adhere to Safety Precautions With cues /reminder assistance/Device. Outcome: Progressing Goal: RH STG DECREASED RISK OF FALL WITH ASSISTANCE Description: STG Decreased Risk of Fall With cues/reminders Assistance. Outcome: Progressing   Problem: RH PAIN MANAGEMENT Goal: RH STG PAIN MANAGED AT OR BELOW PT'S PAIN GOAL Description: <2 Outcome: Progressing   Problem: RH KNOWLEDGE DEFICIT SCI Goal: RH STG INCREASE KNOWLEDGE OF SELF CARE AFTER SCI Description: Patient will be able to complete self care and direct others for care assistance needs independently at discharge using handouts and educational materials. Outcome: Progressing   

## 2020-05-07 NOTE — Evaluation (Signed)
Physical Therapy Assessment and Plan  Patient Details  Name: Timothy Ware MRN: [id number] Date of Birth: 1934-04-27  PT Diagnosis: Difficulty walking, Impaired sensation, Low back pain, Muscle weakness and Pain in back Rehab Potential: Good ELOS: 18-21 days   Today's Date: 05/07/2020 PT Individual Time: 0930-1025 PT Individual Time Calculation (min): 55 min    Hospital Problem: Principal Problem:   Thoracic myelopathy   Past Medical History:  Past Medical History:  Diagnosis Date  . Anemia   . Back pain with radiculopathy   . Coronary artery disease   . DJD (degenerative joint disease)   . Hypertension   . PONV (postoperative nausea and vomiting)   . Vitamin D deficiency    Past Surgical History:  Past Surgical History:  Procedure Laterality Date  . BACK SURGERY    . CARDIAC CATHETERIZATION  10/11/1996   NL LM, insignificant LAD, 75% pRCA, 95% dRCA, 75% OM2, EF 72%. S/p RCA stents x2 & CX x1.  Marland Kitchen CARPAL TUNNEL RELEASE Bilateral   . CORONARY ANGIOPLASTY     RCA PTCA/stent 10/29/1994; RCA stents x2, Cx x1, [facility] 10/11/1996  . ELBOW SURGERY Left   . EYE SURGERY Bilateral    cataract  . ROTATOR CUFF REPAIR Left   . THORACIC DISCECTOMY N/A 02/25/2020   Procedure: TRANSPEDICULAR MICRODISCECTOMY THORACIC TEN THORACIC ELEVEN;  Surgeon: Erline Levine, MD;  Location[facility];  Service: Neurosurgery;  Laterality: N/A;  posterior    Assessment & Plan Clinical Impression: Patient is a 86 year old right-handed male with history of hypertension, CAD with angioplasty, CKD stage III as well as chronic back pain withmultiple back surgeries status postradiculopathy status post transpedicular microdiscectomy T10-11 early December 2021and received inpatient rehab services 02/28/2020 03/22/2020. Per chart review patient lives with spouse. Patient reported transferring bed to wheelchair drop arm bedside commode only very limited ambulation. Needing assist for ADLs. Prior to initial back surgery  in December reportedly independent. Two-level home bed bath main level. One-step to entry. Son and wife with good support. Presented 04/25/2020 with increasing right lower extremity weakness and numbness. Latest chemistries 05/01/2020 hemoglobin 11.6 hematocrit 36.9. 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 Underwent right transpedicular decompression microdiscectomy at T10-11 with pedicle screw fixation. Thoracic 10-12with posterior lateral arthrodesis 05/02/2020 per Dr. Vertell Limber. Placed on Lovenox for DVT prophylaxis. Back brace when out of bed applied in sitting position. therapy evaluations completed due to patient's decrease in functional mobility was admitted for a comprehensive rehab program.Patient transferred to CIR on 05/06/2020 .   Patient currently requires mod assist for w/c level transfers; unable to stand with mobility secondary to muscle weakness and muscle joint tightness, decreased cardiorespiratoy endurance, unbalanced muscle activation and decreased coordination and decreased sitting balance, decreased standing balance, decreased postural control, decreased balance strategies and difficulty maintaining precautions.  Prior to hospitalization, patient was supervision with mobility and lived with Spouse in a House home.  Home access is 1 (his son bumps him into the house with w/c)Stairs to enter.  Patient will benefit from skilled PT intervention to maximize safe functional mobility, minimize fall risk and decrease caregiver burden for planned discharge home with 24 hour assist.  Anticipate patient will benefit from follow up [facility] at discharge.  PT - End of Session Activity Tolerance: Decreased this session Endurance Deficit: Yes Endurance Deficit Description: pain also a limiting factor today PT Assessment Rehab Potential (ACUTE/IP ONLY): Good PT Barriers to Discharge: Home environment access/layout;Other (comments) PT  Barriers to Discharge Comments:  pain PT Patient demonstrates impairments in the following area(s): Balance;Endurance;Motor;Pain;Safety;Sensory;Skin Integrity PT Transfers Functional Problem(s): Bed Mobility;Car;Bed to Chair;Furniture PT Locomotion Functional Problem(s): Wheelchair Mobility;Ambulation;Stairs PT Plan PT Intensity: Minimum of 1-2 x/day ,45 to 90 minutes PT Frequency: 5 out of 7 days PT Duration Estimated Length of Stay: 18-21 days PT Treatment/Interventions: Ambulation/gait training;Balance/vestibular training;Community reintegration;Discharge planning;Disease management/prevention;DME/adaptive equipment instruction;Functional mobility training;Neuromuscular re-education;Pain management;Patient/family education;Psychosocial support;Skin care/wound management;Functional electrical stimulation;Splinting/orthotics;Stair training;Therapeutic Activities;Therapeutic Exercise;UE/LE Strength taining/ROM;UE/LE Coordination activities;Wheelchair propulsion/positioning PT Transfers Anticipated Outcome(s): supervision w/c level transfers; min assist car transfer PT Locomotion Anticipated Outcome(s): mod I w/c mobility PT Recommendation Recommendations for Other Services: Therapeutic Recreation consult Therapeutic Recreation Interventions: Stress management Follow Up Recommendations: Home health PT;24 hour supervision/assistance Patient destination: Home Equipment Details: has w/c, RW, and hospital bed   PT Evaluation Precautions/Restrictions Precautions Precautions: Fall;Back Required Braces or Orthoses: Spinal Brace Spinal Brace: Thoracolumbosacral orthotic;Applied in sitting position Restrictions Weight Bearing Restrictions: No Pain C/o pain in middle of back near incision. Limiting mobility tolerance during evaluation and unable to stay up OOB due to pain. Pt declines medication initially, notified RN at end of session.  Home Living/Prior Functioning Home Living Available Help at  Discharge: Family;Available 24 hours/day;Other (Comment) (wife 24/7; son intermittently) Type of Home: House Home Access: Stairs to enter [employer] of Steps: 1 (his son bumps him into the house with w/c) Entrance Stairs-Rails: Right;Left Home Layout: Able to live on main level with bedroom/bathroom Bathroom Accessibility: No Additional Comments: WC has been since December - prior to that was independent  Lives With: Spouse Prior Function Level of Independence: Requires assistive device for independence;Needs assistance with ADLs (w/c level only since Dec)  Able to Take Stairs?: No Driving: No Vision/Perception  Perception Perception: Within Functional Limits Praxis Praxis: Intact  Cognition Overall Cognitive Status: Within Functional Limits for tasks assessed Orientation Level: Oriented X4 Sensation Sensation Light Touch: Impaired Detail Light Touch Impaired Details: Impaired RLE Proprioception: Impaired by gross assessment Coordination Gross Motor Movements are Fluid and Coordinated: No Motor  Motor Motor: Abnormal postural alignment and control;Other (comment) Motor - Skilled Clinical Observations: RLE weaker than L   Trunk/Postural Assessment  Cervical Assessment Cervical Assessment: Within Functional Limits Thoracic Assessment Thoracic Assessment: Exceptions to WFL (TLSO, back precautions) Lumbar Assessment Lumbar Assessment: Exceptions to [facility] (posterior pelvic tilt) Postural Control Postural Control: Deficits on evaluation  Balance Balance Balance Assessed: Yes Static Sitting Balance Static Sitting - Level of Assistance: 5: Stand by assistance Dynamic Sitting Balance Dynamic Sitting - Level of Assistance: 4: Min assist Static Standing Balance Static Standing - Level of Assistance: Not tested (comment) (unable to get into standing) Extremity Assessment      RLE Assessment RLE Assessment: Exceptions to [facility] Passive Range of Motion (PROM)  Comments: [facility] General Strength Comments: trace hip flexors, 3-/5 knee extension and ankle DF/PF LLE Assessment LLE Assessment: Exceptions to [facility] General Strength Comments: grossly 3 to 3+/5  Care Tool Care Tool Bed Mobility Roll left and right activity   Roll left and right assist level: Minimal Assistance - Patient > 75%    Sit to lying activity   Sit to lying assist level: Minimal Assistance - Patient > 75%    Lying to sitting edge of bed activity   Lying to sitting edge of bed assist level: Minimal Assistance - Patient > 75%     Care Tool Transfers Sit to stand transfer   Sit to stand assist level: Total Assistance - Patient < 25%    Chair/bed transfer Chair/bed  transfer activity did not occur: Refused Chair/bed transfer assist level: Moderate Assistance - Patient 50 - 74%     Toilet transfer Toilet transfer activity did not occur: Social worker transfer activity did not occur: Safety/medical concerns (pain)        Care Tool Locomotion Ambulation Ambulation activity did not occur: Safety/medical concerns (pain and unable to stand)        Walk 10 feet activity Walk 10 feet activity did not occur: Safety/medical concerns       Walk 50 feet with 2 turns activity Walk 50 feet with 2 turns activity did not occur: Safety/medical concerns      Walk 150 feet activity Walk 150 feet activity did not occur: Safety/medical concerns      Walk 10 feet on uneven surfaces activity Walk 10 feet on uneven surfaces activity did not occur: Safety/medical concerns      Stairs Stair activity did not occur: Safety/medical concerns        Walk up/down 1 step activity Walk up/down 1 step or curb (drop down) activity did not occur: Safety/medical concerns     Walk up/down 4 steps activity did not occuR: Safety/medical concerns  Walk up/down 4 steps activity      Walk up/down 12 steps activity Walk up/down 12 steps activity did not occur: Safety/medical concerns       Pick up small objects from floor Pick up small object from the floor (from standing position) activity did not occur: Safety/medical concerns      Wheelchair Will patient use wheelchair at discharge?: Yes Type of Wheelchair: Manual   Wheelchair assist level: Supervision/Verbal cueing Max wheelchair distance: 150  Wheel 50 feet with 2 turns activity   Assist Level: Supervision/Verbal cueing  Wheel 150 feet activity   Assist Level: Supervision/Verbal cueing    Refer to Care Plan for Long Term Goals  SHORT TERM GOAL WEEK 1 PT Short Term Goal 1 (Week 1): Pt will be able to perform min assist squat pivot transfers bed <> w/c PT Short Term Goal 2 (Week 1): Pt will be able to perform sit <> stand with max assist PT Short Term Goal 3 (Week 1): Pt will be able to tolerate OOB in chair x 2 hours a day  Recommendations for other services: Therapeutic Recreation  Stress management  Skilled Therapeutic Intervention Mobility Bed Mobility Bed Mobility: Rolling Right;Supine to Sit;Sit to Supine Rolling Right: Supervision/verbal cueing Supine to Sit: Minimal Assistance - Patient > 75% Sit to Supine: Minimal Assistance - Patient > 75% Transfers Transfers: Set designer Transfers;Sit to Stand Sit to Stand: Other/comment (unable due to pain) Squat Pivot Transfers: Moderate Assistance - Patient 50-74% Locomotion  Stairs / Additional Locomotion Stairs: No Architect: Yes Wheelchair Assistance: Chartered loss adjuster: Both upper extremities Wheelchair Parts Management: Needs assistance Distance: 150'   Evaluation completed (see details above) with education on PT POC and goals and individual treatment initiated with focus on initiating functional bed mobility retraining (cues for adherence to precautions and technique), transfers, attempted sit <> stands but unable due to pain and weakness, and w/c mobility. Pt initially did not like the  w/c in the room with supportive back, so after transferring back to bed at end of session, PT changed out for standard slingback w/c in room to use next time OOB. Pt limited during session due to pain in back and unable to attempt or tolerate standing (declined use of stedy and  unable with RW with several attempts). Pt able to propel w/c on unit with supervision but expresses discomfort and needs rest breaks due to back pain and overall decreased endurance.     Discharge Criteria: Patient will be discharged from PT if patient refuses treatment 3 consecutive times without medical reason, if treatment goals not met, if there is a change in medical status, if patient makes no progress towards goals or if patient is discharged from hospital.  The above assessment, treatment plan, treatment alternatives and goals were discussed and mutually agreed upon: by patient  Juanna Cao, PT, DPT, CBIS  05/07/2020, 11:26 AM

## 2020-05-08 ENCOUNTER — Inpatient Hospital Stay (HOSPITAL_COMMUNITY): Payer: Medicare Other

## 2020-05-08 DIAGNOSIS — K592 Neurogenic bowel, not elsewhere classified: Secondary | ICD-10-CM

## 2020-05-08 DIAGNOSIS — M7989 Other specified soft tissue disorders: Secondary | ICD-10-CM

## 2020-05-08 DIAGNOSIS — G8918 Other acute postprocedural pain: Secondary | ICD-10-CM

## 2020-05-08 DIAGNOSIS — N1831 Chronic kidney disease, stage 3a: Secondary | ICD-10-CM

## 2020-05-08 LAB — COMPREHENSIVE METABOLIC PANEL
ALT: 34 U/L (ref 0–44)
AST: 30 U/L (ref 15–41)
Albumin: 2.8 g/dL — ABNORMAL LOW (ref 3.5–5.0)
Alkaline Phosphatase: 95 U/L (ref 38–126)
Anion gap: 9 (ref 5–15)
BUN: 14 mg/dL (ref 8–23)
CO2: 25 mmol/L (ref 22–32)
Calcium: 8.6 mg/dL — ABNORMAL LOW (ref 8.9–10.3)
Chloride: 101 mmol/L (ref 98–111)
Creatinine, Ser: 1.19 mg/dL (ref 0.61–1.24)
GFR, Estimated: >60 mL/min (ref >60)
Glucose, Bld: 112 mg/dL — ABNORMAL HIGH (ref 70–99)
Potassium: 4.3 mmol/L (ref 3.5–5.1)
Sodium: 135 mmol/L (ref 135–145)
Total Bilirubin: 0.5 mg/dL (ref 0.3–1.2)
Total Protein: 5.5 g/dL — ABNORMAL LOW (ref 6.5–8.1)

## 2020-05-08 LAB — CBC WITH DIFFERENTIAL/PLATELET
Abs Immature Granulocytes: 0.05 10*3/uL (ref 0.00–0.07)
Basophils Absolute: 0 10*3/uL (ref 0.0–0.1)
Basophils Relative: 0 %
Eosinophils Absolute: 0 10*3/uL (ref 0.0–0.5)
Eosinophils Relative: 0 %
HCT: 28.1 % — ABNORMAL LOW (ref 39.0–52.0)
Hemoglobin: 9.6 g/dL — ABNORMAL LOW (ref 13.0–17.0)
Immature Granulocytes: 1 %
Lymphocytes Relative: 26 %
Lymphs Abs: 2.6 10*3/uL (ref 0.7–4.0)
MCH: 31.2 pg (ref 26.0–34.0)
MCHC: 34.2 g/dL (ref 30.0–36.0)
MCV: 91.2 fL (ref 80.0–100.0)
Monocytes Absolute: 1 10*3/uL (ref 0.1–1.0)
Monocytes Relative: 10 %
Neutro Abs: 6.1 10*3/uL (ref 1.7–7.7)
Neutrophils Relative %: 63 %
Platelets: 283 10*3/uL (ref 150–400)
RBC: 3.08 MIL/uL — ABNORMAL LOW (ref 4.22–5.81)
RDW: 13.6 % (ref 11.5–15.5)
WBC: 9.8 10*3/uL (ref 4.0–10.5)
nRBC: 0 % (ref 0.0–0.2)

## 2020-05-08 MED ORDER — RIVAROXABAN 15 MG PO TABS
15.0000 mg | ORAL_TABLET | Freq: Two times a day (BID) | ORAL | Status: AC
  Administered 2020-05-08 – 2020-05-29 (×42): 15 mg via ORAL
  Filled 2020-05-08 (×44): qty 1

## 2020-05-08 MED ORDER — RIVAROXABAN 20 MG PO TABS
20.0000 mg | ORAL_TABLET | Freq: Every day | ORAL | Status: DC
  Administered 2020-05-29 – 2020-05-30 (×2): 20 mg via ORAL
  Filled 2020-05-08 (×2): qty 1

## 2020-05-08 MED FILL — Sodium Chloride IV Soln 0.9%: INTRAVENOUS | Qty: 1000 | Status: AC

## 2020-05-08 MED FILL — Heparin Sodium (Porcine) Inj 1000 Unit/ML: INTRAMUSCULAR | Qty: 30 | Status: AC

## 2020-05-08 NOTE — Progress Notes (Signed)
ANTICOAGULATION CONSULT NOTE - Initial Consult  Pharmacy Consult for rivaroxaban Indication: Left lower extremity DVT  Allergies  Allergen Reactions  . Penicillins Hives  . Oxycodone Other (See Comments)    confusion    Patient Measurements:  Weight 80 kg   Vital Signs: Temp: 98.3 F (36.8 C) (02/14 0344) Temp Source: Oral (02/14 0344) BP: 129/71 (02/14 0344) Pulse Rate: 86 (02/14 0344)  Labs: Recent Labs    05/06/20 1558 05/08/20 0501  HGB 10.1* 9.6*  HCT 30.8* 28.1*  PLT 281 283  CREATININE 1.06 1.19    Estimated Creatinine Clearance: 53.2 mL/min (by C-G formula based on SCr of 1.19 mg/dL).   Medical History: Past Medical History:  Diagnosis Date  . Anemia   . Back pain with radiculopathy   . Coronary artery disease   . DJD (degenerative joint disease)   . Hypertension   . PONV (postoperative nausea and vomiting)   . Vitamin D deficiency    Assessment: 86 yo male with age indeterminate left lower extremity DVT per doppler. CrCl 53.2. No anticoagulation PTA. Will start rivaroxaban per pharmacy. Patient will need education prior to discharge. Hgb 9.6, PLT 283  Goal of Therapy:  Prevention of VTE Monitor platelets by anticoagulation protocol: Yes   Plan:  Xarelto 15mg  BID x 3 weeks, then 20mg  daily Monitor for bleeding  Stacy Sailer A. , PharmD, BCPS, FNKF Clinical Pharmacist [place] Please utilize Amion for appropriate phone number to reach the unit pharmacist [facility])   05/08/2020,10:05 AM

## 2020-05-08 NOTE — IPOC Note (Signed)
Individualized overall Plan of Care [facility]) Patient Details Name: Timothy Ware MRN: [id number] DOB: 11/22/1934  Admitting Diagnosis: Thoracic myelopathy  Hospital Problems: Principal Problem:   Thoracic myelopathy Active Problems:   Stage 3a chronic kidney disease (HCC)   Neurogenic bowel     Functional Problem List: Nursing Medication Management,Endurance,Safety,Perception,Pain,Skin Integrity  PT Balance,Endurance,Motor,Pain,Safety,Sensory,Skin Integrity  OT Balance,Safety,Endurance,Motor,Pain  SLP    TR         Basic ADL's: OT Bathing,Dressing,Toileting     Advanced  ADL's: OT       Transfers: PT Bed Mobility,Car,Bed to Chair,Furniture  OT Toilet     Locomotion: PT Wheelchair Mobility,Ambulation,Stairs     Additional Impairments: OT None  SLP        TR      Anticipated Outcomes Item Anticipated Outcome  Self Feeding no goal set  Swallowing      Basic self-care  CGA  Toileting  min A   Bathroom Transfers CGA  Bowel/Bladder  remain continent of bowel/bladder while in rehab  Transfers  supervision w/c level transfers; min assist car transfer  Locomotion  mod I w/c mobility  Communication     Cognition     Pain  <2  Safety/Judgment  Able to call for help and follow safety plan   Therapy Plan: PT Intensity: Minimum of 1-2 x/day ,45 to 90 minutes PT Frequency: 5 out of 7 days PT Duration Estimated Length of Stay: 18-21 days OT Intensity: Minimum of 1-2 x/day, 45 to 90 minutes OT Frequency: 5 out of 7 days OT Duration/Estimated Length of Stay: 3 weeks      Team Interventions: Nursing Interventions Patient/Family Education,Disease Management/Prevention,Skin Care/Wound Management,Discharge Planning,Pain Management,Medication Management  PT interventions Ambulation/gait training,Balance/vestibular training,Community reintegration,Discharge planning,Disease management/prevention,DME/adaptive equipment instruction,Functional mobility  training,Neuromuscular re-education,Pain management,Patient/family education,Psychosocial support,Skin care/wound Engineer, water stimulation,Splinting/orthotics,Stair training,Therapeutic Activities,Therapeutic Exercise,UE/LE Strength taining/ROM,UE/LE Psychiatrist propulsion/positioning  OT Interventions Balance/vestibular training,Discharge planning,Pain management,Therapeutic Activities,UE/LE Coordination activities,Functional mobility training,Patient/family education,Therapeutic Exercise,UE/LE Strength taining/ROM,DME/adaptive equipment instruction,Self Care/advanced ADL retraining  SLP Interventions    TR Interventions    SW/CM Interventions Discharge Planning,Psychosocial Support,Patient/Family Education   Barriers to Discharge MD  Medical stability and Wound care  Nursing      PT Home environment access/layout,Other (comments) pain  OT      SLP      SW Decreased caregiver support Wife can not do much physical assist   Team Discharge Planning: Destination: PT-Home ,OT- Home , SLP-  Projected Follow-up: PT-Home health PT,24 hour supervision/assistance, OT-  Home health OT, SLP-  Projected Equipment Needs: PT- , OT- To be determined, SLP-  Equipment Details: PT-has w/c, RW, and hospital bed, OT-  Patient/family involved in discharge planning: PT- Patient,  OT-Patient, SLP-   MD ELOS: 16-19 days. Medical Rehab Prognosis:  Good Assessment: 86 year old right-handed male with history of hypertension, CAD with angioplasty, CKD stage III as well as chronic back pain with multiple back surgeries status post radiculopathy status post transpedicular microdiscectomy T10-11 early December 2021 and received inpatient rehab services 02/28/2020 03/22/2020. Presented 04/25/2020 with increasing right lower extremity weakness and numbness. 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  Underwent right  transpedicular decompression microdiscectomy at T10-11 with pedicle screw fixation.  Thoracic 10-12 with posterior lateral arthrodesis 05/02/2020 per Dr. Venetia Maxon.  Placed on Lovenox for DVT prophylaxis.  Back brace when out of bed applied in sitting position. Patient with resulting functional deficits with mobility, transfers, endurance, self-care.  Will set goals for Supervision/Min A with PT/OT.  Due to the current state of emergency, patients may not be receiving their 3-hours of Medicare-mandated therapy.  See Team Conference Notes for weekly updates to the plan of care

## 2020-05-08 NOTE — Progress Notes (Signed)
Inpatient Rehabilitation Center Individual Statement of Services  Patient Name:  Timothy Ware  Date:  05/08/2020  Welcome to the Inpatient Rehabilitation Center.  Our goal is to provide you with an individualized program based on your diagnosis and situation, designed to meet your specific needs.  With this comprehensive rehabilitation program, you will be expected to participate in at least 3 hours of rehabilitation therapies Monday-Friday, with modified therapy programming on the weekends.  Your rehabilitation program will include the following services:  Physical Therapy (PT), Occupational Therapy (OT), 24 hour per day rehabilitation nursing, Neuropsychology, Care Coordinator, Rehabilitation Medicine, Nutrition Services and Pharmacy Services  Weekly team conferences will be held on Wednesday to discuss your progress.  Your Inpatient Rehabilitation Care Coordinator will talk with you frequently to get your input and to update you on team discussions.  Team conferences with you and your family in attendance may also be held Expected length of stay: 18-21 days  Overall anticipated outcome: supervision-CGA wheelchair level  Depending on your progress and recovery, your program may change. Your Inpatient Rehabilitation Care Coordinator will coordinate services and will keep you informed of any changes. Your Inpatient Rehabilitation Care Coordinator's name and contact numbers are listed  below.  The following services may also be recommended but are not provided by the Inpatient Rehabilitation Center:    Home Health Rehabiltiation Services  Outpatient Rehabilitation Services    Arrangements will be made to provide these services after discharge if needed.  Arrangements include referral to agencies that provide these services.  Your insurance has been verified to be:  Medicare & AARP Your primary doctor is:  Lysle Dingwall  Pertinent information will be shared with your doctor and your  insurance company.  Inpatient Rehabilitation Care Coordinator:  Dossie Der, [place] [phone] or Luna [place]  Information discussed with and copy given to patient by: Lucy Chris, 05/08/2020, 10:21 AM

## 2020-05-08 NOTE — Progress Notes (Signed)
Inpatient Rehabilitation Care Coordinator Assessment and Plan Patient Details  Name: Timothy Ware MRN: [id number] Date of Birth: [birth year]  Today's Date: 05/08/2020  Hospital Problems: Principal Problem:   Thoracic myelopathy  Past Medical History:  Past Medical History:  Diagnosis Date  . Anemia   . Back pain with radiculopathy   . Coronary artery disease   . DJD (degenerative joint disease)   . Hypertension   . PONV (postoperative nausea and vomiting)   . Vitamin D deficiency    Past Surgical History:  Past Surgical History:  Procedure Laterality Date  . BACK SURGERY    . CARDIAC CATHETERIZATION  10/11/1996   NL LM, insignificant LAD, 75% pRCA, 95% dRCA, 75% OM2, EF 72%. S/p RCA stents x2 & CX x1.  Marland Kitchen CARPAL TUNNEL RELEASE Bilateral   . CORONARY ANGIOPLASTY     RCA PTCA/stent 10/29/1994; RCA stents x2, Cx x1, [facility] 10/11/1996  . ELBOW SURGERY Left   . EYE SURGERY Bilateral    cataract  . ROTATOR CUFF REPAIR Left   . THORACIC DISCECTOMY N/A 02/25/2020   Procedure: TRANSPEDICULAR MICRODISCECTOMY THORACIC TEN THORACIC ELEVEN;  Surgeon: Maeola Harman, MD;  Location: [facility] OR;  Service: Neurosurgery;  Laterality: N/A;  posterior   Social History:  reports that he has never smoked. He has never used smokeless tobacco. He reports that he does not drink alcohol and does not use drugs.  Family / Support Systems Marital Status: Married Patient Roles: Spouse,Parent Spouse/Significant Other: Linda-wife [phone] Children: Cheryl-daughter [phone] Son and another daughter Other Supports: Friends Anticipated Caregiver: Bonita Quin and children when they can Ability/Limitations of Caregiver: wife has health issues of her own-back issues. can not provide much physical assist Caregiver Availability: 24/7 Family Dynamics: Pt was recently here and did well went home mod/i wheelchair level. Pt is close with his children and extended family. He feels they will provide what he  needs.  Social History Preferred language: English Religion:  Cultural Background: No issues Education: HS Read: Yes Write: Yes Employment Status: Retired Marine scientist Issues: No issues Guardian/Conservator: None-according to MD pt is capaable of making his own decisions while here. Wife plans to be here daily to provide support   Abuse/Neglect Abuse/Neglect Assessment Can Be Completed: Yes Physical Abuse: Denies Verbal Abuse: Denies Sexual Abuse: Denies Exploitation of patient/patient's resources: Denies Self-Neglect: Denies  Emotional Status Pt's affect, behavior and adjustment status: Pt is motivated and glad to be back here. he voiced his surgery made him even weaker and caused a great deal of pain. He was doing wel at home before this happened Recent Psychosocial Issues: other health issues-was still recovering from prior back surgery. Functioned at wheelchair level PTA Psychiatric History: No issues-due to close back surgeries do feel pt would benefit from seeing neuro-psych while here. Substance Abuse History: None  Patient / Family Perceptions, Expectations & Goals Pt/Family understanding of illness & functional limitations: Pt and wfif can explain his surgery and now are not happy about his DVT-he is currently on a blodd thinner and still got a DVT. Pt and wife talk with the surgeon and feel they know waht is going on up to this point. Pt is not one that doesn;t speak up when he has a question or concern Premorbid pt/family roles/activities: Husband, father, retiree, grandfather, etc Anticipated changes in roles/activities/participation: resume Pt/family expectations/goals: Pt states: " I at least want to get to the level I was before this."  Wife states: " I hope he does well here. 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 Son and daughter's help also. Wife has back issues and can not provide much physical assist. Care Coordinator Barriers to Discharge: Decreased caregiver support Care Coordinator Barriers to Discharge Comments: Wife can not do much physical assist Care Coordinator Anticipated Follow Up Needs: HH/OP  Clinical Impression Pleasant gentleman who is known to this worker due to was here in 02/2020. He did well and was mod/i wheelchair level at discharge. Pt feels much weaker now and hopes to get to the level he was PTA. Wife will be here daily and provide support. Will work on discharge needs.  Lucy Chris 05/08/2020, 10:18 AM

## 2020-05-08 NOTE — Progress Notes (Signed)
Venous Doppler studies returned left femoral vein and popliteal vein.  Contact made to Dr. Venetia Maxon no surgery and will begin Xarelto.  Subcutaneous Lovenox can be discontinued.  Bedrest today

## 2020-05-08 NOTE — Progress Notes (Signed)
Physical Therapy Session Note  Patient Details  Name: Timothy Ware MRN: [id number] Date of Birth: 17-Mar-1935  Today's Date: 05/08/2020 PT Individual Time: 9470-9628 PT Individual Time Calculation (min): 14 min   Short Term Goals: Week 1:  PT Short Term Goal 1 (Week 1): Pt will be able to perform min assist squat pivot transfers bed <> w/c PT Short Term Goal 2 (Week 1): Pt will be able to perform sit <> stand with max assist PT Short Term Goal 3 (Week 1): Pt will be able to tolerate OOB in chair x 2 hours a day  Skilled Therapeutic Interventions/Progress Updates:    Patient in supine and reports pain better controlled in supine than this morning with OT.  Reports R LE spasms kept him up all night.  Concerned about level of weakness and wants to get stronger.  Initiated in bed therex, but RN entered room and reported MD stated pt with new DVT and needs to be on bedrest for now.  Patient requesting theraband to work UE's in the bed.  Obtained orange t-band and pt demonstrated appropriate exercises using band for horizontal abduction, triceps and biceps strengthening.  Left pt in supine with needs in reach and bed alarm active.   Therapy Documentation Precautions:  Precautions Precautions: Fall,Back Precaution Comments: Back precautions, TLSO EOB Required Braces or Orthoses: Spinal Brace Spinal Brace: Thoracolumbosacral orthotic,Applied in sitting position Restrictions Weight Bearing Restrictions: No General: PT Amount of Missed Time (min): 46 Minutes & 45 minutes PT Missed Treatment Reason: MD hold (Comment) (new DVT) Pain: Pain Assessment Pain Scale: 0-10 Pain Score: 3  Pain Type: Acute pain Pain Location: Back Pain Descriptors / Indicators: Throbbing Pain Frequency: Intermittent Pain Onset: On-going Patients Stated Pain Goal: 2 Pain Intervention(s): Repositioned;Rest    Therapy/Group: Individual Therapy  Elray Mcgregor  Sheran Lawless, PT 05/08/2020, 9:54 AM

## 2020-05-08 NOTE — Progress Notes (Signed)
Occupational Therapy Session Note  Patient Details  Name: Timothy Ware MRN: [id number] Date of Birth: January 20, 1935  Today's Date: 05/08/2020 OT Individual Time: 7322-0254 OT Individual Time Calculation (min): 46 min  and Today's Date: 05/08/2020 OT Missed Time: 14 Minutes Missed Time Reason: Pain;Patient fatigue   Short Term Goals: Week 1:  OT Short Term Goal 1 (Week 1): Pt will use LRAD to don LB clothing with mod A OT Short Term Goal 2 (Week 1): Pt will don shirt with min A OT Short Term Goal 3 (Week 1): Pt will complete BSC transfer with mod A  Skilled Therapeutic Interventions/Progress Updates:    Treatment session with focus on functional transfers, sit > stand, and activity tolerance.  Pt received supine in bed reporting having a bad night.  Pt reports no sleep due to pain and interruptions overnight.  Pt agreeable to grooming tasks at sink.  Mod assist bed mobility and max assist sit > stand from elevated EOB with RW.  Therapist donned TLSO while pt seated EOB prior to transfer to w/c.  Pt reports numbness in RLE with inability to move it.  Pt completed stand pivot transfer with mod-max assist with RW with multimodal cueing and assistance to advance RLE.  Pt sat quickly to chair due to weakness.  Pt completed oral care, washing face, and shaving while seated at sink.  Pt with frequent c/o pain in sitting but did not rate.  Pt again reporting fatigue and pain, therefore requesting to return to bed.  Completed squat pivot transfer with multiple squats/scoots to R with max assist.  Therapist doffed TLSO and lifted BLE in to bed while pt eased down in to supine while adhering to back precautions.  Pt missed remaining 14 mins due to pain and fatigue.  RN arriving to administer morning meds and to also administer pain meds.  Therapy Documentation Precautions:  Precautions Precautions: Fall,Back Precaution Comments: Back precautions, TLSO EOB Required Braces or Orthoses: Spinal Brace Spinal  Brace: Thoracolumbosacral orthotic,Applied in sitting position Restrictions Weight Bearing Restrictions: No General: General OT Amount of Missed Time: 14 Minutes Pain: Pt with c/o pain 5/10 in back at rest.  Increased groaning and c/o pain with activity but did not rate.  RN notified.  Therapy/Group: Individual Therapy  Rosalio Loud 05/08/2020, 8:20 AM

## 2020-05-08 NOTE — Progress Notes (Signed)
PROGRESS NOTE   Subjective/Complaints: Patient seen laying in bed this AM.  He states he did not sleep well overnight because of his "leg jerking".  This improved with medications.    ROS: Denies CP, SOB, N/V/D  Objective:   No results found. Recent Labs    05/06/20 1558 05/08/20 0501  WBC 9.5 9.8  HGB 10.1* 9.6*  HCT 30.8* 28.1*  PLT 281 283   Recent Labs    05/06/20 1558 05/08/20 0501  NA  --  135  K  --  4.3  CL  --  101  CO2  --  25  GLUCOSE  --  112*  BUN  --  14  CREATININE 1.06 1.19  CALCIUM  --  8.6*    Intake/Output Summary (Last 24 hours) at 05/08/2020 0925 Last data filed at 05/08/2020 0340 Gross per 24 hour  Intake 230 ml  Output 1350 ml  Net -1120 ml        Physical Exam: Vital Signs Blood pressure 129/71, pulse 86, temperature 98.3 F (36.8 C), temperature source Oral, resp. rate 18, SpO2 99 %. Constitutional: No distress . Vital signs reviewed. HENT: Normocephalic.  Atraumatic. Eyes: EOMI. No discharge. Cardiovascular: No JVD.  RRR. Respiratory: Normal effort.  No stridor.  Bilateral clear to auscultation. GI: Non-distended.  BS +. Skin: Warm and dry.   Back incision with dressing CDI Psych: Normal mood.  Normal behavior. Musc: No edema in extremities.  No tenderness in extremities. Neuro: Alert Motor: B/l UE: 5/5 proximal to distal LLE: 4+/5 proximal to distal RLE: HF, KE 4-/5, ADF 4+/5 Sensation diminished to light touch RLE  Assessment/Plan: 1. Functional deficits which require 3+ hours per day of interdisciplinary therapy in a comprehensive inpatient rehab setting.  Physiatrist is providing close team supervision and 24 hour management of active medical problems listed below.  Physiatrist and rehab team continue to assess barriers to discharge/monitor patient progress toward functional and medical goals  Care Tool:  Bathing  Bathing activity did not occur: Refused            Bathing assist       Upper Body Dressing/Undressing Upper body dressing Upper body dressing/undressing activity did not occur (including orthotics): Refused What is the patient wearing?: (P) Button up shirt    Upper body assist      Lower Body Dressing/Undressing Lower body dressing    Lower body dressing activity did not occur: Refused What is the patient wearing?: (P) Pants     Lower body assist       Toileting Toileting Toileting Activity did not occur (Clothing management and hygiene only): N/A (no void or bm)  Toileting assist Assist for toileting: Independent (with urinal; use stedy to transfer to [employer])     Transfers Chair/bed transfer  Transfers assist  Chair/bed transfer activity did not occur: Refused  Chair/bed transfer assist level: Maximal Assistance - Patient 25 - 49%     Locomotion Ambulation   Ambulation assist   Ambulation activity did not occur: Safety/medical concerns (pain and unable to stand)          Walk 10 feet activity   Assist  Walk 10 feet  activity did not occur: Safety/medical concerns        Walk 50 feet activity   Assist Walk 50 feet with 2 turns activity did not occur: Safety/medical concerns         Walk 150 feet activity   Assist Walk 150 feet activity did not occur: Safety/medical concerns         Walk 10 feet on uneven surface  activity   Assist Walk 10 feet on uneven surfaces activity did not occur: Safety/medical concerns         Wheelchair     Assist Will patient use wheelchair at discharge?: Yes Type of Wheelchair: Manual    Wheelchair assist level: Supervision/Verbal cueing Max wheelchair distance: 150    Wheelchair 50 feet with 2 turns activity    Assist        Assist Level: Supervision/Verbal cueing   Wheelchair 150 feet activity     Assist      Assist Level: Supervision/Verbal cueing   Medical Problem List and Plan: 1.Debilitysecondary to  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 Status post right transpedicular decompression microdiscectomy T10-11 with pedicle screw fixation T10-12 with posterior lateral arthrodesis 05/02/2020. Back brace when out of bed  Bedrest today due to locations of DVTs  2. Antithrombotics: -DVT/anticoagulation:Lovenox changed to Xarelto after discussion with neurosurgery  Left femoral and popliteal DVT -antiplatelet therapy: N/A 3. Pain Management:Baclofen 10 mg 3 times daily, hydrocodone as needed. Robaxin and Anaprox as needed  Monitor with increased mobility 4. Mood:Provide emotional support -antipsychotic agents: N/A 5. Neuropsych: This patientiscapable of making decisions on hisown behalf. 6. Skin/Wound Care: Routine skin care  7. Fluids/Electrolytes/Nutrition:Routine in and outs 8. Hypertension. Tenormin 25 mg daily.   Controlled on 2/14  Monitor with increased mobility 9. Neurogenic bowel andbladder.   PVRs ordered  Adjust bowel meds as necessary  10. Hyperlipidemia. Lipitor 11. CKD stage III. Creatinine baseline 1.27.   Creatinine 1.19 on 2/14 12. Nausea:?  Resolved    LOS: 2 days A FACE TO FACE EVALUATION WAS PERFORMED  Michale Weikel [name] 05/08/2020, 9:25 AM

## 2020-05-08 NOTE — Progress Notes (Signed)
Inpatient Rehabilitation  Patient information reviewed and entered into eRehab system by [facility] M. Rohith Fauth, M.A., CCC/SLP, PPS Coordinator.  Information including medical coding, functional ability and quality indicators will be reviewed and updated through discharge.    

## 2020-05-08 NOTE — Progress Notes (Signed)
Bilateral lower extremity venous duplex has been completed. Preliminary results can be found in CV Proc through chart review.  Results were given to [facility] RN.  05/08/20 9:26 AM Olen Cordial RVT

## 2020-05-09 DIAGNOSIS — I82412 Acute embolism and thrombosis of left femoral vein: Secondary | ICD-10-CM

## 2020-05-09 MED ORDER — MANAGING BACK PAIN BOOK
Freq: Once | Status: AC
  Filled 2020-05-09: qty 1

## 2020-05-09 NOTE — Progress Notes (Signed)
Physical Therapy Session Note  Patient Details  Name: Timothy Ware MRN: [id number] Date of Birth: Aug 16, 1934  Today's Date: 05/09/2020 PT Individual Time: 5956-3875 PT Individual Time Calculation (min): 54 min   Short Term Goals: Week 1:  PT Short Term Goal 1 (Week 1): Pt will be able to perform min assist squat pivot transfers bed <> w/c PT Short Term Goal 2 (Week 1): Pt will be able to perform sit <> stand with max assist PT Short Term Goal 3 (Week 1): Pt will be able to tolerate OOB in chair x 2 hours a day  Skilled Therapeutic Interventions/Progress Updates:   Received pt semi-reclined in bed requesting to use restroom immediately due to "cramping" in stomach. Per MD, pt off bedrest and cleared to participate in therapy. Session with emphasis on functional mobility/transfers, toileting, dressing, generalized strengthening, dynamic standing balance/coordination, and improved activity tolerance. Pt transferred semi-reclined<>sitting EOB with max A for RLE management due to weakness and requested to use Stedy to get to bathroom due to urgency. Donned TLSO sitting EOB with total A and pt transferred sit<>stand in [place] with mod A and transported to toilet with bedside commode over top dependently. Stand<>sit with min A for eccentric descent. Pt with medium loose BM and able to void but with poor control, soiling himself and bathroom floor requiring total A from therapist to clean. Pt reported back pain 4/10 (premedicated) throughout session. Pt declined any additional pain interventions but constantly stating "oh God, it hurts" throughout session. Pt able to perform seated peri-care with supervision and donned pants in sitting with total A to thread RLE through and max A for LLE. Pt transferred sit<>stand in [place] with min A and required total A to pull pants over hips. Dependent transfer to [facility] and pt sat in WC and washed hands, washed face, and brushed teeth with supervision. Noted pt with preference  for flexion to relieve back pain and pt frequently leaning forward over handlebars of Stedy. Doffed TLSO and dirty gown and donned clean pull over shirt with supervision and reapplied TLSO with total A. Pt performed the following exercises sitting in WC with supervision and verbal cues for technique: -bilateral anle PF with orange TB 2x10 -L hip flexion 2x10 Concluded session with pt sitting in WC, needs within reach, and seatbelt alarm on. NT present at bedside changing sheets.   Therapy Documentation Precautions:  Precautions Precautions: Fall,Back Precaution Comments: Back precautions, TLSO EOB Required Braces or Orthoses: Spinal Brace Spinal Brace: Thoracolumbosacral orthotic,Applied in sitting position Restrictions Weight Bearing Restrictions: No  Therapy/Group: Individual Therapy Martin Majestic PT, DPT   05/09/2020, 7:16 AM

## 2020-05-09 NOTE — Progress Notes (Signed)
Occupational Therapy Session Note  Patient Details  Name: Timothy Ware MRN: [id number] Date of Birth: 31-May-1934  Today's Date: 05/09/2020 OT Individual Time: 7035-0093 OT Individual Time Calculation (min): 43 min  and Today's Date: 05/09/2020 OT Missed Time: 32 Minutes Missed Time Reason: Pain;Patient fatigue   Short Term Goals: Week 1:  OT Short Term Goal 1 (Week 1): Pt will use LRAD to don LB clothing with mod A OT Short Term Goal 2 (Week 1): Pt will don shirt with min A OT Short Term Goal 3 (Week 1): Pt will complete BSC transfer with mod A  Skilled Therapeutic Interventions/Progress Updates:    Treatment session with focus on functional transfers and activity tolerance.  Pt received in sidelying reporting excruciating pain in abdomen.  Pt reports earlier sensation of urgent need to have BM, however once transferred to toilet via Stedy no success on toilet.  Pt expressing decreased motivation to complete mobility due to pain, however expresses understanding of importance of mobility for recovery.  Therapist discussed use of drop arm BSC for toileting needs to speed up transfer (without use of Stedy) as urgency demands.  Therapist obtained wide drop arm BSC to allow pt to have surface area for transfers.  Pt completed bed mobility with mod assist to advance RLE off EOB.  Mod assist to come up to sitting EOB while focusing on adherence to back precautions.  Pt completed lateral scoots along EOB with min assist and increased time.  Mod assist for squat pivot from bed > BSC with therapist providing facilitation for anterior weight shift and clearance of buttocks.  Pt reports no need to toilet at this time.  Returned to bed via same method of multiple mini squats/latearal scoots.  Once returned to EOB, pt able to complete lateral scoots with improved clearance of buttocks to advance towards HOB.  Pt returned to supine with assist to lift RLE then pt able to roll in to sidelying with mod assist.  Pt  declined any further therapy at this time due to abdominal pain/discomfort.  RN notified of abdominal pain.  RN discussed with pt increased stool softeners due to pain medications and to expect intermittent "cramping" in abdomen as stool softeners work.    Therapy Documentation Precautions:  Precautions Precautions: Fall,Back Precaution Comments: Back precautions, TLSO EOB Required Braces or Orthoses: Spinal Brace Spinal Brace: Thoracolumbosacral orthotic,Applied in sitting position Restrictions Weight Bearing Restrictions: No General: General OT Amount of Missed Time: 32 Minutes Vital Signs: Therapy Vitals Temp: 97.7 F (36.5 C) Temp Source: Oral Pulse Rate: 71 Resp: 17 BP: (!) 130/53 Patient Position (if appropriate): Lying Oxygen Therapy SpO2: 100 % O2 Device: Room Air Pain:  Pt with c/o pain, reports 8/10 in abdomen.  RN notified.   Therapy/Group: Individual Therapy  Rosalio Loud 05/09/2020, 3:00 PM

## 2020-05-09 NOTE — Progress Notes (Signed)
Physical Therapy Session Note  Patient Details  Name: Timothy Ware MRN: [id number] Date of Birth: Aug 03, 1934  Today's Date: 05/09/2020 PT Individual Time: 1006-1102 PT Individual Time Calculation (min): 56 min   Short Term Goals: Week 1:  PT Short Term Goal 1 (Week 1): Pt will be able to perform min assist squat pivot transfers bed <> w/c PT Short Term Goal 2 (Week 1): Pt will be able to perform sit <> stand with max assist PT Short Term Goal 3 (Week 1): Pt will be able to tolerate OOB in chair x 2 hours a day  Skilled Therapeutic Interventions/Progress Updates:    Pt received sitting in w/c and upon seeing therapist pt states "thank goodness you're here...my back is killing me." Pt wearing TLSO and reporting he feels that the w/c is pushing him to lean too far forward and that the brace is not comfortable resulting in his pain increasing to 10/10 while sitting. R squat pivot transfer w/c>EOB mod assist for lifting/pivoting hips - requires assist for R LE positioning and management during transfer. Doffed TLSO total assist. Sit>supine via reverse logroll technique, mod cuing for sequencing, and mod assist for B LE management into bed. Once in supine encouraged pt to relax in hooklying position, therapist assisting with LEs maintaining this position, and cued pt to focus on deep breathing and relaxation for pain management - noted to have some muscle spasms while trying to relax that dissipated after ~30seconds. Pt reports he feels that the current w/c cushion is too thick stating he only has basic 2" cushion at home - therapist exchanged cushions. Pt reporting pain decreasing to 8/10 during supine rest break. Supine void into urinal mod-I.  Pt agreeable to supine B LE exercises and performed the following:  - L LE heel slides 2x10 reps with minimal manual resistance into extension during 2nd set - R LE heel slides with active assist into flexion and minimal manual resistance into extension 2x10reps  with pt experiencing neural fatigue towards end of each set - hooklying hip adduction pillow squeezes 5sec hold 2x10 reps - focusing on not allowing R LE to fall out to the side between each rep with pt demoing good compliance - hooklying hip abduction against level 2 theraband resistance 2x10reps with external target to reach with R LE to promote increased motor recruitment and further ROM  Pt reports wearing his tennis shoes at night to allow him to get traction in order to push through his legs and scoot up towards HOB. Pt educated on need for PRAFO on R LE due to paresis not allowing pt to unweight/reposition LE with risk of pressure wound on heel and ankle PF tightness - notified primary PT. Assessed R heel with only 1 small blanchable red spot with not other skin integrity concerns. At end of session pt states "it just aches a little bit but it ain't nothing you can't live with" referring to his back pain. Pt left supine in bed with needs in reach ad bed alarm on.   Therapy Documentation Precautions:  Precautions Precautions: Fall,Back Precaution Comments: Back precautions, TLSO EOB Required Braces or Orthoses: Spinal Brace Spinal Brace: Thoracolumbosacral orthotic,Applied in sitting position Restrictions Weight Bearing Restrictions: No  Pain:   Pain 10/10 upon therapist arrival - premedicated - decreased to 8/10 with supine rest break and then decreased further during supine LE exercises. Details above.   Therapy/Group: Individual Therapy  Ginny Forth , PT, DPT, CSRS  05/09/2020, 7:59 AM

## 2020-05-09 NOTE — Progress Notes (Signed)
PROGRESS NOTE   Subjective/Complaints: Patient seen laying in bed this AM.  He states he slept fairly well overnight.  Discussed DVT with patient, he is ready to resume therapies.   ROS: Denies CP, SOB, N/V/D  Objective:   VAS [place] LOWER EXTREMITY VENOUS (DVT)  Result Date: 05/08/2020  Lower Venous DVT Study Indications: Swelling.  Risk Factors: None identified. Limitations: Poor ultrasound/tissue interface. Comparison Study: No prior studies. Performing Technologist: Chanda Busing RVT  Examination Guidelines: A complete evaluation includes B-mode imaging, spectral Doppler, color Doppler, and power Doppler as needed of all accessible portions of each vessel. Bilateral testing is considered an integral part of a complete examination. Limited examinations for reoccurring indications may be performed as noted. The reflux portion of the exam is performed with the patient in reverse Trendelenburg.  +---------+---------------+---------+-----------+----------+--------------+ RIGHT    CompressibilityPhasicitySpontaneityPropertiesThrombus Aging +---------+---------------+---------+-----------+----------+--------------+ CFV      Full           Yes      Yes                                 +---------+---------------+---------+-----------+----------+--------------+ SFJ      Full                                                        +---------+---------------+---------+-----------+----------+--------------+ FV Prox  Full                                                        +---------+---------------+---------+-----------+----------+--------------+ FV Mid   Full                                                        +---------+---------------+---------+-----------+----------+--------------+ FV DistalFull                                                         +---------+---------------+---------+-----------+----------+--------------+ PFV      Full                                                        +---------+---------------+---------+-----------+----------+--------------+ POP      Full           Yes      Yes                                 +---------+---------------+---------+-----------+----------+--------------+  PTV      Full                                                        +---------+---------------+---------+-----------+----------+--------------+ PERO     Full                                                        +---------+---------------+---------+-----------+----------+--------------+   +---------+---------------+---------+-----------+----------+-------------------+ LEFT     CompressibilityPhasicitySpontaneityPropertiesThrombus Aging      +---------+---------------+---------+-----------+----------+-------------------+ CFV      Full           Yes      Yes                                      +---------+---------------+---------+-----------+----------+-------------------+ SFJ      Full                                                             +---------+---------------+---------+-----------+----------+-------------------+ FV Prox  Full                                                             +---------+---------------+---------+-----------+----------+-------------------+ FV Mid   Partial        No       No                   Age Indeterminate   +---------+---------------+---------+-----------+----------+-------------------+ FV DistalPartial        Yes      Yes                  Age Indeterminate   +---------+---------------+---------+-----------+----------+-------------------+ PFV      Full                                                             +---------+---------------+---------+-----------+----------+-------------------+ POP      Partial        No       No                    Age Indeterminate   +---------+---------------+---------+-----------+----------+-------------------+ PTV      Full                                                             +---------+---------------+---------+-----------+----------+-------------------+ PERO  Not well visualized +---------+---------------+---------+-----------+----------+-------------------+ Gastroc  Full                                                             +---------+---------------+---------+-----------+----------+-------------------+    Summary: RIGHT: - There is no evidence of deep vein thrombosis in the lower extremity.  - No cystic structure found in the popliteal fossa.  LEFT: - Findings consistent with age indeterminate deep vein thrombosis involving the left femoral vein, and left popliteal vein. - No cystic structure found in the popliteal fossa.  *See table(s) above for measurements and observations. Electronically signed by Fabienne Bruns MD on 05/08/2020 at 4:26:58 PM.    Final    Recent Labs    05/06/20 1558 05/08/20 0501  WBC 9.5 9.8  HGB 10.1* 9.6*  HCT 30.8* 28.1*  PLT 281 283   Recent Labs    05/06/20 1558 05/08/20 0501  NA  --  135  K  --  4.3  CL  --  101  CO2  --  25  GLUCOSE  --  112*  BUN  --  14  CREATININE 1.06 1.19  CALCIUM  --  8.6*    Intake/Output Summary (Last 24 hours) at 05/09/2020 0834 Last data filed at 05/09/2020 0714 Gross per 24 hour  Intake 950 ml  Output 1509 ml  Net -559 ml        Physical Exam: Vital Signs Blood pressure 133/79, pulse 73, temperature 98.5 F (36.9 C), resp. rate 18, SpO2 99 %.  Constitutional: No distress . Vital signs reviewed. HENT: Normocephalic.  Atraumatic. Eyes: EOMI. No discharge. Cardiovascular: No JVD.  RRR. Respiratory: Normal effort.  No stridor.  Bilateral clear to auscultation. GI: Non-distended.  BS +. Skin: Warm and dry.   Back incision with  dressing CDI Psych: Normal mood.  Normal behavior. Musc: No edema in extremities.  No tenderness in extremities. Neuro: Alert Motor: B/l UE: 5/5 proximal to distal LLE: 4+/5 proximal to distal, unchanged RLE: HF, KE 4-/5, ADF 4+/5, unchanged Sensation diminished to light touch RLE  Assessment/Plan: 1. Functional deficits which require 3+ hours per day of interdisciplinary therapy in a comprehensive inpatient rehab setting.  Physiatrist is providing close team supervision and 24 hour management of active medical problems listed below.  Physiatrist and rehab team continue to assess barriers to discharge/monitor patient progress toward functional and medical goals  Care Tool:  Bathing  Bathing activity did not occur: Refused           Bathing assist       Upper Body Dressing/Undressing Upper body dressing Upper body dressing/undressing activity did not occur (including orthotics): Refused What is the patient wearing?: (P) Button up shirt    Upper body assist      Lower Body Dressing/Undressing Lower body dressing    Lower body dressing activity did not occur: Refused What is the patient wearing?: (P) Pants     Lower body assist       Toileting Toileting Toileting Activity did not occur (Clothing management and hygiene only): N/A (no void or bm)  Toileting assist Assist for toileting: (P) Minimal Assistance - Patient > 75%     Transfers Chair/bed transfer  Transfers assist  Chair/bed transfer activity did not occur: Refused  Chair/bed transfer assist level: Maximal Assistance -  Patient 25 - 49%     Locomotion Ambulation   Ambulation assist   Ambulation activity did not occur: Safety/medical concerns (pain and unable to stand)          Walk 10 feet activity   Assist  Walk 10 feet activity did not occur: Safety/medical concerns        Walk 50 feet activity   Assist Walk 50 feet with 2 turns activity did not occur: Safety/medical  concerns         Walk 150 feet activity   Assist Walk 150 feet activity did not occur: Safety/medical concerns         Walk 10 feet on uneven surface  activity   Assist Walk 10 feet on uneven surfaces activity did not occur: Safety/medical concerns         Wheelchair     Assist Will patient use wheelchair at discharge?: Yes Type of Wheelchair: Manual    Wheelchair assist level: Supervision/Verbal cueing Max wheelchair distance: 150    Wheelchair 50 feet with 2 turns activity    Assist        Assist Level: Supervision/Verbal cueing   Wheelchair 150 feet activity     Assist      Assist Level: Supervision/Verbal cueing   Medical Problem List and Plan: 1.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 Status post right transpedicular decompression microdiscectomy T10-11 with pedicle screw fixation T10-12 with posterior lateral arthrodesis 05/02/2020. Back brace when out of bed  Continue CIR 2. Antithrombotics: -DVT/anticoagulation:Xarelto  Left femoral and popliteal DVT -antiplatelet therapy: N/A 3. Pain Management:Baclofen 10 mg 3 times daily, hydrocodone as needed. Robaxin and Anaprox as needed  Controlled with meds on 2/15  Monitor with increased mobility 4. Mood:Provide emotional support -antipsychotic agents: N/A 5. Neuropsych: This patientiscapable of making decisions on hisown behalf. 6. Skin/Wound Care: Routine skin care  7. Fluids/Electrolytes/Nutrition:Routine in and outs 8. Hypertension. Tenormin 25 mg daily.   Controlled on 2/15  Monitor with increased mobility 9. Neurogenic bowel andbladder.   PVRs ordered, pending  Adjust bowel meds as necessary - improving  10. Hyperlipidemia. Lipitor 11. CKD stage III. Creatinine baseline 1.27.   Creatinine 1.19 on 2/14 12. Nausea:?  Resolved   LOS: 3 days A FACE TO FACE EVALUATION WAS  PERFORMED  Marguerita Stapp Karis Jubanil Alysha Doolan 05/09/2020, 8:34 AM

## 2020-05-09 NOTE — Progress Notes (Signed)
Patient ID: Timothy Ware, male   DOB: 1934/10/16, 86 y.o.   MRN: [id number] Met with the patient to review role of the nurse CM and address educational needs. Reviewed collaboration with the SW to facilitate preparation for discharge. Discussed pain management, fatigue and plans to manage after discharge. Reviewed vitamin deficiency, protein level and low Hgb level ,DVT prophylaxis and dietary modifications along with medication management. Continue to follow along to discharge to address questions, educational needs. Margarito Liner

## 2020-05-09 NOTE — Discharge Instructions (Addendum)
Inpatient Rehab Discharge Instructions  Timothy Ware Discharge date and time: No discharge date for patient encounter.   Activities/Precautions/ Functional Status: Activity: Back brace when out of bed Diet: Regular Wound Care: Routine skin checks Functional status:  ___ No restrictions     ___ Walk up steps independently ___ 24/7 supervision/assistance   ___ Walk up steps with assistance ___ Intermittent supervision/assistance  ___ Bathe/dress independently ___ Walk with walker     _x__ Bathe/dress with assistance ___ Walk Independently    ___ Shower independently ___ Walk with assistance    ___ Shower with assistance ___ No alcohol     ___ Return to work/school ________  Special Instructions: No driving smoking or alcohol    COMMUNITY REFERRALS UPON DISCHARGE:    Home Health:   PT, OT, RN, AIDE                  Agency:[facility] Phone:[phone]   Medical Equipment/Items Ordered:HAS HOSPITAL BED, WHEELCHAIR, DROP-ARM BEDSIDE COMMODE, TUB BENCH AND TRANSFER BOARD FROM PAST ADMIT 02/2020                                        My questions have been answered and I understand these instructions. I will adhere to these goals and the provided educational materials after my discharge from the hospital.  Patient/Caregiver Signature _______________________________ Date __________  Clinician Signature _______________________________________ Date __________  Please bring this form and your medication list with you to all your follow-up doctor's appointments.  Information on my medicine - XARELTO (rivaroxaban)  This medication education was reviewed with me or my healthcare representative as part of my discharge preparation.    WHY WAS XARELTO PRESCRIBED FOR YOU? Xarelto was prescribed to treat blood clots that may have been found in the veins of your legs (deep vein thrombosis) or in your lungs (pulmonary embolism) and to reduce the risk of them occurring  again.  What do you need to know about Xarelto? The starting dose is one 15 mg tablet taken TWICE daily with food for the FIRST 21 DAYS then on 05/29/20  the dose is changed to one 20 mg tablet taken ONCE A DAY with your evening meal.  DO NOT stop taking Xarelto without talking to the health care provider who prescribed the medication.  Refill your prescription for 20 mg tablets before you run out.  After discharge, you should have regular check-up appointments with your healthcare provider that is prescribing your Xarelto.  In the future your dose may need to be changed if your kidney function changes by a significant amount.  What do you do if you miss a dose? If you are taking Xarelto TWICE DAILY and you miss a dose, take it as soon as you remember. You may take two 15 mg tablets (total 30 mg) at the same time then resume your regularly scheduled 15 mg twice daily the next day.  If you are taking Xarelto ONCE DAILY and you miss a dose, take it as soon as you remember on the same day then continue your regularly scheduled once daily regimen the next day. Do not take two doses of Xarelto at the same time.   Important Safety Information Xarelto is a blood thinner medicine that can cause bleeding. You should call your healthcare provider right away if you experience any of the following: ? Bleeding from an injury or your  nose that does not stop. ? Unusual colored urine (red or dark brown) or unusual colored stools (red or black). ? Unusual bruising for unknown reasons. ? A serious fall or if you hit your head (even if there is no bleeding).  Some medicines may interact with Xarelto and might increase your risk of bleeding while on Xarelto. To help avoid this, consult your healthcare provider or pharmacist prior to using any new prescription or non-prescription medications, including herbals, vitamins, non-steroidal anti-inflammatory drugs (NSAIDs) and supplements.  This website has more  information on Xarelto: VisitDestination.com.br.

## 2020-05-09 NOTE — Discharge Summary (Signed)
Physician Discharge Summary  Patient ID: Timothy Ware MRN: [id number] DOB/AGE: 1934/05/02 86 y.o.  Admit date: 04/25/2020 Discharge date: 05/06/2020  Admission Diagnoses: Thoracic stenosis with myelopathy  Discharge Diagnoses: Thoracic stenosis with myelopathy s/p right transpedicular decompression and microdiscectomy T10-11 with fixation T10-T12  Active Problems:   Lower extremity weakness   Discharged Condition: good  Hospital Course: Timothy Ware was admitted vis Emergency Department 04/26/20 with intractable back pain and urinary retention. He had undergone Left T10-11 transpedicular discectomy on 02/25/20 from which he initially recovered well. With this admission, imaging revealed worsening thoracic stenosis and surgical intervention was planned. Following surgery (above), he recovered well with resolution of most pain symptoms. He is mobilizing with the assistance of PT & OT.   Consults: rehabilitation medicine  Significant Diagnostic Studies: radiology: X-Ray: intra-operative  Treatments: surgery: Right transpedicular decompression/microdiscectomy at Thoracic ten-eleven with pedicle screw  fixation Thoracic ten to Thoracic twelve with posterolateral arthrodesis  Discharge Exam: Blood pressure (!) 130/53, pulse 76, temperature 97.9 F (36.6 C), temperature source Oral, resp. rate 18, height 6\' 1"  (1.854 m), weight 80 kg, SpO2 97 %. Right leg strength better today than yesterday.  Better than antigravity with improved distal LE strength.  Still quite numb.  Left leg strength is good.  Dressing CDI.  Disposition: Patient is improving.  D/C drain.  Transfer to Rehab today 05/06/20  Discharge Instructions     Remove dressing in 72 hours   Complete by: As directed    Diet - low sodium heart healthy   Complete by: As directed    Increase activity slowly   Complete by: As directed      Allergies as of 05/06/2020      Reactions   Penicillins Hives   Oxycodone Other (See Comments)    confusion      Medication List    TAKE these medications   acetaminophen 325 MG tablet Commonly known as: TYLENOL Take 2 tablets (650 mg total) by mouth every 4 (four) hours as needed for mild pain ((score 1 to 3) or temp > 100.5).   alum & mag hydroxide-simeth 200-200-20 MG/5ML suspension Commonly known as: MAALOX/MYLANTA Take 30 mLs by mouth every 6 (six) hours as needed for indigestion.   atenolol 25 MG tablet Commonly known as: TENORMIN Take 1 tablet (25 mg total) by mouth daily.   atorvastatin 40 MG tablet Commonly known as: LIPITOR Take 0.5 tablets (20 mg total) by mouth daily.   B-complex with vitamin C tablet Take 1 tablet by mouth daily.   baclofen 10 MG tablet Commonly known as: LIORESAL Take 10 mg by mouth 3 (three) times daily.   benazepril 5 MG tablet Commonly known as: LOTENSIN Take 1 tablet (5 mg total) by mouth daily.   bisacodyl 10 MG suppository Commonly known as: DULCOLAX Place 1 suppository (10 mg total) rectally daily as needed for moderate constipation. What changed: Another medication with the same name was added. Make sure you understand how and when to take each.   bisacodyl 5 MG EC tablet Commonly known as: DULCOLAX Take 1-2 tablets (5-10 mg total) by mouth daily as needed for severe constipation. What changed: You were already taking a medication with the same name, and this prescription was added. Make sure you understand how and when to take each.   diclofenac Sodium 1 % Gel Commonly known as: VOLTAREN Apply 2 g topically daily as needed (For back pain).   magnesium oxide 400 MG tablet Commonly known as: MAG-OX Take 400  mg by mouth daily.   menthol-cetylpyridinium 3 MG lozenge Commonly known as: CEPACOL Take 1 lozenge (3 mg total) by mouth as needed for sore throat.   multivitamin with minerals Tabs tablet Take 1 tablet by mouth daily.   naproxen sodium 220 MG tablet Commonly known as: ALEVE Take 220 mg by mouth daily as  needed (for pain).   ondansetron 4 MG tablet Commonly known as: ZOFRAN Take 1 tablet (4 mg total) by mouth every 6 (six) hours as needed for nausea or vomiting.   pantoprazole 40 MG tablet Commonly known as: PROTONIX Take 1 tablet (40 mg total) by mouth at bedtime.   polyethylene glycol 17 g packet Commonly known as: MIRALAX / GLYCOLAX Take 17 g by mouth daily as needed for mild constipation.   protein supplement Liqd Take 30 mLs by mouth daily.   Salonpas DEEP Relieving 3.04-03-13 % Gel Generic drug: Camphor-Menthol-Methyl Sal Apply 1 application topically daily as needed (For back pain).   senna-docusate 8.6-50 MG tablet Commonly known as: Senokot-S Take 2 tablets by mouth 2 (two) times daily.   traMADol 50 MG tablet Commonly known as: ULTRAM Take 1 tablet (50 mg total) by mouth every 6 (six) hours as needed (For pain.).   Vitamin D 50 MCG (2000 UT) tablet Take 1 tablet (2,000 Units total) by mouth daily.     ASK your doctor about these medications   oxyCODONE-acetaminophen 5-325 MG tablet Commonly known as: PERCOCET/ROXICET Take 1 tablet by mouth every 4 (four) hours as needed for severe pain.   sulfamethoxazole-trimethoprim 800-160 MG tablet Commonly known as: BACTRIM DS Take 1 tablet by mouth 2 (two) times daily for 7 days. Ask about: Should I take this medication?      Late entry 05/09/20 for 05/06/20  Signed: Dorian Heckle, MD 05/09/2020, 7:47 AM

## 2020-05-10 DIAGNOSIS — G479 Sleep disorder, unspecified: Secondary | ICD-10-CM

## 2020-05-10 MED ORDER — MELATONIN 3 MG PO TABS
3.0000 mg | ORAL_TABLET | Freq: Every day | ORAL | Status: DC
  Administered 2020-05-10 – 2020-05-30 (×21): 3 mg via ORAL
  Filled 2020-05-10 (×21): qty 1

## 2020-05-10 NOTE — Progress Notes (Signed)
Orthopedic Tech Progress Note Patient Details:  Timothy Ware 1934/06/21 [id number] Called in order to [facility] for a [facility] PRAFO BOOT Patient ID: [name], male   DOB: 03/17/1935, 86 y.o.   MRN: [id number]   Donald Pore 05/10/2020, 8:21 AM

## 2020-05-10 NOTE — Progress Notes (Signed)
PROGRESS NOTE   Subjective/Complaints: Patient seen sitting up in bed this morning.  He states he did not sleep well overnight.  Noted to have two bottles of [facility] next to his bed, patient states he sips on them periodically.  Discussed avoiding caffeine before bedtime and throughout the night.  ROS: Denies CP, SOB, N/V/D  Objective:   No results found. Recent Labs    05/08/20 0501  WBC 9.8  HGB 9.6*  HCT 28.1*  PLT 283   Recent Labs    05/08/20 0501  NA 135  K 4.3  CL 101  CO2 25  GLUCOSE 112*  BUN 14  CREATININE 1.19  CALCIUM 8.6*    Intake/Output Summary (Last 24 hours) at 05/10/2020 1033 Last data filed at 05/10/2020 4010 Gross per 24 hour  Intake 417 ml  Output 2725 ml  Net -2308 ml        Physical Exam: Vital Signs Blood pressure 131/63, pulse 85, temperature 98.2 F (36.8 C), temperature source Oral, resp. rate 16, height 6\' 1"  (1.854 m), weight 74.2 kg, SpO2 98 %.  Constitutional: No distress . Vital signs reviewed. HENT: Normocephalic.  Atraumatic. Eyes: EOMI. No discharge. Cardiovascular: No JVD.  RRR. Respiratory: Normal effort.  No stridor.  Bilateral clear to auscultation. GI: Non-distended.  BS +. Skin: Warm and dry.   Back incision with dressing CDI. Psych: Normal mood.  Normal behavior. Musc: No edema in extremities.  No tenderness in extremities. Neuro: Alert Motor: B/l UE: 5/5 proximal to distal LLE: 4+/5 proximal to distal, stable RLE: HF, KE 4-/5, ADF 4+/5, stable Sensation diminished to light touch RLE  Assessment/Plan: 1. Functional deficits which require 3+ hours per day of interdisciplinary therapy in a comprehensive inpatient rehab setting.  Physiatrist is providing close team supervision and 24 hour management of active medical problems listed below.  Physiatrist and rehab team continue to assess barriers to discharge/monitor patient progress toward functional and  medical goals  Care Tool:  Bathing  Bathing activity did not occur: Refused           Bathing assist       Upper Body Dressing/Undressing Upper body dressing Upper body dressing/undressing activity did not occur (including orthotics): Refused What is the patient wearing?: Pull over shirt,Orthosis Orthosis activity level: Performed by helper  Upper body assist Assist Level: Minimal Assistance - Patient > 75% (Supevision pull over shirt, Min A to include TLSO)    Lower Body Dressing/Undressing Lower body dressing    Lower body dressing activity did not occur: Refused What is the patient wearing?: Pants     Lower body assist Assist for lower body dressing: Total Assistance - Patient < 25%     Toileting Toileting Toileting Activity did not occur and hygiene only): N/A (no void or bm)  Toileting assist Assist for toileting: Minimal Assistance - Patient > 75%     Transfers Chair/bed transfer  Transfers assist  Chair/bed transfer activity did not occur: Refused  Chair/bed transfer assist level: Moderate Assistance - Patient 50 - 74% (squat pivot)     Locomotion Ambulation   Ambulation assist   Ambulation activity did not occur: Safety/medical concerns (  pain and unable to stand)          Walk 10 feet activity   Assist  Walk 10 feet activity did not occur: Safety/medical concerns        Walk 50 feet activity   Assist Walk 50 feet with 2 turns activity did not occur: Safety/medical concerns         Walk 150 feet activity   Assist Walk 150 feet activity did not occur: Safety/medical concerns         Walk 10 feet on uneven surface  activity   Assist Walk 10 feet on uneven surfaces activity did not occur: Safety/medical concerns         Wheelchair     Assist Will patient use wheelchair at discharge?: Yes Type of Wheelchair: Manual    Wheelchair assist level: Supervision/Verbal cueing Max wheelchair  distance: 150    Wheelchair 50 feet with 2 turns activity    Assist        Assist Level: Supervision/Verbal cueing   Wheelchair 150 feet activity     Assist      Assist Level: Supervision/Verbal cueing   Medical Problem List and Plan: 1.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 Status post right transpedicular decompression microdiscectomy T10-11 with pedicle screw fixation T10-12 with posterior lateral arthrodesis 05/02/2020. Back brace when out of bed  Continue CIR  Team conference today to discuss current and goals and coordination of care, home and environmental barriers, and discharge planning with nursing, case manager, and therapies. Please see conference note from today as well.   Discussed with therapies-PRAFO ordered 2. Antithrombotics: -DVT/anticoagulation: Xarelto  Left femoral and popliteal DVT -antiplatelet therapy: N/A 3. Pain Management:Baclofen 10 mg 3 times daily, hydrocodone as needed. Robaxin and Anaprox as needed  Controlled with meds on 2/16  Monitor with increased mobility 4. Mood:Provide emotional support -antipsychotic agents: N/A 5. Neuropsych: This patientiscapable of making decisions on hisown behalf. 6. Skin/Wound Care: Routine skin care  7. Fluids/Electrolytes/Nutrition:Routine in and outs 8. Hypertension. Tenormin 25 mg daily.   Controlled for the most part on 2/16  Monitor with increased mobility 9. Neurogenic bowel andbladder.   PVRs relatively unremarkable  Adjust bowel meds as necessary - improving  10. Hyperlipidemia. Lipitor 11. CKD stage III. Creatinine baseline 1.27.   Creatinine 1.19 on 2/14 12. Nausea:?  Resolved 13.  Sleep disturbance  Melatonin started on 2/16   LOS: 4 days A FACE TO FACE EVALUATION WAS PERFORMED  Timothy Ware Timothy Ware 05/10/2020, 10:33 AM

## 2020-05-10 NOTE — Progress Notes (Incomplete)
Patient resting in bed verbalizes discomfort of right leg pain and spasms, ' it gets so bad sometimes I can hardly take it". Provided prn Tylenol and Robaxin, assisted with repositioning for comfort, made as comfotable as possible,

## 2020-05-10 NOTE — Progress Notes (Signed)
Physical Therapy Session Note  Patient Details  Name: Timothy Ware MRN: [id number] Date of Birth: [birth year]  Today's Date: 05/10/2020 PT Individual Time: 8527-7824 PT Individual Time Calculation (min): 69 min   Short Term Goals: Week 1:  PT Short Term Goal 1 (Week 1): Pt will be able to perform min assist squat pivot transfers bed <> w/c PT Short Term Goal 2 (Week 1): Pt will be able to perform sit <> stand with max assist PT Short Term Goal 3 (Week 1): Pt will be able to tolerate OOB in chair x 2 hours a day  Skilled Therapeutic Interventions/Progress Updates:   Received pt transferring to commode with NT via Stedy, PT took over with care and pt transferred sit<>stand from stedy flaps with CGA and required total A to doff pants. Stand<>sitting on commode with min A for eccentric descent. Pt stating "Oh lord, my back". Therapist asked pt to rate pain level and pt stated "it hurts, it's up there with the big numbers" but did not state pain level. Repositioning, rest breaks, and distraction done to reduce pain levels as pt reported receiving pain medication 1 hour prior. Session with emphasis on functional mobility/transfers, toileting, generalized strengthening, dynamic standing balance/coordination, and improved activity tolerance. TLSO donned during session. Pt continent of bowel and and bladder and required total A for peri-care in standing. Pt with tendency to lean forward onto stedy bars due to back pain. Doffed PRAFO and donned regular shoes with total A and transported to ortho gym in [facility] total A for energy conservation purposes. Pt performed BUE strengthening on UBE at level 2 for 2 minutes forward and 2 minutes backwards with no increase in LBP. Pt declined performing simulated car transfer stating "that's not a real car, that's nothing". Pt transported to dayroom and required 3 attempts and heavy max A to come into standing with RW. Pt required cues for hand placement on RW and WC armrests and  anterior weight shifting when standing. Pt with significant RLE weakness requiring total A to block R knee from buckling. Pt required mod verbal cues for upright posture/gaze and bilateral knee extension. Pt able to remain standing ~40 seconds prior to sitting. Pt performed the following exercises sitting in WC: -hip flexion x20 on LLE with 2lb ankle weight and x10 AAROM on RLE -hip adduction ball squeezes x10 with 5 second isometric hold -LAQ x20 on LLE with 2lb ankle weight and x10 on RLE AAROM -WC pushups x8 Pt transported back to room in [facility] total A and requested to return to bed. Squat<>pivot/multiple lateral scoots from WC<>bed with mod A and total A to manage RLE. Pt performed small lateral scoots with HOB with total A to manage RLE and doffed TLSO total A. Sit<>supine with mod A for RLE management while maintaining back precautions. Concluded session with pt supine in bed, needs within reach, and bed alarm on. Therapist provided fresh ice water for pt.   Therapy Documentation Precautions:  Precautions Precautions: Fall,Back Precaution Comments: Back precautions, TLSO EOB Required Braces or Orthoses: Spinal Brace Spinal Brace: Thoracolumbosacral orthotic,Applied in sitting position Restrictions Weight Bearing Restrictions: No  Therapy/Group: Individual Therapy Martin Majestic PT, DPT   05/10/2020, 7:24 AM

## 2020-05-10 NOTE — Progress Notes (Signed)
Occupational Therapy Session Note  Patient Details  Name: Timothy Ware MRN: [id number] Date of Birth: [birth year]  Today's Date: 05/10/2020 OT Individual Time: 4431-5400 and 8676-1950  OT Individual Time Calculation (min): 47 min and 60 min (missed 13 and 15 min)   Short Term Goals: Week 1:  OT Short Term Goal 1 (Week 1): Pt will use LRAD to don LB clothing with mod A OT Short Term Goal 2 (Week 1): Pt will don shirt with min A OT Short Term Goal 3 (Week 1): Pt will complete BSC transfer with mod A  Skilled Therapeutic Interventions/Progress Updates:    1) Treatment session with focus on therapeutic activity and BUE strengthening and endurance.  Pt received supine in bed reporting pain in back and not sleeping well overnight.  Pt stated frequency of wakings either due to urination or legs "jumping".  Pt declined any self-care tasks this AM or getting to EOB.  Pt agreeable to BUE strengthening activities at bed level.  Therapist provided pt with level 2 (orange) theraband, completing 2 sets of 10 overhead presses, elbow extension, and chest presses.  Pt reports requiring increased effort to raise LUE over head due to previous rotator cuff injury but able to complete all exercises with increased time.  Pt requested to rest before next therapy session.  Therefore pt missed 13 mins OT session due to pt request to rest.  2) Treatment session with focus on functional mobility, strengthening, and endurance.  Pt received supine in bed reporting BUE weakness but agreeable to therapy session.  Engaged in bed mobility with min-mod assist.  Therapist placed TLSO while pt seated EOB.  Engaged in sit > stand from slightly elevated EOB with mod assist and heavy reliance on RW.  Pt able to stand 40-45 seconds each attempt with CGA while standing.  Pt favoring WB through LLE, requiring cues to increase weight through RLE.  Pt completed sit > stand x5 with mod assist each time.  Therapist challenged pt to attempt to  lift RLE with pt able to move it side to side and forward backward but unable to "step" on to RLE and unweight LLE.  Pt completed lateral scoot transfer to w/c with min assist and increased time.  Pt propelled w/c >180' with supervision.  Engaged in BUE strengthening with 2# dowel rod with focus on strengthening and endurance within pain tolerance.  Pt tolerated sitting OOB ~30 mins before returning back to bed.  Min assist lateral scoots w/c > bed and then returned to supine with assist to lift RLE in to bed.  Pt remained supine in bed with all needs in reach.  Therapy Documentation Precautions:  Precautions Precautions: Fall,Back Precaution Comments: Back precautions, TLSO EOB Required Braces or Orthoses: Spinal Brace Spinal Brace: Thoracolumbosacral orthotic,Applied in sitting position Restrictions Weight Bearing Restrictions: No Pain:  Pt reports pain 4/10 in back "at rest" and increases to "higher than 10" with movement.  RN aware.   Therapy/Group: Individual Therapy  Rosalio Loud 05/10/2020, 8:22 AM

## 2020-05-10 NOTE — Patient Care Conference (Signed)
Inpatient RehabilitationTeam Conference and Plan of Care Update Date: 05/10/2020   Time: 12:00 PM    Patient Name: Timothy Ware      Medical Record Number: [id number]  Date of Birth: [birth year] Sex: Male         Room/Bed: 5J88C/1Y60Y-30 Payor Info: Payor: MEDICARE / Plan: MEDICARE PART A AND B / Product Type: *No Product type* /    Admit Date/Time:  05/06/2020  2:37 PM  Primary Diagnosis:  Thoracic myelopathy  Hospital Problems: Principal Problem:   Thoracic myelopathy Active Problems:   Stage 3a chronic kidney disease (HCC)   Neurogenic bowel   Acute deep vein thrombosis (DVT) of femoral vein of left lower extremity (HCC)   Sleep disturbance    Expected Discharge Date: Expected Discharge Date: 05/31/20  Team Members Present: Physician leading conference: Dr. Maryla Morrow Care Coodinator Present: Chana Bode, RN, BSN, CRRN;Becky Dupree, LCSW Nurse Present: Konrad Dolores, RN PT Present: Raechel Chute, PT OT Present: Rosalio Loud, OT PPS Coordinator present : Fae Pippin, SLP     Current Status/Progress Goal Weekly Team Focus  Bowel/Bladder   Patient is Continent of bladder and bowel, LBM 05/09/20  Maitnatin continence  Assess toileting needs QS/PRN   Swallow/Nutrition/ Hydration             ADL's   Mod assist bed mobility and squat pivot/lateral scoot transfers, Total assist LB dressing at sit > stand level in [place], Supervision to don shirt - requires assist to don TLSO by helper  Min A toileting, CGA transfers and bathing/dressing  ADL retraining, transfers, sit > stand, dynamic standing balance, activity tolerance/endurance, pain management   Mobility   mod assist scoot/squat pivot, supervision w/c propulsion, min assist bed mobility with rails  mod I bed mobility, supervision transfers, mod A standing, mod I WC mobility  functional mobility/transfers, generalized strengthening, dynamic standing balance/coordination, endurance   Communication              Safety/Cognition/ Behavioral Observations            Pain   C/O pain to BLE's and surgical back, prn continue, increase discomfort secondary to jerking episodes to BLE  ,< 2  Assess QS/PRN,   Skin   Surgical dressing to back intact no drainage, otherwise skin intact           Discharge Planning:  Home with wife who has back issues of her own, son and daughter's to assist. Left here last time at mod/i-supervision wheelchair level   Team Discussion: MD monitoring medical issues and Xarelto started for new DVT. Slow progress; progress impaired by pain, restless legs, poor motivation, fear and anxiety. Patient on target to meet rehab goals: yes, making slow progress. Currently supervision for w/c mobility. Ambulation limited by knee buckling and anxiety when standing. Goals set for mod I for PT and min assist bathing and dressing goals  *See Care Plan and progress notes for long and short-term goals.   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  Status post right transpedicular decompression microdiscectomy T10-11 with pedicle screw fixation T10-12 with posterior lateral arthrodesis 05/02/2020  Barriers to Discharge: Medical stability;Decreased family/caregiver support   Possible Resolutions to [employer] Focus: Therapies, optimize sleep meds, Follow labs - Cr, optimize BP  meds, Xarelto for DVT   Continued Need for Acute Rehabilitation Level of Care: The patient requires daily medical management by a physician with specialized training in physical medicine and rehabilitation for the following reasons: Direction of a multidisciplinary physical  rehabilitation program to maximize functional independence : Yes Medical management of patient stability for increased activity during participation in an intensive rehabilitation regime.: Yes Analysis of laboratory values and/or radiology reports with any subsequent need for medication adjustment and/or medical intervention. : Yes   I attest that I was present, lead the team conference, and concur with the assessment and plan of the team.   Chana Bode B 05/10/2020, 1:16 PM

## 2020-05-10 NOTE — Progress Notes (Signed)
Patient ID: Timothy Ware, male   DOB: 07-15-1934, 86 y.o.   MRN: [id number]  Met with pt and spoke with wife via telephone to discuss team conference goals supervsion-min assist level and target discharge date 3/9. Wife feels it is too much for her and wants their children to be able to visit pt. She plans to call administration regarding this. informied her the visitor can be changed form her to one their children. She wants to switch off. Aware of hospital policy of one visitor. She reports the goals are just the same as when he was here before. Aware pain is limiting him and he will need to push himself. He is weaker than he as before with his second surgery. Will work on discharge plan and provide support to both pt and wife.

## 2020-05-11 MED ORDER — GABAPENTIN 600 MG PO TABS
300.0000 mg | ORAL_TABLET | Freq: Every day | ORAL | Status: DC
  Administered 2020-05-11 – 2020-05-30 (×20): 300 mg via ORAL
  Filled 2020-05-11 (×20): qty 1

## 2020-05-11 NOTE — Progress Notes (Signed)
PROGRESS NOTE   Subjective/Complaints: Patient seen laying in bed this morning.  He states he slept better overnight with combination of medications.  He is tolerating his PRAFO.  He has questions regarding DVT and dislodging of clot-reassured patient.  ROS: Denies CP, SOB, N/V/D  Objective:   No results found. No results for input(s): WBC, HGB, HCT, PLT in the last 72 hours. No results for input(s): NA, K, CL, CO2, GLUCOSE, BUN, CREATININE, CALCIUM in the last 72 hours.  Intake/Output Summary (Last 24 hours) at 05/11/2020 1223 Last data filed at 05/11/2020 0900 Gross per 24 hour  Intake 840 ml  Output 1250 ml  Net -410 ml        Physical Exam: Vital Signs Blood pressure (!) 113/50, pulse 77, temperature 98.5 F (36.9 C), resp. rate 18, height 6\' 1"  (1.854 m), weight 74.2 kg, SpO2 99 %.  Constitutional: No distress . Vital signs reviewed. HENT: Normocephalic.  Atraumatic. Eyes: EOMI. No discharge. Cardiovascular: No JVD.  RRR. Respiratory: Normal effort.  No stridor.  Bilateral clear to auscultation. GI: Non-distended.  BS +. Skin: Warm and dry.  Intact. Psych: Normal mood.  Normal behavior. Musc: No edema in extremities.  No tenderness in extremities. Neuro: Alert Motor: B/l UE: 5/5 proximal to distal LLE: 4+/5 proximal to distal, unchanged RLE: HF, KE 4-/5, ADF 4+/5, unchanged Sensation diminished to light touch RLE  Assessment/Plan: 1. Functional deficits which require 3+ hours per day of interdisciplinary therapy in a comprehensive inpatient rehab setting.  Physiatrist is providing close team supervision and 24 hour management of active medical problems listed below.  Physiatrist and rehab team continue to assess barriers to discharge/monitor patient progress toward functional and medical goals  Care Tool:  Bathing  Bathing activity did not occur: Refused           Bathing assist       Upper Body  Dressing/Undressing Upper body dressing Upper body dressing/undressing activity did not occur (including orthotics): Refused What is the patient wearing?: Pull over shirt,Orthosis Orthosis activity level: Performed by helper  Upper body assist Assist Level: Minimal Assistance - Patient > 75% (Supevision pull over shirt, Min A to include TLSO)    Lower Body Dressing/Undressing Lower body dressing    Lower body dressing activity did not occur: Refused What is the patient wearing?: Pants     Lower body assist Assist for lower body dressing: Total Assistance - Patient < 25%     Toileting Toileting Toileting Activity did not occur and hygiene only): N/A (no void or bm)  Toileting assist Assist for toileting: Minimal Assistance - Patient > 75%     Transfers Chair/bed transfer  Transfers assist  Chair/bed transfer activity did not occur: Refused  Chair/bed transfer assist level: Moderate Assistance - Patient 50 - 74% (squat<>pivot)     Locomotion Ambulation   Ambulation assist   Ambulation activity did not occur: Safety/medical concerns (pain and unable to stand)          Walk 10 feet activity   Assist  Walk 10 feet activity did not occur: Safety/medical concerns        Walk 50 feet activity  Assist Walk 50 feet with 2 turns activity did not occur: Safety/medical concerns         Walk 150 feet activity   Assist Walk 150 feet activity did not occur: Safety/medical concerns         Walk 10 feet on uneven surface  activity   Assist Walk 10 feet on uneven surfaces activity did not occur: Safety/medical concerns         Wheelchair     Assist Will patient use wheelchair at discharge?: Yes Type of Wheelchair: Manual    Wheelchair assist level: Supervision/Verbal cueing Max wheelchair distance: 150    Wheelchair 50 feet with 2 turns activity    Assist        Assist Level: Supervision/Verbal cueing    Wheelchair 150 feet activity     Assist      Assist Level: Supervision/Verbal cueing   Medical Problem List and Plan: 1.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 Status post right transpedicular decompression microdiscectomy T10-11 with pedicle screw fixation T10-12 with posterior lateral arthrodesis 05/02/2020. Back brace when out of bed  Continue CIR  Discussed with therapies-PRAFO nightly 2. Antithrombotics: -DVT/anticoagulation: Xarelto  Left femoral and popliteal DVT -antiplatelet therapy: N/A 3. Pain Management:Baclofen 10 mg 3 times daily, hydrocodone as needed. Robaxin as needed  Gabapentin 300 nightly started on 2/17  Controlled with meds on 2/17  Monitor with increased mobility 4. Mood:Provide emotional support -antipsychotic agents: N/A 5. Neuropsych: This patientiscapable of making decisions on hisown behalf. 6. Skin/Wound Care: Routine skin care  7. Fluids/Electrolytes/Nutrition:Routine in and outs 8. Hypertension. Tenormin 25 mg daily.   Controlled on 2/17  Monitor with increased mobility 9. Neurogenic bowel andbladder.   PVRs relatively unremarkable  Adjust bowel meds as necessary - improving  10. Hyperlipidemia. Lipitor 11. CKD stage III. Creatinine baseline 1.27.   Creatinine 1.19 on 2/14 12. Nausea:?  Resolved 13.  Sleep disturbance  Melatonin started on 2/16  Appears to be improving   LOS: 5 days A FACE TO FACE EVALUATION WAS PERFORMED  Shamona Wirtz [name] 05/11/2020, 12:23 PM

## 2020-05-11 NOTE — Progress Notes (Signed)
Physical Therapy Session Note  Patient Details  Name: Timothy Ware MRN: [id number] Date of Birth: 1934-07-04  Today's Date: 05/11/2020 PT Individual Time: 1346-1440 PT Individual Time Calculation (min): 54 min   Short Term Goals: Week 1:  PT Short Term Goal 1 (Week 1): Pt will be able to perform min assist squat pivot transfers bed <> w/c PT Short Term Goal 2 (Week 1): Pt will be able to perform sit <> stand with max assist PT Short Term Goal 3 (Week 1): Pt will be able to tolerate OOB in chair x 2 hours a day  Skilled Therapeutic Interventions/Progress Updates:   Received pt sidelying in bed, pt agreeable to therapy with convincing, and reported "dull ache" in low back at rest. Pt reported fatigue from therapies this morning stating he had them back to back. Repositioning, rest breaks, and distraction done to reduce pain levels. Session with emphasis on functional mobility/transfers, generalized strengthening, dynamic standing balance/coordination, and improved activity tolerance. Pt transferred supine<>sitting EOB with mod A and assist to manage bilateral LEs. Donned TLSO and shoes sitting EOB with total A. Pt then began to c/o of increased low back pain and stated "can't we just stay in here?" Therapist encouraged pt to attempt standing and pt agreed; transferred sit<>stand with RW and mod A from elevated bed. Pt with no knee buckling but with bilateral knee flexion in stance and immediately requested to sit. Pt with poor mechanics when standing pulling with both UEs on RW despite cues. Pt stating "that thing hurts" numerous times, referring to his back brace and fixated on low back pain throughout session stating "oh lord, it hurts" over and over. Pt reported increased low back pain to 8/10 and transferred sit<>R sidelying with mod A for BLE management. RN notified and present to administer medication during session. Pt requested to sit EOB to take meds and transferred supine<>sit and sit<>supine  in same manner listed above. Pt performed the following exercises in supine with verbal cues for technique: -heel slides x10 on LLE -hip abduction x10 bilaterally (AAROM) -hooklying hip abd/add x9. Pt required manual facilitation to maintain R knee in hooklying and demonstrated poor eccentric control with R hip abduction -pelvic tilts 2x8 with manual facilitation to maintain RLE hooklying position -ankle circles x20 clockwise and counterclockwise on LLE and x9 clockwise and counterclockwise on RLE but with increased difficulty achieving full ROM -glute squeezes x10  Pt reported tightness along bilateral calves. Attempted to perform passive stretch to bilateral heel cords, however pt unable to relax for stretch. Pt hypersensitive to touch on plantar surface of RLE when therapist lightly touched and when blanket touched his foot. Pt able to scoot to [facility] with max A to maintain RLE hooklying position and pt able to pull himself up using headboard. Concluded session with pt supine in bed, needs within reach, and bed alarm on.  Therapy Documentation Precautions:  Precautions Precautions: Fall,Back Precaution Comments: Back precautions, TLSO EOB Required Braces or Orthoses: Spinal Brace Spinal Brace: Thoracolumbosacral orthotic,Applied in sitting position Restrictions Weight Bearing Restrictions: No  Therapy/Group: Individual Therapy Martin Majestic PT, DPT   05/11/2020, 7:26 AM

## 2020-05-11 NOTE — Progress Notes (Signed)
Occupational Therapy Session Note  Patient Details  Name: Timothy Ware MRN: [id number] Date of Birth: [birth year]  Today's Date: 05/11/2020  Session 1 OT Individual Time: 5625-6389 OT Individual Time Calculation (min): 45 min   Session 2 OT Individual Time: 1145-1230 OT Individual Time Calculation (min): 45 min    Short Term Goals: Week 1:  OT Short Term Goal 1 (Week 1): Pt will use LRAD to don LB clothing with mod A OT Short Term Goal 2 (Week 1): Pt will don shirt with min A OT Short Term Goal 3 (Week 1): Pt will complete BSC transfer with mod A  Skilled Therapeutic Interventions/Progress Updates:    Session 1 Pt greeted semi-reclined in bed and agreeable to OT treatment session. Pt completed log roll to get EOB with min A. Once seated EOB, noted bed and pants to be wet. Pt agreeable to washing up and changing clothes. TLSO donned seated EOB with min A.  Sit<>stand in St[place] from slightly raised bed with CGA. Pt brought to the sink in [place] and worked on sit<>stands and standing endurance while in [place]. OT provided total A to wash buttocks and peri-area as pt unable to remove a hand from the stedy and maintain standing balance. Worked on LB dressing strategies with pt unable to tolerate figure 4 position requiring max A from OT. CGA sit<>stand from lower wc seat. Set-up for UB bathing, then min A for UB dressing as pt had difficulty reaching around to get button up sleeve. Pt donned TLSO again in wc with min A. Pt agreeable to try sitting up in recliner and completed stedy transfer over to recliner with CGA. Pt left seated in recliner with alarm pad, call bell in reach, and needs met.   Session 2 Pt greeted semi-reclined in bed and agreeable to OT treatment session. Pt completed bed mobility with OT assist to move R LE off of bed, and min A to elevate trunk. OT assist to don shoes at EOB. Worked on donning TLSO at EOB with pt needing OT assist to overlap brace in the front. Pt then completed  lateral scooting transfer from bed to drop arm wc with min A. Worked on wc propulsion to therapy gym with supervision. UB there-ex on [employer] arm bike for 6 mins x2. Pt took rest break in between. Pt propelled wc back to room and completed lateral scoot back to bed with CGA. Pt returned to bed with OT assist to lift LEs. Pt left semi-reclined bed with lunch set-up and needs met..  Therapy Documentation Precautions:  Precautions Precautions: Fall,Back Precaution Comments: Back precautions, TLSO EOB Required Braces or Orthoses: Spinal Brace Spinal Brace: Thoracolumbosacral orthotic,Applied in sitting position Restrictions Weight Bearing Restrictions: No Pain:  Pt reports pain in back, but no number given   Therapy/Group: Individual Therapy  Valma Cava 05/11/2020, 12:43 PM

## 2020-05-11 NOTE — Progress Notes (Signed)
Physical Therapy Session Note  Patient Details  Name: Timothy Ware MRN: [id number] Date of Birth: Aug 07, 1934  Today's Date: 05/11/2020 PT Individual Time: 1030-1130 PT Individual Time Calculation (min): 60 min   Short Term Goals: Week 1:  PT Short Term Goal 1 (Week 1): Pt will be able to perform min assist squat pivot transfers bed <> w/c PT Short Term Goal 2 (Week 1): Pt will be able to perform sit <> stand with max assist PT Short Term Goal 3 (Week 1): Pt will be able to tolerate OOB in chair x 2 hours a day  Skilled Therapeutic Interventions/Progress Updates:     Patient in recliner with TLSO donned in the room upon PT arrival. Patient alert and agreeable to PT session. Patient reported 6-7/10 low back pain during session, RN made aware. PT provided repositioning, rest breaks, and distraction as pain interventions throughout session.   Therapeutic Activity: Bed Mobility: Patient performed sit to supine with min A for lower extremity management in a flat bed with use of bed rail. Doffed TLSO with patient sitting EOB prior to lying down with mod A. Provided verbal cues for log roll technique to maintain spinal precautions. Transfers: Patient performed sit to/from stand using Stedy for toilet transfer with min A-CGA. Provided cues for forward weight shift, flat back to maintain spinal precautions, and increased gluteal and quad activation for full lower extremity extension. Patient performed lateral scoot transfers w/c<>NuStep seat and w/c>bed with min A. Provided cues for hand placement, flat back to maintain spinal precautions and head-hips relationship for proper technique and decreased assist with transfers.   Neuromuscular Re-ed: Patient performed the following lower extremity motor control activities: -NuStep 2x4 min focused on continuous L foot contact with controlled movement through AROM and adductor activation for hip internal rotation to neutral and increased motor recruitment on  for pushing through R lower extremity, provided facilitation at patient's lateral knee and quads on R leg throughout  Patient in bed at end of session with breaks locked, bed alarm set, and all needs within reach.    Therapy Documentation Precautions:  Precautions Precautions: Fall,Back Precaution Comments: Back precautions, TLSO EOB Required Braces or Orthoses: Spinal Brace Spinal Brace: Thoracolumbosacral orthotic,Applied in sitting position Restrictions Weight Bearing Restrictions: No   Therapy/Group: Individual Therapy  Timothy Ware [name] PT, DPT  05/11/2020, 4:24 PM

## 2020-05-12 DIAGNOSIS — D62 Acute posthemorrhagic anemia: Secondary | ICD-10-CM

## 2020-05-12 NOTE — Progress Notes (Signed)
Physical Therapy Session Note  Patient Details  Name: Timothy Ware MRN: [id number] Date of Birth: Apr 22, 1934  Today's Date: 05/12/2020 PT Individual Time: 1000-1054 and 1345-1443 PT Individual Time Calculation (min): 54 min and 58 min  Short Term Goals: Week 1:  PT Short Term Goal 1 (Week 1): Pt will be able to perform min assist squat pivot transfers bed <> w/c PT Short Term Goal 2 (Week 1): Pt will be able to perform sit <> stand with max assist PT Short Term Goal 3 (Week 1): Pt will be able to tolerate OOB in chair x 2 hours a day  Skilled Therapeutic Interventions/Progress Updates:   Treatment Session 1: 1000-1054 54 min Received pt sitting in WC, pt agreeable to therapy, and reported "dull ache" rated 3/10 in low back (premedicated) but c/o low back pain throughout session. Repositioning, rest breaks, and distraction done to reduce pain levels. Session with emphasis on functional mobility/transfers, generalized strengthening, dynamic standing balance/coordination, toileting, and improved activity tolerance. Pt performed WC mobility 186ft using BUE and supervision to dayroom and transferred WC<>mat via lateral scoot with CGA. Pt transferred sit<>stand with RW and mod A on trial 1 and min A on trial 2 with therapist blocking R knee from buckling. Pt putting all his weight on LLE and unable to weight shift onto RLE without buckling stating "that leg's got nothing". Pt performed the following exercises sitting on mat: -L hip flexion 2x10  -RLE active assisted towel heel slides 2x10 -R ankle active assisted towel inversion/eversion 2x12 -L LAQ 2x10 -hip adduction ball squeezes 2x12 -towel crunches with RLE with emphasis on intrinsic foot muscle activation 2x30 seconds -chest press with 2lb dowel x5 Pt then reported urge to use restroom and transferred mat<>WC via lateral scoot with CGA and transported back to room in WC total A. Pt declined scooting onto commode and requested to use stedy onto  regular toilet. Pt transferred sit<>stand in stedy with CGA and transported to toilet dependently. Pt required total A to doff pants but ultimately unsuccessful. Sit<>stand with CGA and total A for clothing management. Dependent transfer to bed with stedy and doffed TLSO and shoes with total A. Sit<>supine with min A for RLE management. Concluded session with pt supine in bed, needs within reach, and bed alarm on.   Treatment Session 2: 1345-1443 58 min Received pt sidelying in bed reporting cramping in stomach and requesting to use restroom, pt agreeable to therapy, and reported pain 7/10 in low back with mobility (premedicated). Repositioning, rest breaks, and distraction done to reduce pain levels. Session with emphasis on functional mobility/transfers, toileting, generalized strengthening, dynamic sitting balance/coordination, simulated car transfers, and improved activity tolerance. Pt insisted on using stedy to toilet due to urgency and transferred sidelying<>sitting EOB with min A for RLE management and donned TLSO and shoes sitting EOB with total A. Pt able to assist in pushing RLE into shoe. Sit<>stand in stedy with close supervision and dependent transfer to toilet. Pt required total A for clothing management and able to void and with medium loose BM. Pt required total A for peri-care in standing and transferred dependently to WC. Pt performed WC mobility >344ft using BUE and supervision to ortho gym. With convincing, pt performed simulated car transfer via lateral scoot with min A and max A to manage RLE. Pt required min cues for transfer setup and WC safety. Discussed equipment pt already has at home. Pt reported having WC, RW, standard walker, and multiple canes but is hopefull to leave using  RW. Discussed pt's current condition and strength of R leg, ultimately recommending that pt not stand without assist from therapist. Pt performed BUE strengthening on UBE at level 3.5 for 4.5 minutes forward and  4.5 minutes backwards with no rest breaks. Pt transported back to room in [employer] total A and transferred WC<>bed via lateral scoot with CGA and doffed TLSO and shoes with total A. Sit<>supine with min A for RLE management. Concluded session with pt supine in bed, needs within reach, and bed alarm on.   Therapy Documentation Precautions:  Precautions Precautions: Fall,Back Precaution Comments: Back precautions, TLSO EOB Required Braces or Orthoses: Spinal Brace Spinal Brace: Thoracolumbosacral orthotic,Applied in sitting position Restrictions Weight Bearing Restrictions: No  Therapy/Group: Individual Therapy Martin Majestic PT, DPT   05/12/2020, 7:24 AM

## 2020-05-12 NOTE — Progress Notes (Signed)
PROGRESS NOTE   Subjective/Complaints: Patient seen laying in bed this morning.  States he slept well overnight.  He notes improvement in pain.  He states he has some muscle spasms this morning, reminded about medications.  ROS: Denies CP, SOB, N/V/D  Objective:   No results found. No results for input(s): WBC, HGB, HCT, PLT in the last 72 hours. No results for input(s): NA, K, CL, CO2, GLUCOSE, BUN, CREATININE, CALCIUM in the last 72 hours.  Intake/Output Summary (Last 24 hours) at 05/12/2020 0913 Last data filed at 05/12/2020 0500 Gross per 24 hour  Intake 600 ml  Output 1850 ml  Net -1250 ml        Physical Exam: Vital Signs Blood pressure 97/63, pulse 79, temperature 98.4 F (36.9 C), resp. rate 14, height 6\' 1"  (1.854 m), weight 74.2 kg, SpO2 96 %.  Constitutional: No distress . Vital signs reviewed. HENT: Normocephalic.  Atraumatic. Eyes: EOMI. No discharge. Cardiovascular: No JVD.  RRR. Respiratory: Normal effort.  No stridor.  Bilateral clear to auscultation. GI: Non-distended.  BS +. Skin: Warm and dry.  Intact. Psych: Normal mood.  Normal behavior. Musc: No edema in extremities.  No tenderness in extremities. Neuro: Alert Motor: B/l UE: 5/5 proximal to distal LLE: 4+/5 proximal to distal, stable RLE: HF, KE 4-/5, ADF 4+/5, stable Sensation diminished to light touch RLE  Assessment/Plan: 1. Functional deficits which require 3+ hours per day of interdisciplinary therapy in a comprehensive inpatient rehab setting.  Physiatrist is providing close team supervision and 24 hour management of active medical problems listed below.  Physiatrist and rehab team continue to assess barriers to discharge/monitor patient progress toward functional and medical goals  Care Tool:  Bathing  Bathing activity did not occur: Refused           Bathing assist       Upper Body Dressing/Undressing Upper body dressing  Upper body dressing/undressing activity did not occur (including orthotics): Refused What is the patient wearing?: Pull over shirt,Orthosis Orthosis activity level: Performed by helper  Upper body assist Assist Level: Minimal Assistance - Patient > 75% (Supevision pull over shirt, Min A to include TLSO)    Lower Body Dressing/Undressing Lower body dressing    Lower body dressing activity did not occur: Refused What is the patient wearing?: Pants     Lower body assist Assist for lower body dressing: Total Assistance - Patient < 25%     Toileting Toileting Toileting Activity did not occur and hygiene only): N/A (no void or bm)  Toileting assist Assist for toileting: Minimal Assistance - Patient > 75%     Transfers Chair/bed transfer  Transfers assist  Chair/bed transfer activity did not occur: Refused  Chair/bed transfer assist level: Moderate Assistance - Patient 50 - 74% (squat<>pivot)     Locomotion Ambulation   Ambulation assist   Ambulation activity did not occur: Safety/medical concerns (pain and unable to stand)          Walk 10 feet activity   Assist  Walk 10 feet activity did not occur: Safety/medical concerns        Walk 50 feet activity   Assist Walk 50 feet  with 2 turns activity did not occur: Safety/medical concerns         Walk 150 feet activity   Assist Walk 150 feet activity did not occur: Safety/medical concerns         Walk 10 feet on uneven surface  activity   Assist Walk 10 feet on uneven surfaces activity did not occur: Safety/medical concerns         Wheelchair     Assist Will patient use wheelchair at discharge?: Yes Type of Wheelchair: Manual    Wheelchair assist level: Supervision/Verbal cueing Max wheelchair distance: 150    Wheelchair 50 feet with 2 turns activity    Assist        Assist Level: Supervision/Verbal cueing   Wheelchair 150 feet activity     Assist       Assist Level: Supervision/Verbal cueing   Medical Problem List and Plan: 1.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 Status post right transpedicular decompression microdiscectomy T10-11 with pedicle screw fixation T10-12 with posterior lateral arthrodesis 05/02/2020. Back brace when out of bed  Continue CIR  PRAFO nightly 2. Antithrombotics: -DVT/anticoagulation: Xarelto  Left femoral and popliteal DVT -antiplatelet therapy: N/A 3. Pain Management:Baclofen 10 mg 3 times daily, hydrocodone as needed. Robaxin as needed  Gabapentin 300 nightly started on 2/17  Improving with meds on 2/18  Monitor with increased mobility 4. Mood:Provide emotional support -antipsychotic agents: N/A 5. Neuropsych: This patientiscapable of making decisions on hisown behalf. 6. Skin/Wound Care: Routine skin care  7. Fluids/Electrolytes/Nutrition:Routine in and outs 8. Hypertension. Tenormin 25 mg daily.   Overall controlled on 2/18  Monitor with increased mobility 9. Neurogenic bowel andbladder.   PVRs relatively unremarkable  Adjust bowel meds as necessary - improving  10. Hyperlipidemia. Lipitor 11. CKD stage III. Creatinine baseline 1.27.   Creatinine 1.19 on 2/14, labs ordered for Monday 12. Nausea:?  Resolved 13.  Sleep disturbance  Melatonin started on 2/16  Improving 14.  Acute blood loss anemia  Hemoglobin 9.6 on 2/14, labs ordered for Monday   LOS: 6 days A FACE TO FACE EVALUATION WAS PERFORMED  Timothy Ware [name] 05/12/2020, 9:13 AM

## 2020-05-12 NOTE — Plan of Care (Signed)
  Problem: Consults Goal: RH SPINAL CORD INJURY PATIENT EDUCATION Description:  See Patient Education module for education specifics.  Outcome: Progressing   Problem: RH SKIN INTEGRITY Goal: RH STG SKIN FREE OF INFECTION/BREAKDOWN Description: Assess skin q shift with min assist Outcome: Progressing Goal: RH STG ABLE TO PERFORM INCISION/WOUND CARE W/ASSISTANCE Description: STG Able To Perform Incision/Wound Care With Mod I Assistance. Outcome: Progressing   Problem: RH SAFETY Goal: RH STG ADHERE TO SAFETY PRECAUTIONS W/ASSISTANCE/DEVICE Description: STG Adhere to Safety Precautions With cues /reminder assistance/Device. Outcome: Progressing Goal: RH STG DECREASED RISK OF FALL WITH ASSISTANCE Description: STG Decreased Risk of Fall With cues/reminders Assistance. Outcome: Progressing   Problem: RH PAIN MANAGEMENT Goal: RH STG PAIN MANAGED AT OR BELOW PT'S PAIN GOAL Description: <2 Outcome: Progressing   Problem: RH KNOWLEDGE DEFICIT SCI Goal: RH STG INCREASE KNOWLEDGE OF SELF CARE AFTER SCI Description: Patient will be able to complete self care and direct others for care assistance needs independently at discharge using handouts and educational materials. Outcome: Progressing   

## 2020-05-12 NOTE — Progress Notes (Signed)
Occupational Therapy Session Note  Patient Details  Name: Timothy Ware MRN: [id number] Date of Birth: April 11, 1934  Today's Date: 05/12/2020 OT Individual Time: 6283-6629 OT Individual Time Calculation (min): 72 min    Short Term Goals: Week 1:  OT Short Term Goal 1 (Week 1): Pt will use LRAD to don LB clothing with mod A OT Short Term Goal 2 (Week 1): Pt will don shirt with min A OT Short Term Goal 3 (Week 1): Pt will complete BSC transfer with mod A  Skilled Therapeutic Interventions/Progress Updates:    Treatment session with focus on functional transfers, BUE strengthening, and activity tolerance. Pt received semi-reclined in bed reporting urinary incontinence overnight with RN assisting with hygiene, therefore pt refused any bathing/dressing this session.  Pt completed bed mobility with min assist to advance RLE to EOB.  Therapist donned TLSO seated EOB.  Pt completed lateral scoots transfer bed > w/c with min assist.  Pt propelled w/c to sink with supervision and engaged in grooming tasks with setup/supervision.  Pt propelled w/c to therapy gym for BUE strengthening.  Engaged in 2 sets of 10 chest presses and PNF pattern diagonals with 1Kg ball and 2 sets of 10 w/c pushups for BUE strengthening.  Pt required rest breaks between each set with c/o pain in back.  Pt remained upright in w/c with chair alarm on and all needs in reach.  Therapy Documentation Precautions:  Precautions Precautions: Fall,Back Precaution Comments: Back precautions, TLSO EOB Required Braces or Orthoses: Spinal Brace Spinal Brace: Thoracolumbosacral orthotic,Applied in sitting position Restrictions Weight Bearing Restrictions: No Pain:  Pt with c/o pain 4/10 in back.  Premedicated.   Therapy/Group: Individual Therapy  Rosalio Loud 05/12/2020, 9:46 AM

## 2020-05-13 NOTE — Plan of Care (Signed)
  Problem: Consults Goal: RH SPINAL CORD INJURY PATIENT EDUCATION Description:  See Patient Education module for education specifics.  Outcome: Progressing   Problem: RH SKIN INTEGRITY Goal: RH STG SKIN FREE OF INFECTION/BREAKDOWN Description: Assess skin q shift with min assist Outcome: Progressing Goal: RH STG ABLE TO PERFORM INCISION/WOUND CARE W/ASSISTANCE Description: STG Able To Perform Incision/Wound Care With Mod I Assistance. Outcome: Progressing   Problem: RH SAFETY Goal: RH STG ADHERE TO SAFETY PRECAUTIONS W/ASSISTANCE/DEVICE Description: STG Adhere to Safety Precautions With cues /reminder assistance/Device. Outcome: Progressing Goal: RH STG DECREASED RISK OF FALL WITH ASSISTANCE Description: STG Decreased Risk of Fall With cues/reminders Assistance. Outcome: Progressing   Problem: RH PAIN MANAGEMENT Goal: RH STG PAIN MANAGED AT OR BELOW PT'S PAIN GOAL Description: <2 Outcome: Progressing   Problem: RH KNOWLEDGE DEFICIT SCI Goal: RH STG INCREASE KNOWLEDGE OF SELF CARE AFTER SCI Description: Patient will be able to complete self care and direct others for care assistance needs independently at discharge using handouts and educational materials. Outcome: Progressing

## 2020-05-13 NOTE — Progress Notes (Signed)
PROGRESS NOTE   Subjective/Complaints: Timothy Ware has no complaints this morning Asks for how long he will need to wear this spinal brace Working well with OT Pleasant as always  ROS: Denies CP, SOB, N/V/D  Objective:   No results found. No results for input(s): WBC, HGB, HCT, PLT in the last 72 hours. No results for input(s): NA, K, CL, CO2, GLUCOSE, BUN, CREATININE, CALCIUM in the last 72 hours.  Intake/Output Summary (Last 24 hours) at 05/13/2020 1427 Last data filed at 05/13/2020 1315 Gross per 24 hour  Intake 1120 ml  Output 875 ml  Net 245 ml        Physical Exam: Vital Signs Blood pressure (!) 122/53, pulse 71, temperature 98.2 F (36.8 C), resp. rate 18, height 6\' 1"  (1.854 m), weight 74.2 kg, SpO2 100 %.  Gen: no distress, normal appearing HEENT: oral mucosa pink and moist, NCAT Cardio: Reg rate Chest: normal effort, normal rate of breathing Abd: soft, non-distended Ext: no edema Psych: Normal mood.  Normal behavior. Musc: No edema in extremities.  No tenderness in extremities. Neuro: Alert Motor: B/l UE: 5/5 proximal to distal LLE: 4+/5 proximal to distal, stable RLE: HF, KE 4-/5, ADF 4+/5, stable Sensation diminished to light touch RLE  Assessment/Plan: 1. Functional deficits which require 3+ hours per day of interdisciplinary therapy in a comprehensive inpatient rehab setting.  Physiatrist is providing close team supervision and 24 hour management of active medical problems listed below.  Physiatrist and rehab team continue to assess barriers to discharge/monitor patient progress toward functional and medical goals  Care Tool:  Bathing  Bathing activity did not occur: Refused           Bathing assist       Upper Body Dressing/Undressing Upper body dressing Upper body dressing/undressing activity did not occur (including orthotics): Refused What is the patient wearing?: Pull over  shirt,Orthosis Orthosis activity level: Performed by helper  Upper body assist Assist Level: Minimal Assistance - Patient > 75% (Supervision pullover shirt, Min to include orthosis)    Lower Body Dressing/Undressing Lower body dressing    Lower body dressing activity did not occur: Refused What is the patient wearing?: Pants     Lower body assist Assist for lower body dressing: Minimal Assistance - Patient > 75%     Toileting Toileting Toileting Activity did not occur and hygiene only): N/A (no void or bm)  Toileting assist Assist for toileting: Minimal Assistance - Patient > 75%     Transfers Chair/bed transfer  Transfers assist  Chair/bed transfer activity did not occur: Refused  Chair/bed transfer assist level: Contact Guard/Touching assist     Locomotion Ambulation   Ambulation assist   Ambulation activity did not occur: Safety/medical concerns (pain and unable to stand)          Walk 10 feet activity   Assist  Walk 10 feet activity did not occur: Safety/medical concerns        Walk 50 feet activity   Assist Walk 50 feet with 2 turns activity did not occur: Safety/medical concerns         Walk 150 feet activity   Assist Walk 150  feet activity did not occur: Safety/medical concerns         Walk 10 feet on uneven surface  activity   Assist Walk 10 feet on uneven surfaces activity did not occur: Safety/medical concerns         Wheelchair     Assist Will patient use wheelchair at discharge?: Yes Type of Wheelchair: Manual    Wheelchair assist level: Supervision/Verbal cueing Max wheelchair distance: 150    Wheelchair 50 feet with 2 turns activity    Assist        Assist Level: Supervision/Verbal cueing   Wheelchair 150 feet activity     Assist      Assist Level: Supervision/Verbal cueing   Medical Problem List and Plan: 1.Debilitysecondary 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 Status post right transpedicular decompression microdiscectomy T10-11 with pedicle screw fixation T10-12 with posterior lateral arthrodesis 05/02/2020. Back brace when out of bed  Continue CIR  PRAFO nightly 2. Antithrombotics: -DVT/anticoagulation: Continue Xarelto  Left femoral and popliteal DVT -antiplatelet therapy: N/A 3. Pain Management:Continue Baclofen 10 mg 3 times daily, hydrocodone as needed. Robaxin as needed  Gabapentin 300 nightly started on 2/17  Improving with meds on 2/18  Monitor with increased mobility 4. Mood:Provide emotional support -antipsychotic agents: N/A 5. Neuropsych: This patientiscapable of making decisions on hisown behalf. 6. Skin/Wound Care: Routine skin care  7. Fluids/Electrolytes/Nutrition:Routine in and outs 8. Hypertension. .   Hypotensive 2/19: has been labile, continue Tenormin 25 mg daily  Monitor with increased mobility 9. Neurogenic bowel andbladder.   PVRs relatively unremarkable  Adjust bowel meds as necessary - improving  10. Hyperlipidemia. Lipitor 11. CKD stage III. Creatinine baseline 1.27.   Creatinine 1.19 on 2/14, labs ordered for Monday 12. Nausea:?  Resolved 13.  Sleep disturbance  Melatonin started on 2/16  Improving 14.  Acute blood loss anemia  Hemoglobin 9.6 on 2/14, labs ordered for Monday   LOS: 7 days A FACE TO FACE EVALUATION WAS PERFORMED  Drema Pry [name] 05/13/2020, 2:27 PM

## 2020-05-13 NOTE — Progress Notes (Signed)
Physical Therapy Session Note  Patient Details  Name: Timothy Ware MRN: [id number] Date of Birth: 07-Jul-1934  Today's Date: 05/13/2020 PT Individual Time: 4010-2725 PT Individual Time Calculation (min): 47 min   Short Term Goals: Week 1:  PT Short Term Goal 1 (Week 1): Pt will be able to perform min assist squat pivot transfers bed <> w/c PT Short Term Goal 2 (Week 1): Pt will be able to perform sit <> stand with max assist PT Short Term Goal 3 (Week 1): Pt will be able to tolerate OOB in chair x 2 hours a day  Skilled Therapeutic Interventions/Progress Updates:    Pt received supine in bed wearing TLSO brace stating he just got up and went to the bathroom with nursing and now his back is "hurting like the dickens" rating it as 4/10 stating the pain is starting to decrease since he has been resting - stated it was so bad by the time he made it back to the bed that he had to lay down and couldn't tolerate sitting long enough to take his brace off. Pt declines medication stating that resting is decreasing the pain. Pt agreeable to therapy session requesting to take it one step at a time depending on his pain - though specifically declines standing stating he doesn't want to increase the pain again. Supine>sitting R EOB, HOB slightly elevated and heavily relying on bedrails, with supervision and increased time. Donned shoes max assist. L squat pivot/lateral scoot EOB>w/c with CGA for safety. B UE w/c propulsion ~253ft to main therapy gym with supervision. Pt able to set-up w/c for transfer, managing parts, with min cuing. R squat pivot w/c>EOM with CGA for safety/steadying - cuing to ensure R LE positioned properly prior to initiating transfer.  Sitting EOM performed the following R LE exercises:  - long arc quad through decreased ROM, lacks ability to perform ~20degrees of terminal knee extension - external target provided to encourage full ROM 2x10 reps - active assisted hip flexion 2x10reps - able  to palpate activation of hip flexors though not sufficient strength to move LE - ankle DF 3x3 reps and then has neural fatigue and is unable to move through ROM with further repetitions L squat pivot back to w/c CGA as above. B UE w/c propulsion ~267ft back to room, supervision. Discussed option of revisiting the idea of custom wheelchair consultation - pt states his wife is unable to lift his current standard wheelchair to get it in/out of the car without the assistance of their son - pt states the loaner wheelchair shown to him last time had too small of caster wheels and too many parts that needed to be removed in order to fold up the chair and that he was concerned about his wife's ability to correctly take it apart and put it back together. At this time pt's preference is to keep his current standard wheelchair. R squat pivot w/c>EOB, pt setting up wheelchair and managing parts with only 1 cue, and performing transfer with CGA. Doffed TLSO without assist. Sit>supine relying heavily on bedrail with CGA - pt unable to perform transfer correctly via reverse logroll without assist for B LE management though reports he has been performing it in this manner since the 1st spine surgery. Pt left supine in bed with needs in reach and bed alarm on.  Therapy Documentation Precautions:  Precautions Precautions: Fall,Back Precaution Comments: Back precautions, TLSO EOB Required Braces or Orthoses: Spinal Brace Spinal Brace: Thoracolumbosacral orthotic,Applied in sitting position Restrictions  Weight Bearing Restrictions: No  Pain:   Details above - no increased pain during session - modified interventions for pain management - pt declines medication.   Therapy/Group: Individual Therapy  Ginny Forth , PT, DPT, CSRS  05/13/2020, 3:20 PM

## 2020-05-13 NOTE — Progress Notes (Signed)
Occupational Therapy Weekly Progress Note  Patient Details  Name: Timothy Ware MRN: [id number] Date of Birth: 01-14-1935  Beginning of progress report period: May 07, 2020 End of progress report period: May 13, 2020  Today's Date: 05/13/2020 OT Individual Time: 1005-1100 OT Individual Time Calculation (min): 55 min    Patient has met 3 of 3 short term goals.  Pt is making steady progress towards goals, however is very limited by pain.  Pt is able to complete lateral scoot transfers min assist to CGA.  Pt has been able to complete sit > stand with mod assist with RW, however is unable and unwilling to remove alternating UE support to progress to self-care tasks at sit > stand level.  Pt is able to utilize reacher for LB dressing at bed level with lateral leans to pull pants over hips.  Pt resistive to focus on self-care tasks, stating that he "can do that at home".   Patient continues to demonstrate the following deficits: muscle weakness, decreased cardiorespiratoy endurance and decreased sitting balance, decreased standing balance, decreased postural control, decreased balance strategies and difficulty maintaining precautions and therefore will continue to benefit from skilled OT intervention to enhance overall performance with BADL and Reduce care partner burden.  Patient progressing toward long term goals..  Continue plan of care.  OT Short Term Goals Week 1:  OT Short Term Goal 1 (Week 1): Pt will use LRAD to don LB clothing with mod A OT Short Term Goal 1 - Progress (Week 1): Met OT Short Term Goal 2 (Week 1): Pt will don shirt with min A OT Short Term Goal 2 - Progress (Week 1): Met OT Short Term Goal 3 (Week 1): Pt will complete BSC transfer with mod A OT Short Term Goal 3 - Progress (Week 1): Met Week 2:  OT Short Term Goal 1 (Week 2): Pt will complete LB dressing with min assist with use of AE as needed OT Short Term Goal 2 (Week 2): Pt will complete toileting with min  assist OT Short Term Goal 3 (Week 2): Pt will complete sit > stand with min assist to progress to clothing management at sit> stand level  Skilled Therapeutic Interventions/Progress Updates:    Treatment session with focus on self-care retraining, functional transfers, and BLE strengthening.  Pt received semi-reclined in bed agreeable to therapy session.  Pt initially resistant to completing self-care tasks as he reports that he "can do that at home".  Therapist educated pt on POC and purpose of OT as well as carryover to generalized strengthening and increased independence with self-care tasks.  Therapist donned TLSO when pt seated EOB.  Pt completed LB dressing seated EOB with lateral leans and therapist encouragement and demonstration to utilize reacher to maintain back precautions while increasing independence.  Pt able to doff pants with supervision/cues and then required min assist to don pants with therapist pushing up into partial stand for therapist to pull pants over hips.  Pt transferred to w/c CGA with lateral scoots.  Pt propelled w/c to Dayroom Mod I.  Engaged in BLE strengthening at [facility] with 3 sets of 1-2 mins, therapist providing tactile cues at R knee to facilitate improved positioning.  Pt returned to room and transferred back to bed CGA lateral scoot.  Pt returned to supine with assist to advance RLE in to bed.  Therapy Documentation Precautions:  Precautions Precautions: Fall,Back Precaution Comments: Back precautions, TLSO EOB Required Braces or Orthoses: Spinal Brace Spinal Brace: Thoracolumbosacral orthotic,Applied in  sitting position Restrictions Weight Bearing Restrictions: No General:   Vital Signs: Therapy Vitals Temp: 98.2 F (36.8 C) Temp Source: Oral Pulse Rate: 71 Resp: 15 BP: 125/62 Patient Position (if appropriate): Lying Oxygen Therapy SpO2: 99 % O2 Device: Room Air Pain: Pt with c/o pain 4/10 in back with mobility.  Therapy/Group: Individual  Therapy  Simonne Come 05/13/2020, 12:24 PM

## 2020-05-14 MED ORDER — ATENOLOL 25 MG PO TABS
12.5000 mg | ORAL_TABLET | Freq: Every day | ORAL | Status: DC
  Administered 2020-05-15 – 2020-05-24 (×10): 12.5 mg via ORAL
  Filled 2020-05-14 (×10): qty 1

## 2020-05-14 NOTE — Plan of Care (Signed)
  Problem: Consults Goal: RH SPINAL CORD INJURY PATIENT EDUCATION Description:  See Patient Education module for education specifics.  Outcome: Progressing   Problem: RH SKIN INTEGRITY Goal: RH STG SKIN FREE OF INFECTION/BREAKDOWN Description: Assess skin q shift with min assist Outcome: Progressing Goal: RH STG ABLE TO PERFORM INCISION/WOUND CARE W/ASSISTANCE Description: STG Able To Perform Incision/Wound Care With Mod I Assistance. Outcome: Progressing   Problem: RH SAFETY Goal: RH STG ADHERE TO SAFETY PRECAUTIONS W/ASSISTANCE/DEVICE Description: STG Adhere to Safety Precautions With cues /reminder assistance/Device. Outcome: Progressing Goal: RH STG DECREASED RISK OF FALL WITH ASSISTANCE Description: STG Decreased Risk of Fall With cues/reminders Assistance. Outcome: Progressing   Problem: RH PAIN MANAGEMENT Goal: RH STG PAIN MANAGED AT OR BELOW PT'S PAIN GOAL Description: <2 Outcome: Progressing   Problem: RH KNOWLEDGE DEFICIT SCI Goal: RH STG INCREASE KNOWLEDGE OF SELF CARE AFTER SCI Description: Patient will be able to complete self care and direct others for care assistance needs independently at discharge using handouts and educational materials. Outcome: Progressing   

## 2020-05-14 NOTE — Progress Notes (Signed)
PROGRESS NOTE   Subjective/Complaints: No complaints  ROS: Denies CP, SOB, N/V/D  Objective:   No results found. No results for input(s): WBC, HGB, HCT, PLT in the last 72 hours. No results for input(s): NA, K, CL, CO2, GLUCOSE, BUN, CREATININE, CALCIUM in the last 72 hours.  Intake/Output Summary (Last 24 hours) at 05/14/2020 1024 Last data filed at 05/14/2020 0735 Gross per 24 hour  Intake 1140 ml  Output 1075 ml  Net 65 ml        Physical Exam: Vital Signs Blood pressure (!) 114/55, pulse 65, temperature 98 F (36.7 C), temperature source Oral, resp. rate 16, height 6\' 1"  (1.854 m), weight 74.2 kg, SpO2 100 %.  Gen: no distress, normal appearing HEENT: oral mucosa pink and moist, NCAT Cardio: Reg rate Chest: normal effort, normal rate of breathing Abd: soft, non-distended Ext: no edema Psych: pleasant, normal affect Skin: intact Neuro: Musc: No edema in extremities.  No tenderness in extremities. Neuro: Alert Motor: B/l UE: 5/5 proximal to distal LLE: 4+/5 proximal to distal, stable RLE: HF, KE 4-/5, ADF 4+/5, stable Sensation diminished to light touch RLE  Assessment/Plan: 1. Functional deficits which require 3+ hours per day of interdisciplinary therapy in a comprehensive inpatient rehab setting.  Physiatrist is providing close team supervision and 24 hour management of active medical problems listed below.  Physiatrist and rehab team continue to assess barriers to discharge/monitor patient progress toward functional and medical goals  Care Tool:  Bathing  Bathing activity did not occur: Refused           Bathing assist       Upper Body Dressing/Undressing Upper body dressing Upper body dressing/undressing activity did not occur (including orthotics): Refused What is the patient wearing?: Pull over shirt,Orthosis Orthosis activity level: Performed by helper  Upper body assist Assist Level:  Minimal Assistance - Patient > 75% (Supervision pullover shirt, Min to include orthosis)    Lower Body Dressing/Undressing Lower body dressing    Lower body dressing activity did not occur: Refused What is the patient wearing?: Pants     Lower body assist Assist for lower body dressing: Minimal Assistance - Patient > 75%     Toileting Toileting Toileting Activity did not occur and hygiene only): N/A (no void or bm)  Toileting assist Assist for toileting: Minimal Assistance - Patient > 75%     Transfers Chair/bed transfer  Transfers assist  Chair/bed transfer activity did not occur: Refused  Chair/bed transfer assist level: Contact Guard/Touching assist (squat pivot)     Locomotion Ambulation   Ambulation assist   Ambulation activity did not occur: Safety/medical concerns (pain and unable to stand)          Walk 10 feet activity   Assist  Walk 10 feet activity did not occur: Safety/medical concerns        Walk 50 feet activity   Assist Walk 50 feet with 2 turns activity did not occur: Safety/medical concerns         Walk 150 feet activity   Assist Walk 150 feet activity did not occur: Safety/medical concerns         Walk 10 feet  on uneven surface  activity   Assist Walk 10 feet on uneven surfaces activity did not occur: Safety/medical concerns         Wheelchair     Assist Will patient use wheelchair at discharge?: Yes Type of Wheelchair: Manual    Wheelchair assist level: Supervision/Verbal cueing Max wheelchair distance: 269ft    Wheelchair 50 feet with 2 turns activity    Assist        Assist Level: Supervision/Verbal cueing   Wheelchair 150 feet activity     Assist      Assist Level: Supervision/Verbal cueing   Medical Problem List and Plan: 1.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 Status post right transpedicular  decompression microdiscectomy T10-11 with pedicle screw fixation T10-12 with posterior lateral arthrodesis 05/02/2020. Back brace when out of bed  Continue CIR  PRAFO nightly 2. Antithrombotics: -DVT/anticoagulation: Continue Xarelto  Left femoral and popliteal DVT -antiplatelet therapy: N/A 3. Pain Management:Continue Baclofen 10 mg 3 times daily, hydrocodone as needed. Robaxin as needed  Gabapentin 300 nightly started on 2/17  Improving with meds on 2/18  Monitor with increased mobility 4. Mood:Provide emotional support -antipsychotic agents: N/A 5. Neuropsych: This patientiscapable of making decisions on hisown behalf. 6. Skin/Wound Care: Routine skin care  7. Fluids/Electrolytes/Nutrition:Routine in and outs 8. Hypertension. .   Hypotensive 2/20: flowsheet  Reviewed and has been soft last several reads.  Tenormin decreased to 12.5mg    Monitor with increased mobility 9. Neurogenic bowel andbladder.   PVRs relatively unremarkable  Adjust bowel meds as necessary - improving  10. Hyperlipidemia. Lipitor 11. CKD stage III. Creatinine baseline 1.27.   Creatinine 1.19 on 2/14, labs ordered for Monday 12. Nausea:?  Resolved 13.  Sleep disturbance  Melatonin started on 2/16  Improving 14.  Acute blood loss anemia  Hemoglobin 9.6 on 2/14, labs ordered for Monday   LOS: 8 days A FACE TO FACE EVALUATION WAS PERFORMED  Timothy Ware 05/14/2020, 10:24 AM

## 2020-05-15 LAB — CBC WITH DIFFERENTIAL/PLATELET
Abs Immature Granulocytes: 0.04 10*3/uL (ref 0.00–0.07)
Basophils Absolute: 0 10*3/uL (ref 0.0–0.1)
Basophils Relative: 0 %
Eosinophils Absolute: 0 10*3/uL (ref 0.0–0.5)
Eosinophils Relative: 0 %
HCT: 29.7 % — ABNORMAL LOW (ref 39.0–52.0)
Hemoglobin: 9.2 g/dL — ABNORMAL LOW (ref 13.0–17.0)
Immature Granulocytes: 0 %
Lymphocytes Relative: 25 %
Lymphs Abs: 2.4 10*3/uL (ref 0.7–4.0)
MCH: 29.4 pg (ref 26.0–34.0)
MCHC: 31 g/dL (ref 30.0–36.0)
MCV: 94.9 fL (ref 80.0–100.0)
Monocytes Absolute: 0.8 10*3/uL (ref 0.1–1.0)
Monocytes Relative: 9 %
Neutro Abs: 6.2 10*3/uL (ref 1.7–7.7)
Neutrophils Relative %: 66 %
Platelets: 337 10*3/uL (ref 150–400)
RBC: 3.13 MIL/uL — ABNORMAL LOW (ref 4.22–5.81)
RDW: 13.9 % (ref 11.5–15.5)
WBC: 9.5 10*3/uL (ref 4.0–10.5)
nRBC: 0 % (ref 0.0–0.2)

## 2020-05-15 LAB — BASIC METABOLIC PANEL
Anion gap: 7 (ref 5–15)
BUN: 17 mg/dL (ref 8–23)
CO2: 26 mmol/L (ref 22–32)
Calcium: 8.4 mg/dL — ABNORMAL LOW (ref 8.9–10.3)
Chloride: 104 mmol/L (ref 98–111)
Creatinine, Ser: 1.16 mg/dL (ref 0.61–1.24)
GFR, Estimated: >60 mL/min (ref >60)
Glucose, Bld: 100 mg/dL — ABNORMAL HIGH (ref 70–99)
Potassium: 3.6 mmol/L (ref 3.5–5.1)
Sodium: 137 mmol/L (ref 135–145)

## 2020-05-15 LAB — OCCULT BLOOD X 1 CARD TO LAB, STOOL: Fecal Occult Bld: NEGATIVE

## 2020-05-15 NOTE — Progress Notes (Signed)
Occupational Therapy Session Note  Patient Details  Name: Timothy Ware MRN: [id number] Date of Birth: 01-Feb-1935  Today's Date: 05/15/2020 OT Individual Time: 4696-2952 OT Individual Time Calculation (min): 30 min    Short Term Goals: Week 2:  OT Short Term Goal 1 (Week 2): Pt will complete LB dressing with min assist with use of AE as needed OT Short Term Goal 2 (Week 2): Pt will complete toileting with min assist OT Short Term Goal 3 (Week 2): Pt will complete sit > stand with min assist to progress to clothing management at sit> stand level  Skilled Therapeutic Interventions/Progress Updates:    Pt received in bed stating that he was cleaned up and had clean pants put on in the middle of the night when his urinal spilled.  He did not need to don new clothing.  Taking his time, he sat to EOB with S using back precautions and assisted pt with donning his TLSO.   Completed squat pivot to wc to his L side with close S.  Sat at sink to complete oral care and shaving.  Pt then needed to toilet and requested use of Stedy to get to toilet. Tried to encourage pt to just do a squat pivot as he did so well from bed to wc but pt very hesitant and really just wanted the stedy due to feeling urgency to go to the bathroom.  Sit >< stand in stedy with S. Pt sat on toilet and was able to have a bowel movement.  Pt had not completed toileting yet, but PT arrived to assist.  Therapy Documentation Precautions:  Precautions Precautions: Fall,Back Precaution Comments: Back precautions, TLSO EOB Required Braces or Orthoses: Spinal Brace Spinal Brace: Thoracolumbosacral orthotic,Applied in sitting position Restrictions Weight Bearing Restrictions: No  Pain: 1-2/10 low back   ADL: ADL Eating: Independent Grooming: Unable to assess Upper Body Bathing: Unable to assess Lower Body Bathing: Unable to assess Upper Body Dressing: Unable to assess Lower Body Dressing: Unable to assess Toileting: Unable to  assess Toilet Transfer: Unable to assess Tub/Shower Transfer: Unable to assess Tub/Shower Transfer Method: Unable to assess  Therapy/Group: Individual Therapy  SAGUIER,JULIA 05/15/2020, 8:50 AM

## 2020-05-15 NOTE — Progress Notes (Signed)
Physical Therapy Session Note  Patient Details  Name: Timothy Ware MRN: [id number] Date of Birth: 11/29/1934  Today's Date: 05/15/2020 PT Individual Time: [phone] and [id number] PT Individual Time Calculation (min): 54 min and 44 min  Short Term Goals: Week 1:  PT Short Term Goal 1 (Week 1): Pt will be able to perform min assist squat pivot transfers bed <> w/c PT Short Term Goal 1 - Progress (Week 1): Met PT Short Term Goal 2 (Week 1): Pt will be able to perform sit <> stand with max assist PT Short Term Goal 2 - Progress (Week 1): Met PT Short Term Goal 3 (Week 1): Pt will be able to tolerate OOB in chair x 2 hours a day PT Short Term Goal 3 - Progress (Week 1): Progressing toward goal Week 2:  PT Short Term Goal 1 (Week 2): pt will perform bed mobility with CGA overall PT Short Term Goal 2 (Week 2): pt will be able to tolerate OOB in chair x 2 hours a day PT Short Term Goal 3 (Week 2): pt will verbalize and demonstrate understanding of back precautions  Skilled Therapeutic Interventions/Progress Updates:  Treatment Session 1: [id number] 54 min Received pt sitting on commode; handoff from OT, pt agreeable to therapy, and reported feeling "alright" when asked about pain level. TLSO donned during session. Session with emphasis on toileting, functional mobility/transfers, generalized strengthening, dynamic standing balance/coordination, and improved activity tolerance. Pt with medium BM and required total A for peri-care in standing and max A to pull pants over hips. Dependent transfer from toilet to [facility] via stedy as pt already using stedy upon therapist's entry. Of note, pt able to stand in stedy with supervision and encouraged pt to try lateral scoots/squat<>pivots onto toilet in future sessions as pt is currently able to transfer with CGA. Pt performed WC mobility >540f using BUE and supervision and performed seated LE strengthening on Kinetron at 90cm/sec for 1 minute x 4 trials with BUE  support with emphasis on glute/quad strengthening. Pt demonstrated improvements in R hip extension and was able to lift RLE off of footrest twice during session using momentum. Pt required 3 attempts and mod A to stand with RW with therapist blocking R knee x 2 trials. Pt prefers to put BUE on RW when standing to feel more "stable". Pt able to stand ~45 seconds x 1 and ~30 seconds x 1 prior to sitting. Pt did report being able to feel RLE shaking from weakness on trial 2. Pt continues to have difficulty weight shifting onto RLE but to inability to feel it. Pt able to recall 2/3 back precautions but required cues for "no lifting". [facility]. Concluded session with pt sitting in WC, needs within reach, and chair pad alarm on.   Treatment Session 2: [id number] 44 min Received pt supine in bed stating "it was a trip getting up here" [facility] agreeable to therapy, and reported low pain level in back but did not state level and declined pain interventions. Session with emphasis on functional mobility/transfers, generalized strengthening, standing tolerance, toileting, and improved activity tolerance. Pt transferred supine<>sitting EOB using logroll technique with supervision and donned TLSO sitting EOB with mod A and shoes with max A for time management purposes. Pt transferred sit<>stand with RW and min A with therapist blocking R knee pulling up on RW with BUE despite cues for  hand placement and technique. Pt able to remain standing for 1 minute x 1 trial and 1 minute and 27 seconds x 1 trial with BUE support on RW. Pt able to extend R knee today and place ~10% more weight on RLE compared to previous sessions. Pt then reported urge to have BM and declined transferring via squat<>pivot and insisted again on using stedy due to urgency. Discussed need for pt to practice toilet transfers without stedy for  independence however pt stated "well not today" and with hesitation due to new bathroom setup. Sit<>stand in stedy with supervision and dependent transfer to toilet with bedside commode over top. Pt required total A for clothing management and able to void and with medium BM. Pt required total A for peri-care in standing and transferred back to bed via stedy. Doffed TLSO with supervision and shoes with max A and transferred sit<>supine with supervision while maintaining back precautions. Concluded session with pt supine in bed, needs within reach, and bed alarm on.   Therapy Documentation Precautions:  Precautions Precautions: Fall,Back Precaution Comments: Back precautions, TLSO EOB Required Braces or Orthoses: Spinal Brace Spinal Brace: Thoracolumbosacral orthotic,Applied in sitting position Restrictions Weight Bearing Restrictions: No   Therapy/Group: Individual Therapy Alfonse Alpers PT, DPT   05/15/2020, 7:26 AM

## 2020-05-15 NOTE — Plan of Care (Signed)
  Problem: RH Balance Goal: LTG Patient will maintain dynamic standing balance (PT) Description: LTG:  Patient will maintain dynamic standing balance with assistance during mobility activities (PT) Flowsheets (Taken 05/15/2020 0745) LTG: Pt will maintain dynamic standing balance during mobility activities with:: (upgraded due to improved activity tolerance and balance) Minimal Assistance - Patient > 75% Note: upgraded due to improved activity tolerance and balance   Problem: Sit to Stand Goal: LTG:  Patient will perform sit to stand with assistance level (PT) Description: LTG:  Patient will perform sit to stand with assistance level (PT) Flowsheets (Taken 05/15/2020 0745) LTG: PT will perform sit to stand in preparation for functional mobility with assistance level: (upgraded due to improved activity tolerance and balance) Minimal Assistance - Patient > 75% Note: upgraded due to improved activity tolerance and balance   Problem: RH Car Transfers Goal: LTG Patient will perform car transfers with assist (PT) Description: LTG: Patient will perform car transfers with assistance (PT). Flowsheets (Taken 05/15/2020 0745) LTG: Pt will perform car transfers with assist:: (upgraded due to improved activity tolerance and balance) Contact Guard/Touching assist Note: upgraded due to improved activity tolerance and balance

## 2020-05-15 NOTE — Progress Notes (Signed)
Physical Therapy Weekly Progress Note  Patient Details  Name: Timothy Ware MRN: [id number] Date of Birth: 06-16-1934  Beginning of progress report period: May 07, 2020 End of progress report period: May 15, 2020  Patient has met 2 of 3 short term goals. Pt demonstrates gradual progress towards long term goals. Pt currently requires min A for bed mobility for RLE management, CGA for lateral scoot transfers, mod A for sit<>stand transfers with RW with therapist blocking RLE, and supervision for WC mobility >165f. Pt continues to be limited by high pain levels, generalized weakness, and poor endurance to activity.   Patient continues to demonstrate the following deficits muscle weakness, decreased cardiorespiratoy endurance and decreased standing balance, decreased postural control, decreased balance strategies and difficulty maintaining precautions and therefore will continue to benefit from skilled PT intervention to increase functional independence with mobility.  Patient progressing toward long term goals..  Continue plan of care.  PT Short Term Goals Week 1:  PT Short Term Goal 1 (Week 1): Pt will be able to perform min assist squat pivot transfers bed <> w/c PT Short Term Goal 1 - Progress (Week 1): Met PT Short Term Goal 2 (Week 1): Pt will be able to perform sit <> stand with max assist PT Short Term Goal 2 - Progress (Week 1): Met PT Short Term Goal 3 (Week 1): Pt will be able to tolerate OOB in chair x 2 hours a day PT Short Term Goal 3 - Progress (Week 1): Progressing toward goal Week 2:  PT Short Term Goal 1 (Week 2): pt will perform bed mobility with CGA overall PT Short Term Goal 2 (Week 2): pt will be able to tolerate OOB in chair x 2 hours a day PT Short Term Goal 3 (Week 2): pt will verbalize and demonstrate understanding of back precautions  Skilled Therapeutic Interventions/Progress Updates:  Ambulation/gait training;Balance/vestibular training;Community  reintegration;Discharge planning;Disease management/prevention;DME/adaptive equipment instruction;Functional mobility training;Neuromuscular re-education;Pain management;Patient/family education;Psychosocial support;Skin care/wound management;Functional electrical stimulation;Splinting/orthotics;Stair training;Therapeutic Activities;Therapeutic Exercise;UE/LE Strength taining/ROM;UE/LE Coordination activities;Wheelchair propulsion/positioning   Therapy Documentation Precautions:  Precautions Precautions: Fall,Back Precaution Comments: Back precautions, TLSO EOB Required Braces or Orthoses: Spinal Brace Spinal Brace: Thoracolumbosacral orthotic,Applied in sitting position Restrictions Weight Bearing Restrictions: No  Therapy/Group: Individual Therapy AAlfonse AlpersPT, DPT   05/15/2020, 7:23 AM

## 2020-05-15 NOTE — Progress Notes (Signed)
PROGRESS NOTE   Subjective/Complaints: Up at EOB with OT trying to don TLSO. Didn't move around as much this weekend as he hoped he would. Right leg still pretty weak.  ROS: Patient denies fever, rash, sore throat, blurred vision, nausea, vomiting, diarrhea, cough, shortness of breath or chest pain,   headache, or mood change.   Objective:   No results found. Recent Labs    05/15/20 0506  WBC 9.5  HGB 9.2*  HCT 29.7*  PLT 337   Recent Labs    05/15/20 0506  NA 137  K 3.6  CL 104  CO2 26  GLUCOSE 100*  BUN 17  CREATININE 1.16  CALCIUM 8.4*    Intake/Output Summary (Last 24 hours) at 05/15/2020 1057 Last data filed at 05/15/2020 0718 Gross per 24 hour  Intake 1200 ml  Output 675 ml  Net 525 ml        Physical Exam: Vital Signs Blood pressure (!) 117/55, pulse 70, temperature 98.4 F (36.9 C), temperature source Oral, resp. rate 14, height 6\' 1"  (1.854 m), weight 74.2 kg, SpO2 97 %.  Constitutional: No distress . Vital signs reviewed. HEENT: EOMI, oral membranes moist Neck: supple Cardiovascular: RRR without murmur. No JVD    Respiratory/Chest: CTA Bilaterally without wheezes or rales. Normal effort    GI/Abdomen: BS +, non-tender, non-distended Ext: no clubbing, cyanosis, or edema Psych: pleasant and cooperative Skin: intact Neuro: Musc: No edema in extremities.  No tenderness in extremities. Neuro: Alert Motor: B/l UE: 5/5 proximal to distal LLE: 4+/5 proximal to distal, stable RLE: HF and KE 2+ (pain component too)/5, ADF 4+/5, stable Sensation remains diminished to light touch RLE, prox more than distal  Assessment/Plan: 1. Functional deficits which require 3+ hours per day of interdisciplinary therapy in a comprehensive inpatient rehab setting.  Physiatrist is providing close team supervision and 24 hour management of active medical problems listed below.  Physiatrist and rehab team continue  to assess barriers to discharge/monitor patient progress toward functional and medical goals  Care Tool:  Bathing  Bathing activity did not occur: Refused           Bathing assist       Upper Body Dressing/Undressing Upper body dressing Upper body dressing/undressing activity did not occur (including orthotics): Refused What is the patient wearing?: Pull over shirt,Orthosis Orthosis activity level: Performed by helper  Upper body assist Assist Level: Minimal Assistance - Patient > 75% (Supervision pullover shirt, Min to include orthosis)    Lower Body Dressing/Undressing Lower body dressing    Lower body dressing activity did not occur: Refused What is the patient wearing?: Pants     Lower body assist Assist for lower body dressing: Minimal Assistance - Patient > 75%     Toileting Toileting Toileting Activity did not occur (Clothing management and hygiene only): N/A (no void or bm)  Toileting assist Assist for toileting: Minimal Assistance - Patient > 75%     Transfers Chair/bed transfer  Transfers assist  Chair/bed transfer activity did not occur: Refused  Chair/bed transfer assist level: Contact Guard/Touching assist (squat pivot)     Locomotion Ambulation   Ambulation assist   Ambulation activity did  not occur: Safety/medical concerns (pain and unable to stand)          Walk 10 feet activity   Assist  Walk 10 feet activity did not occur: Safety/medical concerns        Walk 50 feet activity   Assist Walk 50 feet with 2 turns activity did not occur: Safety/medical concerns         Walk 150 feet activity   Assist Walk 150 feet activity did not occur: Safety/medical concerns         Walk 10 feet on uneven surface  activity   Assist Walk 10 feet on uneven surfaces activity did not occur: Safety/medical concerns         Wheelchair     Assist Will patient use wheelchair at discharge?: Yes Type of Wheelchair: Manual     Wheelchair assist level: Supervision/Verbal cueing Max wheelchair distance: >541ft    Wheelchair 50 feet with 2 turns activity    Assist        Assist Level: Supervision/Verbal cueing   Wheelchair 150 feet activity     Assist      Assist Level: Supervision/Verbal cueing  BP (!) 117/55 (BP Location: Left Arm)   Pulse 70   Temp 98.4 F (36.9 C) (Oral)   Resp 14   Ht 6\' 1"  (1.854 m)   Wt 74.2 kg   SpO2 97%   BMI 21.58 kg/m    Medical Problem List and Plan: 1.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 Status post right transpedicular decompression microdiscectomy T10-11 with pedicle screw fixation T10-12 with posterior lateral arthrodesis 05/02/2020. Back brace when out of bed  Continue CIR  PRAFO nightly 2. Antithrombotics: -DVT/anticoagulation: Continue Xarelto  Left femoral and popliteal DVT -antiplatelet therapy: N/A 3. Pain Management:Continue Baclofen 10 mg 3 times daily, hydrocodone as needed. Robaxin as needed  Gabapentin 300 nightly started on 2/17  Improved  2/21   Monitor with increased mobility 4. Mood:Provide emotional support -antipsychotic agents: N/A 5. Neuropsych: This patientiscapable of making decisions on hisown behalf. 6. Skin/Wound Care: Routine skin care  7. Fluids/Electrolytes/Nutrition:Routine in and outs 8. Hypertension. .    Tenormin decreased to 12.5mg --bp more robust 2/21   Monitor with increased mobility 9. Neurogenic bowel andbladder.   PVRs relatively unremarkable   improving  10. Hyperlipidemia. Lipitor 11. CKD stage III. Creatinine baseline 1.27.   Creatinine 1.19 on 2/14--->1.16 2/21 12. Nausea:?  Resolved 13.  Sleep disturbance  Melatonin started on 2/16  Improved sleep abits 14.  Acute blood loss anemia  Hemoglobin 9.6 on 2/14--> 9.2 2/21   -slow trend downward- no gross loss but on xarelto   -check stool for  OB   -recheck CBC 2/23   LOS: 9 days A FACE TO FACE EVALUATION WAS PERFORMED  3/23 05/15/2020, 10:56 AM

## 2020-05-16 NOTE — Progress Notes (Signed)
Physical Therapy Session Note  Patient Details  Name: Timothy Ware MRN: [id number] Date of Birth: [birth year]  Today's Date: 05/16/2020 PT Individual Time: 2458-0998 PT Individual Time Calculation (min): 30 min   Short Term Goals: Week 2:  PT Short Term Goal 1 (Week 2): pt will perform bed mobility with CGA overall PT Short Term Goal 2 (Week 2): pt will be able to tolerate OOB in chair x 2 hours a day PT Short Term Goal 3 (Week 2): pt will verbalize and demonstrate understanding of back precautions  Skilled Therapeutic Interventions/Progress Updates:    Pt received sitting in w/c as hand-off from Maralyn Sago, OT. Pt agreeable to PT session. Wearing TLSO during session - increased height of anterior chest plate on brace to level "2" as noticed during standing the top of the brace being down near pt's xiphoid process with decreased spine support. Pt set-up wheelchair for transfer to mat with only 1x min cuing for leg rest management. R squat pivot w/c>EOM, with close supervision/CGA for safety - demos good R LE management during transfer using UE support to reposition/place LE, good hip clearance, and good sequencing of task.   Sit<>stands to/from EOM x5 using RW - pt placing R hand on RW and pushing up with L hand on mat - min assist for lifting/lowering/balance - therapist guarding R knee but no true buckling noted during session: - pt continues to report he is unable to feel anything in  RLE though does state feeling a "tightness" above R knee and this is correlated with when he is activating his quads therefore used that as biofeedback  - provided mirror feedback as well to demonstrate to pt that he is able to sustain R knee position with little to no assistance from therapist (guarding for safety still though) - progressed to static standing with only 1 UE support on RW progressed to no UE support for ~10seconds - tolerated standing for ~37minute each bout   L squat pivot back to w/c as above.  Transported back to room and pt requesting to get back in the bed. R squat pivot w/c>EOB with CGA for safety due to more compliant surface of bed making it more difficult for pt to achieve hip clearance. Doffed TLSO. Sit>supine with mod assist for B LE management to complete reverse logroll technique. Pt left supine in bed with needs in reach and bed alarm on.  Therapy Documentation Precautions:  Precautions Precautions: Fall,Back Precaution Comments: Back precautions, TLSO EOB Required Braces or Orthoses: Spinal Brace Spinal Brace: Thoracolumbosacral orthotic,Applied in sitting position Restrictions Weight Bearing Restrictions: No  Pain:   Reports some pain during session, unrated, though does not limit participation - provided frequent seated rest breaks for pain management.   Therapy/Group: Individual Therapy  Ginny Forth , PT, DPT, CSRS  05/16/2020, 12:56 PM

## 2020-05-16 NOTE — Progress Notes (Signed)
PROGRESS NOTE   Subjective/Complaints: Patient seen laying in bed this morning.  He states he slept well overnight.  He has questions regarding neurological recovery.  ROS: Denies CP, SOB, N/V/D  Objective:   No results found. Recent Labs    05/15/20 0506  WBC 9.5  HGB 9.2*  HCT 29.7*  PLT 337   Recent Labs    05/15/20 0506  NA 137  K 3.6  CL 104  CO2 26  GLUCOSE 100*  BUN 17  CREATININE 1.16  CALCIUM 8.4*    Intake/Output Summary (Last 24 hours) at 05/16/2020 0917 Last data filed at 05/16/2020 0720 Gross per 24 hour  Intake 420 ml  Output 450 ml  Net -30 ml        Physical Exam: Vital Signs Blood pressure (!) 142/64, pulse 73, temperature 98.4 F (36.9 C), temperature source Oral, resp. rate 18, height 6\' 1"  (1.854 m), weight 74.2 kg, SpO2 97 %.  Constitutional: No distress . Vital signs reviewed. HENT: Normocephalic.  Atraumatic. Eyes: EOMI. No discharge. Cardiovascular: No JVD.  RRR. Respiratory: Normal effort.  No stridor.  Bilateral clear to auscultation. GI: Non-distended.  BS +. Skin: Warm and dry.  Intact. Psych: Normal mood.  Normal behavior. Musc: No edema in extremities.  No tenderness in extremities. Neuro: Alert Motor: B/l UE: 5/5 proximal to distal LLE: 4+/5 proximal to distal, unchanged RLE: HF and KE 4-/5 (pain component too), ADF 4+/5, stable Sensation remains diminished to light touch RLE, prox more than distal  Assessment/Plan: 1. Functional deficits which require 3+ hours per day of interdisciplinary therapy in a comprehensive inpatient rehab setting.  Physiatrist is providing close team supervision and 24 hour management of active medical problems listed below.  Physiatrist and rehab team continue to assess barriers to discharge/monitor patient progress toward functional and medical goals  Care Tool:  Bathing  Bathing activity did not occur: Refused            Bathing assist       Upper Body Dressing/Undressing Upper body dressing Upper body dressing/undressing activity did not occur (including orthotics): Refused What is the patient wearing?: Pull over shirt,Orthosis Orthosis activity level: Performed by helper  Upper body assist Assist Level: Minimal Assistance - Patient > 75% (Supervision pullover shirt, Min to include orthosis)    Lower Body Dressing/Undressing Lower body dressing    Lower body dressing activity did not occur: Refused What is the patient wearing?: Pants     Lower body assist Assist for lower body dressing: Minimal Assistance - Patient > 75%     Toileting Toileting Toileting Activity did not occur (Clothing management and hygiene only): N/A (no void or bm)  Toileting assist Assist for toileting: Minimal Assistance - Patient > 75%     Transfers Chair/bed transfer  Transfers assist  Chair/bed transfer activity did not occur: Refused  Chair/bed transfer assist level: Contact Guard/Touching assist (squat pivot)     Locomotion Ambulation   Ambulation assist   Ambulation activity did not occur: Safety/medical concerns (pain and unable to stand)          Walk 10 feet activity   Assist  Walk 10 feet activity did  not occur: Safety/medical concerns        Walk 50 feet activity   Assist Walk 50 feet with 2 turns activity did not occur: Safety/medical concerns         Walk 150 feet activity   Assist Walk 150 feet activity did not occur: Safety/medical concerns         Walk 10 feet on uneven surface  activity   Assist Walk 10 feet on uneven surfaces activity did not occur: Safety/medical concerns         Wheelchair     Assist Will patient use wheelchair at discharge?: Yes Type of Wheelchair: Manual    Wheelchair assist level: Supervision/Verbal cueing Max wheelchair distance: >529ft    Wheelchair 50 feet with 2 turns activity    Assist        Assist Level:  Supervision/Verbal cueing   Wheelchair 150 feet activity     Assist      Assist Level: Supervision/Verbal cueing   Medical Problem List and Plan: 1.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 Status post right transpedicular decompression microdiscectomy T10-11 with pedicle screw fixation T10-12 with posterior lateral arthrodesis 05/02/2020. Back brace when out of bed  Continue CIR  PRAFO nightly 2. Antithrombotics: -DVT/anticoagulation: Continue Xarelto  Left femoral and popliteal DVT -antiplatelet therapy: N/A 3. Pain Management:Continue Baclofen 10 mg 3 times daily, hydrocodone as needed. Robaxin as needed  Gabapentin 300 nightly started on 2/17  Controlled with meds on 2/22  Monitor with increased mobility 4. Mood:Provide emotional support -antipsychotic agents: N/A 5. Neuropsych: This patientiscapable of making decisions on hisown behalf. 6. Skin/Wound Care: Routine skin care  7. Fluids/Electrolytes/Nutrition:Routine in and outs 8. Hypertension.     Tenormin decreased to 12.5mg   Slightly labile on 2/22  Monitor with increased mobility 9. Neurogenic bowel andbladder.   PVRs relatively unremarkable   improving  10. Hyperlipidemia. Lipitor 11. CKD stage III. Creatinine baseline 1.27.   Creatinine 1.16 on 2/21 12. Nausea:?  Resolved 13.  Sleep disturbance  Melatonin started on 2/16  Improving 14.  Acute blood loss anemia  Hemoglobin 9.2 on 2/21  Hemoccult negative   LOS: 10 days A FACE TO FACE EVALUATION WAS PERFORMED  Timothy Ware [name] 05/16/2020, 9:17 AM

## 2020-05-16 NOTE — Progress Notes (Signed)
Occupational Therapy Session Note  Patient Details  Name: Timothy Ware MRN: [id number] Date of Birth: 1935-03-24  Today's Date: 05/16/2020 OT Individual Time: 1000-1030 and 1306-1400 OT Individual Time Calculation (min): 30 min and 54 min   Short Term Goals: Week 2:  OT Short Term Goal 1 (Week 2): Pt will complete LB dressing with min assist with use of AE as needed OT Short Term Goal 2 (Week 2): Pt will complete toileting with min assist OT Short Term Goal 3 (Week 2): Pt will complete sit > stand with min assist to progress to clothing management at sit> stand level  Skilled Therapeutic Interventions/Progress Updates:    1) Treatment session with focus on functional transfers and BUE strengthening and endurance.  Pt received semi-reclined in bed agreeable to therapy session.  Pt completed bed mobility with supervision to come to sitting EOB.  Therapist placed TLSO around back, pt then able to fasten in front and complete clips when retrieved by therapist.  Pt completed lateral scoot transfer bed > w/c with CGA.  Engaged in BUE strengthening with level 2 theraband with focus on tricep strengthening as needed for transfers and sit > stand, completing 2 sets of 10 each exercise.  Pt propelled w/c 100' Mod I for BUE strengthening and endurance.  2) Treatment session with focus on sit > stand, weight shifting in standing, and BUE strengthening.  Pt received upright in w/c in room with wife present.  Pt propelled w/c on/off elevator with min cues for backing in to elevator.  Engaged in sit > stand outside parallel bars at mirror with focus on upright standing posture and increased tolerance of weight through RLE.  Pt completed sit> stand with min assist to CGA with cues for hand placement, to decrease pulling up on bars.  Pt tolerated standing 1-2 mins at a time with increased tolerance of weight through RLE.  Therapist encouraged pt to engage in weight shifting R and L with increased tolerance of weight  through RLE with no buckling.  Pt continues to report "shakiness" in RLE and not trusting it.  Engaged in BUE strengthening with 2" dowel rod with chest presses and lateral rows.  Progressing to 3# hand weight with focus on tricep dips and diagonals.  Pt continues to be limited in overhead exercises due to previous L rotator cuff injury.  Engaged in wall glides and finger walks up wall with focus on increased tolerance of overhead mobility.  Pt passed off to PT for next treatment session.    Therapy Documentation Precautions:  Precautions Precautions: Fall,Back Precaution Comments: Back precautions, TLSO EOB Required Braces or Orthoses: Spinal Brace Spinal Brace: Thoracolumbosacral orthotic,Applied in sitting position Restrictions Weight Bearing Restrictions: No General:   Vital Signs: Therapy Vitals Temp: 98.8 F (37.1 C) Temp Source: Oral Pulse Rate: 72 Resp: 16 BP: 109/64 Patient Position (if appropriate): Lying Oxygen Therapy SpO2: 100 % O2 Device: Room Air Pain:  Pt with c/o pain in lower back, not rated.  Premedicated and RN aware.   Therapy/Group: Individual Therapy  Rosalio Loud 05/16/2020, 3:47 PM

## 2020-05-16 NOTE — Progress Notes (Signed)
Physical Therapy Session Note  Patient Details  Name: Timothy Ware MRN: [id number] Date of Birth: Jun 04, 1934  Today's Date: 05/16/2020 PT Individual Time: 1100-1153 PT Individual Time Calculation (min): 53 min   Short Term Goals: Week 1:  PT Short Term Goal 1 (Week 1): Pt will be able to perform min assist squat pivot transfers bed <> w/c PT Short Term Goal 1 - Progress (Week 1): Met PT Short Term Goal 2 (Week 1): Pt will be able to perform sit <> stand with max assist PT Short Term Goal 2 - Progress (Week 1): Met PT Short Term Goal 3 (Week 1): Pt will be able to tolerate OOB in chair x 2 hours a day PT Short Term Goal 3 - Progress (Week 1): Progressing toward goal Week 2:  PT Short Term Goal 1 (Week 2): pt will perform bed mobility with CGA overall PT Short Term Goal 2 (Week 2): pt will be able to tolerate OOB in chair x 2 hours a day PT Short Term Goal 3 (Week 2): pt will verbalize and demonstrate understanding of back precautions  Skilled Therapeutic Interventions/Progress Updates:   Received pt sitting in WC, pt agreeable to therapy, and did not state pain level during session but did c/o increased low back pain when standing. Session with emphasis on functional mobility/transfers, generalized strengthening, dynamic standing balance/coordination, and improved activity tolerance. TLSO donned during session. Pt performed WC mobility >561f from room to [facility], practiced pushing buttons and timing getting on/off elevator, and propelling to 4W dayroom using BUE and supervision. Pt required rest break afterwards and pt transferred WC<>Nustep via lateral scoot with CGA and performed BUE/LE strengthening on Nustep at workload 4 for 10 minutes for a total of 360 steps for improved cardiovascular endurance with gait belt around bilateral knees to prevent excessive R hip abduction and total A to manage RLE on footplate. Nustep<>WC via lateral scoot with CGA and pt transferred sit<>stand x 3  trials at table in dayroom with mod/max A (requiring 3 attempts to stand on first trial) with 1 hand on WC armrest and 1 hand on table with therapist blocking R knee. Worked on dScientific laboratory technicianwith legos with 1 UE x 2 trials with min A for balance. Pt unable to stay standing long enough to complete picture 2; therefore returned to sitting to complete picture with min cues. Pt with continued difficulty putting weight on RLE due to fear of buckling. Pt did report being able to feel weakness and shaking on RLE while standing. [facility] and agreed to stay sitting in WC to eat lunch. Concluded session with pt sitting in WC, needs within reach, and chair pad alarm on.   Therapy Documentation Precautions:  Precautions Precautions: Fall,Back Precaution Comments: Back precautions, TLSO EOB Required Braces or Orthoses: Spinal Brace Spinal Brace: Thoracolumbosacral orthotic,Applied in sitting position Restrictions Weight Bearing Restrictions: No  Therapy/Group: Individual Therapy AAlfonse AlpersPT, DPT   05/16/2020, 7:23 AM

## 2020-05-16 NOTE — Consult Note (Signed)
Neuropsychological Consultation   Patient:   Timothy Ware   DOB:   09-16-1934  MR Number:  [id number]  Location:  [facility] [address] CENTER A [id number] [place] [address] Dept: [phone] Loc: [phone]           Date of Service:   05/15/2020  Start Time:   10 AM End Time:   11 AM  Provider/Observer:  Arley Phenix, Psy.D.       Clinical Neuropsychologist       Billing Code/Service: [phone]  Chief Complaint:    Timothy Ware is an 86 year old male with a history of hypertension, CAD with angioplasty, chronic kidney disease stage III as well as chronic back pain with multiple prior back surgeries.  Patient is status post radiculopathy status post transpedicular microdiscectomy T10-11 early December 2021 and the patient was treated post surgery in the inpatient rehab services between 02/28/2020 to 03/22/2020.  I saw the patient during his previous hospitalization.  Patient began having increasing difficulty transferring from his bed to his wheelchair with drop arm bedside commode and only limited ambulation.  Patient needed assistance for ADLs.  Prior to initial back surgery in December the patient was independent.  Patient presented on 04/25/2020 with increasing right lower extremity weakness and numbness.  X-ray and imaging showed thoracic spinal stenosis recurrent disc herniation with spinal instability.  Patient underwent right transpedicular decompression microdiscectomy at T10-11 with pedicle screw fixation.  Thoracic T10-12 with posterolateral arthrodesis 05/02/2020.  Placed on Lovenox for DVT prophylaxis.  Patient returned to the inpatient comprehensive rehabilitation unit due to decrease in functional mobility.  Reason for Service:  Patient was referred for neuropsychological consultation due to coping and adjustment issues with significant loss of functioning after is now second significant back surgery in  a setting of multiple prior back surgeries.  Patient has developed stenosis in both of his previous 2 surgeries.  Patient with significant muscle weakness and numbness.  Below is the HPI for the current admission.  Timothy Ware is a 86 year old right-handed male with history of hypertension, CAD with angioplasty, CKD stage III as well as chronic back pain withmultiple back surgeries status postradiculopathy status post transpedicular microdiscectomy T10-11 early December 2021and received inpatient rehab services 02/28/2020 03/22/2020. Per chart review patient lives with spouse. Patient reported transferring bed to wheelchair drop arm bedside commode only very limited ambulation. Needing assist for ADLs. Prior to initial back surgery in December reportedly independent. Two-level home bed bath main level. One-step to entry. Son and wife with good support. Presented 04/25/2020 with increasing right lower extremity weakness and numbness. Latest chemistries 05/01/2020 hemoglobin 11.6 hematocrit 36.9. 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 Underwent right transpedicular decompression microdiscectomy at T10-11 with pedicle screw fixation. Thoracic 10-12with posterior lateral arthrodesis 05/02/2020 per Dr. Venetia Maxon. Placed on Lovenox for DVT prophylaxis. Back brace when out of bed applied in sitting position. therapy evaluations completed due to patient's decrease in functional mobility was admitted for a comprehensive rehab program.  Current Status:  The patient was alert and oriented with positive mood state.  Patient easily recalled our visit back in December 2021 and we discussed some of the issues that were reviewed backbend.  Patient's memory is quite good for both short-term and long-term memory functions.  Good expressive language.  Patient acknowledged difficulties coping with significant loss of function and concerns about his recovery  and healing primarily due to his age.  Overall, the patient is in generally good health for his age with kidney issues and previous back surgeries.   Behavioral Observation: Timothy Ware  presents as a 86 y.o.-year-old Right Caucasian Male who appeared his stated age. his dress was Appropriate and he was Well Groomed and his manners were Appropriate to the situation.  his participation was indicative of Appropriate and Attentive behaviors.  There were physical disabilities noted.  he displayed an appropriate level of cooperation and motivation.     Interactions:    Active Appropriate and Attentive  Attention:   within normal limits and attention span and concentration were age appropriate  Memory:   within normal limits; recent and remote memory intact  Visuo-spatial:  not examined  Speech (Volume):  normal  Speech:   normal; normal  Thought Process:  Coherent and Relevant  Though Content:  WNL; not suicidal and not homicidal  Orientation:   person, place, time/date and situation  Judgment:   Good  Planning:   Good  Affect:    Appropriate  Mood:    Euthymic  Insight:   Good  Intelligence:   high  Medical History:   Past Medical History:  Diagnosis Date  . Anemia   . Back pain with radiculopathy   . Coronary artery disease   . DJD (degenerative joint disease)   . Hypertension   . PONV (postoperative nausea and vomiting)   . Vitamin D deficiency          Patient Active Problem List   Diagnosis Date Noted  . Sleep disturbance   . Acute deep vein thrombosis (DVT) of femoral vein of left lower extremity (HCC)   . Stage 3a chronic kidney disease (HCC)   . Neurogenic bowel   . Lower extremity weakness 04/26/2020  . Urinary retention   . Acute blood loss anemia   . Thrombocytopenia (HCC)   . Leucocytosis   . Orthostatic hypotension   . Neurogenic bladder   . Elevated creatine kinase   . Elevated BUN   . Elevated serum creatinine   . Bradycardia   .  Steroid-induced hyperglycemia   . Labile blood pressure   . Hypoalbuminemia due to protein-calorie malnutrition (HCC)   . Essential hypertension   . Postoperative pain   . Thoracic myelopathy 02/28/2020  . HNP (herniated nucleus pulposus with myelopathy), thoracic 02/25/2020  . Thoracic disc herniation 02/25/2020     Psychiatric History:  No prior psychiatric history  Family Med/Psych History: History reviewed. No pertinent family history.  Impression/DX:  Remer Couse is an 86 year old male with a history of hypertension, CAD with angioplasty, chronic kidney disease stage III as well as chronic back pain with multiple prior back surgeries.  Patient is status post radiculopathy status post transpedicular microdiscectomy T10-11 early December 2021 and the patient was treated post surgery in the inpatient rehab services between 02/28/2020 to 03/22/2020.  I saw the patient during his previous hospitalization.  Patient began having increasing difficulty transferring from his bed to his wheelchair with drop arm bedside commode and only limited ambulation.  Patient needed assistance for ADLs.  Prior to initial back surgery in December the patient was independent.  Patient presented on 04/25/2020 with increasing right lower extremity weakness and numbness.  X-ray and imaging showed thoracic spinal stenosis recurrent disc herniation with spinal instability.  Patient underwent right transpedicular decompression microdiscectomy at T10-11 with pedicle screw fixation.  Thoracic T10-12 with posterolateral arthrodesis 05/02/2020.  Placed on Lovenox for DVT prophylaxis.  Patient returned to  the inpatient comprehensive rehabilitation unit due to decrease in functional mobility.  The patient was alert and oriented with positive mood state.  Patient easily recalled our visit back in December 2021 and we discussed some of the issues that were reviewed backbend.  Patient's memory is quite good for both short-term and  long-term memory functions.  Good expressive language.  Patient acknowledged difficulties coping with significant loss of function and concerns about his recovery and healing primarily due to his age.  Overall, the patient is in generally good health for his age with kidney issues and previous back surgeries.  Disposition/Plan:  Today we worked on coping and adjustment issues around significant loss of function post spinal stenosis and now his second surgical intervention for this in a setting of multiple prior back surgeries.  Patient is struggling with loss of function and concern about healing progress due to age.  I will follow up with the patient later in his inpatient stay if needed.          Electronically Signed   _______________________ Arley Phenix, Psy.D. Clinical Neuropsychologist

## 2020-05-16 NOTE — Patient Care Conference (Incomplete)
Inpatient RehabilitationTeam Conference and Plan of Care Update Date: 05/16/2020   Time: 2:09 AM    Patient Name: Timothy Ware      Medical Record Number: [id number]  Date of Birth: 10-17-1934 Sex: Male         Room/Bed: 5C04C/5C04C-01 Payor Info: Payor: MEDICARE / Plan: MEDICARE PART A AND B / Product Type: *No Product type* /    Admit Date/Time:  05/06/2020  2:37 PM  Primary Diagnosis:  Thoracic myelopathy  Hospital Problems: Principal Problem:   Thoracic myelopathy Active Problems:   Stage 3a chronic kidney disease (HCC)   Neurogenic bowel   Acute deep vein thrombosis (DVT) of femoral vein of left lower extremity (HCC)   Sleep disturbance    Expected Discharge Date: Expected Discharge Date: 05/31/20  Team Members Present:       Current Status/Progress Goal Weekly Team Focus  Bowel/Bladder    Pt continent of B/B. LBM 05/14/20   Pt remains continent     Swallow/Nutrition/ Hydration             ADL's             Mobility   bed mobility min A, lateral scoot/squat<>pivot transfers CGA, sit<>stand min/mod A blocking RLE with RW, WC mobility >142ft supervision  mod I bed mobility, supervision transfers, mod A standing, mod I WC mobility  functional mobility/transfers, generalized strengthening, dynamic standing balance/coordination, endurance, D/C planning   Communication             Safety/Cognition/ Behavioral Observations            Pain    Pain and spasms in BLE and back. Pain and spasms maintained by PRN medication.  Continue current PRN regiment to aid in pain and spasms.     Skin     Surgical incision on back healing with no s/s of infection.   No skin breakdown       Discharge Planning:      Team Discussion: *** Patient on target to meet rehab goals: {IP REHAB YES/NO WITH TFTDDUKGU:54270}  *See Care Plan and progress notes for long and short-term goals.   Revisions to Treatment Plan:  ***  Teaching Needs: ***  Current Barriers to  Discharge: {BARRIERS TO WCBJSEGBT:51761}  Possible Resolutions to Barriers: ***     Medical Summary               I attest that I was present, lead the team conference, and concur with the assessment and plan of the team.   April Holding 05/16/2020, 2:09 AM

## 2020-05-17 LAB — CBC
HCT: 27.4 % — ABNORMAL LOW (ref 39.0–52.0)
Hemoglobin: 9.2 g/dL — ABNORMAL LOW (ref 13.0–17.0)
MCH: 30.4 pg (ref 26.0–34.0)
MCHC: 33.6 g/dL (ref 30.0–36.0)
MCV: 90.4 fL (ref 80.0–100.0)
Platelets: 333 10*3/uL (ref 150–400)
RBC: 3.03 MIL/uL — ABNORMAL LOW (ref 4.22–5.81)
RDW: 14 % (ref 11.5–15.5)
WBC: 8.1 10*3/uL (ref 4.0–10.5)
nRBC: 0 % (ref 0.0–0.2)

## 2020-05-17 NOTE — Progress Notes (Signed)
Patient ID: Timothy Ware, male   DOB: 1934/07/29, 86 y.o.   MRN: [id number] Met with pt and spoke with wife to discuss team conference progress in therapies and discharge still 3/9. Pt feels he doesn't need OT but more PT and wants to be in the gym more to work on his legs. Both in agreement with discharge needs and plan.

## 2020-05-17 NOTE — Progress Notes (Signed)
Occupational Therapy Session Note  Patient Details  Name: Timothy Ware MRN: [id number] Date of Birth: 23-Nov-1934  Today's Date: 05/17/2020 OT Individual Time: 1018-1100 and 3474-2595 OT Individual Time Calculation (min): 42 min and 69 min   Short Term Goals: Week 2:  OT Short Term Goal 1 (Week 2): Pt will complete LB dressing with min assist with use of AE as needed OT Short Term Goal 2 (Week 2): Pt will complete toileting with min assist OT Short Term Goal 3 (Week 2): Pt will complete sit > stand with min assist to progress to clothing management at sit> stand level  Skilled Therapeutic Interventions/Progress Updates:    1) Treatment session with focus on self-care transfers and problem solving toileting needs.  Pt received upright in w/c agreeable to therapy session.  Engaged in blocked practice lateral scoot transfers w/c <> drop arm BSC.  Pt setup transfer to simulate home setup.  Completed transfers with CGA.   Pt able to push up in to partial standing and reports wife typically pulls pants up/down while pt maintains boosted up position.  Completed as per home routine.  Pt engaged in w/c mobility >300' for BUE strengthening and endurance.  Pt transferred back to bed at end of session lateral scoot CGA.  Pt doffed TLSO and returned to supine via log roll technique requiring assist to lift RLE.  Pt remained supine in bed with all needs in reach.  2) Treatment session with focus on sit > stand, dynamic standing balance, and functional transfers.  Pt received supine in bed with TR present.  Pt agreeable to therapy session.  Discussed current progress towards goals and d/c date.  Therapist provided pt with calendar with d/c date identified to allow pt to keep up with progress and current day of the week.  Pt donned TLSO with setup to bring around back, pt then able to fasten all pieces. Pt completed lateral scoot transfer CGA to w/c.  Pt propelled w/c to therapy gym, on/off elevator with  supervision.  Engaged in sit > stand outside parallel bars with min assist and cues for hand placement.  Engaged in weight shifting in standing with use of mirror for visual feedback and intermittent support from therapist at R knee to facilitate activation.  Pt completed 2 sets of 10 hip bumps in standing to increase weight shifting and weight bearing through RLE.  Engaged in toe tapping with RLE with pt able to lift RLE, utilizing hip hike to lift and advance RLE with no knee activation.  Then challenged pt to attempt to step forward with LLE.  Pt able to weight shift and scoot LLE forward, requiring mod blocking at RLE and pt reports heavy reliance through BUE on grab bar.  Attempted to stand for weight shifting and toe tapping with pt reporting RLE "given out" therefore returned to sitting.  Pt returned to unit via w/c supervision.  Pt reports need to toilet.  Completed lateral scoot transfer w/c > drop arm BSC over toilet with CGA.  Pt able to boost up to allow therapist to pull pants down over hips.  Pt voided bowel and bladder, completing hygiene and then therapist assist to pull pants back over hips while boosting up.  Pt completed lateral scoot transfer CGA back to w/c.  Pt then completed transfer back to bed via lateral scoot CGA.  Pt doffed TLSO and returned to supine with supervision.   Pt remained in supine with all needs in reach.  Therapy Documentation Precautions:  Precautions Precautions: Fall,Back Precaution Comments: Back precautions, TLSO EOB Required Braces or Orthoses: Spinal Brace Spinal Brace: Thoracolumbosacral orthotic,Applied in sitting position Restrictions Weight Bearing Restrictions: No General:   Vital Signs: Therapy Vitals Temp: 98.6 F (37 C) Temp Source: Oral Pulse Rate: 75 Resp: 16 BP: 124/60 Patient Position (if appropriate): Lying Oxygen Therapy SpO2: 100 % O2 Device: Room Air Pain:  Pt with no c/o pain   Therapy/Group: Individual Therapy  Rosalio Loud 05/17/2020, 3:01 PM

## 2020-05-17 NOTE — Progress Notes (Signed)
Physical Therapy Session Note  Patient Details  Name: Timothy Ware MRN: [id number] Date of Birth: December 21, 1934  Today's Date: 05/17/2020 PT Individual Time: 8250-5397 PT Individual Time Calculation (min): 56 min   Short Term Goals: Week 1:  PT Short Term Goal 1 (Week 1): Pt will be able to perform min assist squat pivot transfers bed <> w/c PT Short Term Goal 1 - Progress (Week 1): Met PT Short Term Goal 2 (Week 1): Pt will be able to perform sit <> stand with max assist PT Short Term Goal 2 - Progress (Week 1): Met PT Short Term Goal 3 (Week 1): Pt will be able to tolerate OOB in chair x 2 hours a day PT Short Term Goal 3 - Progress (Week 1): Progressing toward goal Week 2:  PT Short Term Goal 1 (Week 2): pt will perform bed mobility with CGA overall PT Short Term Goal 2 (Week 2): pt will be able to tolerate OOB in chair x 2 hours a day PT Short Term Goal 3 (Week 2): pt will verbalize and demonstrate understanding of back precautions  Skilled Therapeutic Interventions/Progress Updates:   Received pt supine in bed, pt agreeable to therapy, and denied any pain at rest but reported increased low back pain with mobility. Pt reported having "interesting" night last night and being in a "fog" and having urge to use restroom and wanting to walk to bathroom. He reported nursing got to him fast enough and laid him back down. Pt stated "I don't know why I tried to do that, I know I can't walk". Session with emphasis on functional mobility/transfers, generalized strengthening, dynamic standing balance/coordination, and improved activity tolerance. Pt transferred supine<>sitting EOB using logroll technique with supervision and donned TLSO with min A and shoes with total A for time management purposes. Pt transferred bed<>WC via lateral scoot with CGA and performed WC mobility >520f using BUE and supervision from [facility] down [facility], and to 4W therapy gym. Pt able to set up transfer to mat with supervision and  able to lift RLE off footplate using momentum but without assist. Pt transferred sit<>stand with RW and min A x 3 trials and worked on dynamic standing balance tossing horseshoes with RUE with min A for balance and with occasional assist to block R knee x 3 trials. However, pt demonstrates improvements in overall ability to extend R knee and shift weight to R. Pt reports not being able to feel when R knee is straight but can feel when it gets "wobbly". Transitioned to standing and lifting LLE from floor briefly 2x5 reps with emphasis on full weight shifting to RLE with therapist blocking R knee. Pt with increased difficulty maintaining R knee extension with this activity, requiring increased manual facilitation from therapist. Pt initally relying heavily on BUE on RW but with increased repetitions able to put more weight on RLE. Lateral scoot mat<>WC with CGA and pt [facility]. Concluded session with pt sitting in WC, needs within reach, and chair pad alarm on awaiting OT session.   Therapy Documentation Precautions:  Precautions Precautions: Fall,Back Precaution Comments: Back precautions, TLSO EOB Required Braces or Orthoses: Spinal Brace Spinal Brace: Thoracolumbosacral orthotic,Applied in sitting position Restrictions Weight Bearing Restrictions: No  Therapy/Group: Individual Therapy AAlfonse AlpersPT, DPT   05/17/2020, 7:22 AM

## 2020-05-17 NOTE — Patient Care Conference (Signed)
Inpatient RehabilitationTeam Conference and Plan of Care Update Date: 05/17/2020   Time: 11:40 AM    Patient Name: Timothy Ware      Medical Record Number: [id number]  Date of Birth: 03-23-1935 Sex: Male         Room/Bed: 5C04C/5C04C-01 Payor Info: Payor: MEDICARE / Plan: MEDICARE PART A AND B / Product Type: *No Product type* /    Admit Date/Time:  05/06/2020  2:37 PM  Primary Diagnosis:  Thoracic myelopathy  Hospital Problems: Principal Problem:   Thoracic myelopathy Active Problems:   Stage 3a chronic kidney disease (HCC)   Neurogenic bowel   Acute deep vein thrombosis (DVT) of femoral vein of left lower extremity (HCC)   Sleep disturbance    Expected Discharge Date: Expected Discharge Date: 05/31/20  Team Members Present: Physician leading conference: Dr. Maryla Morrow Care Coodinator Present: Chana Bode, RN, BSN, CRRN;Becky Dupree, LCSW Nurse Present: Despina Hidden, RN PT Present: Sheran Lawless, PT OT Present: Towanda Malkin, OT PPS Coordinator present : Fae Pippin, [place], PT     Current Status/Progress Goal Weekly Team Focus  Bowel/Bladder   Pt continent of B/B. LBM 05/16/20  Pt remains continent  [place] pt PRN and continue bowel regiment   Swallow/Nutrition/ Hydration             ADL's   min assist bed mobility, CGA lateral scoot transfers, min assist sit > stand, min assist LB dressing at bed level/EOB, Supervision to don shirt - requires assist to don TLSO  Min A toileting, CGA transfers and bathing/dressing  ADL retraining, transfers, sit > stand, dynamic standing balance, activity tolerance/endurance, pain management   Mobility   bed mobility min A, lateral scoot/squat<>pivot transfers CGA, sit<>stand min/mod A blocking RLE with RW, WC mobility >136ft supervision  mod I bed mobility, supervision transfers, mod A standing, mod I WC mobility  functional mobility/transfers, generalized strengthening, dynamic standing balance/coordination, endurance, D/C  planning   Communication             Safety/Cognition/ Behavioral Observations            Pain   Pain and spasms in BLE and back. Pain and spasms maintained by PRN medication.  Continue current PRN regiment to aid in pain and spasms.  continue PRN medications as needed   Skin   Surgical incision on back healing with no s/s of infection.  No skin breakdown  skin care     Discharge Planning:  Progressing in therapies, slowly. Pt has pain issues and L-LE's very weak from two back surgeries. Pt and family aware will need 24/7 care and be at a wheelchair level like last time   Team Discussion: Bowel and bladder issues improved. Xarelto prescribed per MD for DVT. Baclofen prn for muscle spasms. Patient on target to meet rehab goals: yes, near goal level overall. Currently min assist for ADLs, transfers and supervision for wheelchair mobility.  *See Care Plan and progress notes for long and short-term goals.   Revisions to Treatment Plan:   Teaching Needs: Medications, transfers, toileting, etc.   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  Status post right transpedicular decompression microdiscectomy T10-11 with pedicle screw fixation T10-12 with posterior lateral arthrodesis 05/02/2020  Barriers to Discharge: Medical stability;Decreased family/caregiver support   Possible Resolutions to [employer]: Therapies,  Follow labs- Cr, Hb, optimize BP meds, Xarelto for DVT, optimize pain meds   Continued Need for Acute Rehabilitation Level of Care: The patient requires daily medical management by a physician with specialized training in physical medicine and rehabilitation for the following reasons: Direction of a multidisciplinary  physical rehabilitation program to maximize functional independence : Yes Medical management of patient stability for increased activity during participation in an intensive rehabilitation regime.: Yes Analysis of laboratory values and/or radiology reports with any subsequent need for medication adjustment and/or medical intervention. : Yes   I attest that I was present, lead the team conference, and concur with the assessment and plan of the team.   Chana Bode B 05/17/2020, 3:50 PM

## 2020-05-17 NOTE — Progress Notes (Addendum)
PROGRESS NOTE   Subjective/Complaints: Patient seen laying in bed this morning.  He states he slept well overnight.  He notes he had some disorientation earlier a.m. when he tried to get out of bed, but notes that he cannot ambulate.  ROS: Denies CP, SOB, N/V/D  Objective:   No results found. Recent Labs    05/15/20 0506 05/17/20 0658  WBC 9.5 8.1  HGB 9.2* 9.2*  HCT 29.7* 27.4*  PLT 337 333   Recent Labs    05/15/20 0506  NA 137  K 3.6  CL 104  CO2 26  GLUCOSE 100*  BUN 17  CREATININE 1.16  CALCIUM 8.4*    Intake/Output Summary (Last 24 hours) at 05/17/2020 1027 Last data filed at 05/17/2020 0730 Gross per 24 hour  Intake 780 ml  Output 1300 ml  Net -520 ml        Physical Exam: Vital Signs Blood pressure 121/61, pulse 72, temperature 97.7 F (36.5 C), temperature source Oral, resp. rate 16, height 6\' 1"  (1.854 m), weight 74.2 kg, SpO2 96 %.  Constitutional: No distress . Vital signs reviewed. HENT: Normocephalic.  Atraumatic. Eyes: EOMI. No discharge. Cardiovascular: No JVD.  RRR. Respiratory: Normal effort.  No stridor.  Bilateral clear to auscultation. GI: Non-distended.  BS +. Skin: Warm and dry.  Intact. Psych: Normal mood.  Normal behavior. Musc: No edema in extremities.  No tenderness in extremities. Neuro: Alert Motor: B/l UE: 5/5 proximal to distal LLE: 4+/5 proximal to distal, unchanged RLE: HF and KE 4-/5 (pain component too), ADF 4+/5, unchanged Sensation remains diminished to light touch RLE, prox more than distal  Assessment/Plan: 1. Functional deficits which require 3+ hours per day of interdisciplinary therapy in a comprehensive inpatient rehab setting.  Physiatrist is providing close team supervision and 24 hour management of active medical problems listed below.  Physiatrist and rehab team continue to assess barriers to discharge/monitor patient progress toward functional and  medical goals  Care Tool:  Bathing  Bathing activity did not occur: Refused           Bathing assist       Upper Body Dressing/Undressing Upper body dressing Upper body dressing/undressing activity did not occur (including orthotics): Refused What is the patient wearing?: Pull over shirt,Orthosis Orthosis activity level: Performed by helper  Upper body assist Assist Level: Minimal Assistance - Patient > 75% (Supervision pullover shirt, Min to include orthosis)    Lower Body Dressing/Undressing Lower body dressing    Lower body dressing activity did not occur: Refused What is the patient wearing?: Pants     Lower body assist Assist for lower body dressing: Minimal Assistance - Patient > 75%     Toileting Toileting Toileting Activity did not occur (Clothing management and hygiene only): N/A (no void or bm)  Toileting assist Assist for toileting: Minimal Assistance - Patient > 75%     Transfers Chair/bed transfer  Transfers assist  Chair/bed transfer activity did not occur: Refused  Chair/bed transfer assist level: Contact Guard/Touching assist (lateral scoot)     Locomotion Ambulation   Ambulation assist   Ambulation activity did not occur: Safety/medical concerns (pain and unable to stand)  Walk 10 feet activity   Assist  Walk 10 feet activity did not occur: Safety/medical concerns        Walk 50 feet activity   Assist Walk 50 feet with 2 turns activity did not occur: Safety/medical concerns         Walk 150 feet activity   Assist Walk 150 feet activity did not occur: Safety/medical concerns         Walk 10 feet on uneven surface  activity   Assist Walk 10 feet on uneven surfaces activity did not occur: Safety/medical concerns         Wheelchair     Assist Will patient use wheelchair at discharge?: Yes Type of Wheelchair: Manual    Wheelchair assist level: Supervision/Verbal cueing Max wheelchair distance:  >575ft    Wheelchair 50 feet with 2 turns activity    Assist        Assist Level: Supervision/Verbal cueing   Wheelchair 150 feet activity     Assist      Assist Level: Supervision/Verbal cueing   Medical Problem List and Plan: 1.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 Status post right transpedicular decompression microdiscectomy T10-11 with pedicle screw fixation T10-12 with posterior lateral arthrodesis 05/02/2020. Back brace when out of bed  Continue CIR  PRAFO nightly  Team conference today to discuss current and goals and coordination of care, home and environmental barriers, and discharge planning with nursing, case manager, and therapies. Please see conference note from today as well.  2. Antithrombotics: -DVT/anticoagulation: Continue Xarelto  Left femoral and popliteal DVT -antiplatelet therapy: N/A 3. Pain Management:Continue Baclofen 10 mg 3 times daily, hydrocodone as needed. Robaxin as needed  Gabapentin 300 nightly started on 2/17  Controlled with meds on 2/23  Monitor with increased mobility 4. Mood:Provide emotional support -antipsychotic agents: N/A 5. Neuropsych: This patientiscapable of making decisions on hisown behalf. 6. Skin/Wound Care: Routine skin care  7. Fluids/Electrolytes/Nutrition:Routine in and outs 8. Hypertension.     Tenormin decreased to 12.5mg   Controlled on 2/23  Monitor with increased mobility 9. Neurogenic bowel andbladder.   PVRs relatively unremarkable   improving  10. Hyperlipidemia. Lipitor 11. CKD stage III. Creatinine baseline 1.27.   Creatinine 1.16 on 2/21 12. Nausea:?  Resolved 13.  Sleep disturbance  Melatonin started on 2/16  Improving 14.  Acute blood loss anemia  Hemoglobin 9.2 on 2/23  Hemoccult negative   LOS: 11 days A FACE TO FACE EVALUATION WAS PERFORMED  Ankit [name] 05/17/2020, 10:27 AM

## 2020-05-17 NOTE — Evaluation (Signed)
Recreational Therapy Assessment and Plan  Patient Details  Name: Timothy Ware MRN: [id number] Date of Birth: [birth year] Today's Date: 05/17/2020  Rehab Potential:  Good ELOS:   d/c 3/9  Assessment Hospital Problem: Principal Problem:   Thoracic myelopathy   Past Medical History:      Past Medical History:  Diagnosis Date  . Anemia   . Back pain with radiculopathy   . Coronary artery disease   . DJD (degenerative joint disease)   . Hypertension   . PONV (postoperative nausea and vomiting)   . Vitamin D deficiency    Past Surgical History:       Past Surgical History:  Procedure Laterality Date  . BACK SURGERY    . CARDIAC CATHETERIZATION  10/11/1996   NL LM, insignificant LAD, 75% pRCA, 95% dRCA, 75% OM2, EF 72%. S/p RCA stents x2 & CX x1.  Marland Kitchen CARPAL TUNNEL RELEASE Bilateral   . CORONARY ANGIOPLASTY     RCA PTCA/stent 10/29/1994; RCA stents x2, Cx x1, DUHS 10/11/1996  . ELBOW SURGERY Left   . EYE SURGERY Bilateral    cataract  . ROTATOR CUFF REPAIR Left   . THORACIC DISCECTOMY N/A 02/25/2020   Procedure: TRANSPEDICULAR MICRODISCECTOMY THORACIC TEN THORACIC ELEVEN;  Surgeon: Erline Levine, MD;  Location: [place];  Service: Neurosurgery;  Laterality: N/A;  posterior    Assessment & Plan Clinical Impression: Patient is a 86 year old right-handed male with history of hypertension, CAD with angioplasty, CKD stage III as well as chronic back pain withmultiple back surgeries status postradiculopathy status post transpedicular microdiscectomy T10-11 early December 2021and received inpatient rehab services 02/28/2020 03/22/2020. Per chart review patient lives with spouse. Patient reported transferring bed to wheelchair drop arm bedside commode only very limited ambulation. Needing assist for ADLs. Prior to initial back surgery in December reportedly independent. Two-level home bed bath main level. One-step to entry. Son and wife with good support.  Presented 04/25/2020 with increasing right lower extremity weakness and numbness. Latest chemistries 05/01/2020 hemoglobin 11.6 hematocrit 36.9. 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 Underwent right transpedicular decompression microdiscectomy at T10-11 with pedicle screw fixation. Thoracic 10-12with posterior lateral arthrodesis 05/02/2020 per Dr. Vertell Limber. Placed on Lovenox for DVT prophylaxis. Back brace when out of bed applied in sitting position. therapy evaluations completed due to patient's decrease in functional mobility was admitted for a comprehensive rehab program.Patient transferred to CIR on 05/06/2020 .   Pt presents with decreased activity tolerance, decreased functional mobility, decreased balance Limiting pt's independence with leisure/community pursuits.  Met with pt today to discuss leisure interests, activity analysis and potential modifications.  Pt easily states interests and potential modifications with independence.  Pt states he enjoys staying busy "piddling with things".  "Piddling" is also relaxation for him.  Pt states frustration with another surgery and hospitalization.     Plan  No further TR as pt is not interested.  Recommendations for other services: None   Discharge Criteria: Patient will be discharged from TR if patient refuses treatment 3 consecutive times without medical reason.  If treatment goals not met, if there is a change in medical status, if patient makes no progress towards goals or if patient is discharged from hospital.  The above assessment, treatment plan, treatment alternatives and goals were discussed and mutually agreed upon: by patient  [place] 05/17/2020, 8:50 AM

## 2020-05-18 NOTE — Progress Notes (Signed)
Physical Therapy Session Note  Patient Details  Name: Timothy Ware MRN: [id number] Date of Birth: [birth year]  Today's Date: 05/18/2020 PT Individual Time: 9381-8299 and 3716-9678 PT Individual Time Calculation (min): 40 min and 69 min  Short Term Goals: Week 1:  PT Short Term Goal 1 (Week 1): Pt will be able to perform min assist squat pivot transfers bed <> w/c PT Short Term Goal 1 - Progress (Week 1): Met PT Short Term Goal 2 (Week 1): Pt will be able to perform sit <> stand with max assist PT Short Term Goal 2 - Progress (Week 1): Met PT Short Term Goal 3 (Week 1): Pt will be able to tolerate OOB in chair x 2 hours a day PT Short Term Goal 3 - Progress (Week 1): Progressing toward goal Week 2:  PT Short Term Goal 1 (Week 2): pt will perform bed mobility with CGA overall PT Short Term Goal 2 (Week 2): pt will be able to tolerate OOB in chair x 2 hours a day PT Short Term Goal 3 (Week 2): pt will verbalize and demonstrate understanding of back precautions  Skilled Therapeutic Interventions/Progress Updates:   Treatment Session 1: 9381-0175 40 min Received pt supine in bed, pt agreeable to therapy, and reported low back pain 8/10 with mobility but declined pain interventions. Session with emphasis on functional mobility/transfers, generalized strengthening, dynamic standing balance/coordination, NMR, and improved activity tolerance. Supine<>sitting EOB from flat bed with supervision and use of bedrails with min cues for logroll technique. Pt donned TLSO with set up assist once therapist placed it around back. Donned shoes with total A and pt transferred bed<>WC squat<>pivot with CGA/close supervision. Pt performed WC mobility >529f from 5C, down elevators, and to 47M therapy gym with supervision with emphasis on speed and endurance. Pt performed BUE strengthening on UBE at level 7 for 3.5 minutes forward and 3.5 minutes backwards. Pt transferred sit<>stand with RW and min/mod A x 2 trials from  [facility] worked on pre-gait stepping with LLE 1x5 and 1x10 reps with emphasis on weight shifting to R, R knee extension, and upright posture. Therapist guarding R knee and only providing manual facilitation to block R knee buckling ~50% of time while stepping. However pt did demonstrate frequent R knee flexion in stance but was able to correct ~50% of time. Pt transported back to [facility]. Concluded session with pt sitting in WC, needs within reach, and chair pad alarm on.   Treatment Session 2: [address] 78min Received pt supine in bed, pt agreeable to therapy, and reported pain 3/10 in low back but declined pain interventions. Session with emphasis on functional mobility/transfers, generalized strengthening, dynamic standing balance/coordination, gait training, NMR, and improved activity tolerance. Supine<>sitting EOB from flat bed with supervision and use of bedrails with min cues for logroll technique. Pt donned TLSO with set up assist once therapist placed it around back. Donned shoes with total A and pt transferred bed<>WC squat<>pivot with CGA/close supervision. Pt performed WC mobility >5072ffrom 5C, down elevators, and to 47M therapy gym with supervision. Pt able to set up transfer to mat with supervision and transferred WC<>mat via squat<>pivot with CGA. Donned [employer] harness and pt transferred sit<>stand with mod A with RW and ambulated [id number] and 4 ft backwards x 1 trial and 3ft27frwards and 3 ft backwards x 1 trial with RW and mod/max A. Pt required max cues for sequencing of stepping and RW management and total A to advance RLE  due to weakness. Pt only required ~25% manual facilitation to block R knee buckling but pt with R knee flexed in stance. Pt requested to stop due to UE fatigue. Pt performed the following activities standing in [place] with BUE support on RW: -standing without UE support for ~20 seconds x 1 and ~30 seconds x 1 with therapist guarding R knee but no  buckling noted -LLE toe taps to 3in step 2x10 reps with BUE support on RW and manual facilitation to prevent R knee buckling Pt required multiple rest breaks throughout session due to increased fatigue. Pt transferred mat<>WC via lateral scoot and transported back to [facility] in [place] total A. Pt requested to return to bed and transferred WC<>bed via squat<>pivot and doffed TLSO with min A and shoes with total A. Sit<>supine with min A for RLE management. Concluded session with pt supine in bed, needs within reach, and bed alarm on. Therapist provided fresh ice water for pt.   Therapy Documentation Precautions:  Precautions Precautions: Fall,Back Precaution Comments: Back precautions, TLSO EOB Required Braces or Orthoses: Spinal Brace Spinal Brace: Thoracolumbosacral orthotic,Applied in sitting position Restrictions Weight Bearing Restrictions: No  Therapy/Group: Individual Therapy Alfonse Alpers PT, DPT   05/18/2020, 7:21 AM

## 2020-05-18 NOTE — Progress Notes (Signed)
PROGRESS NOTE   Subjective/Complaints: Patient seen laying in bed this AM.  He states he slept well overnight.  He has questions regarding results of Hemocult.   ROS: Denies CP, SOB, N/V/D  Objective:   No results found. Recent Labs    05/17/20 0658  WBC 8.1  HGB 9.2*  HCT 27.4*  PLT 333   No results for input(s): NA, K, CL, CO2, GLUCOSE, BUN, CREATININE, CALCIUM in the last 72 hours.  Intake/Output Summary (Last 24 hours) at 05/18/2020 1641 Last data filed at 05/18/2020 1445 Gross per 24 hour  Intake 717 ml  Output 1200 ml  Net -483 ml        Physical Exam: Vital Signs Blood pressure (!) 134/55, pulse 74, temperature 98.4 F (36.9 C), temperature source Oral, resp. rate 18, height 6\' 1"  (1.854 m), weight 74.2 kg, SpO2 99 %.  Constitutional: No distress . Vital signs reviewed. HENT: Normocephalic.  Atraumatic. Eyes: EOMI. No discharge. Cardiovascular: No JVD.  RRR. Respiratory: Normal effort.  No stridor.  Bilateral clear to auscultation. GI: Non-distended.  BS +. Skin: Warm and dry.  Intact. Psych: Normal mood.  Normal behavior. Musc: No edema in extremities.  No tenderness in extremities. Neuro: Alert Motor: B/l UE: 5/5 proximal to distal LLE: 4+/5 proximal to distal, unchanged RLE: HF and KE 4-/5 (pain component too), ADF 4+/5, stable Sensation remains diminished to light touch RLE, prox more than distal  Assessment/Plan: 1. Functional deficits which require 3+ hours per day of interdisciplinary therapy in a comprehensive inpatient rehab setting.  Physiatrist is providing close team supervision and 24 hour management of active medical problems listed below.  Physiatrist and rehab team continue to assess barriers to discharge/monitor patient progress toward functional and medical goals  Care Tool:  Bathing  Bathing activity did not occur: Refused           Bathing assist       Upper Body  Dressing/Undressing Upper body dressing Upper body dressing/undressing activity did not occur (including orthotics): Refused What is the patient wearing?: Pull over shirt,Orthosis Orthosis activity level: Performed by helper  Upper body assist Assist Level: Minimal Assistance - Patient > 75% (Supervision pullover shirt, Min to include orthosis)    Lower Body Dressing/Undressing Lower body dressing    Lower body dressing activity did not occur: Refused What is the patient wearing?: Pants     Lower body assist Assist for lower body dressing: Minimal Assistance - Patient > 75%     Toileting Toileting Toileting Activity did not occur (Clothing management and hygiene only): N/A (no void or bm)  Toileting assist Assist for toileting: Minimal Assistance - Patient > 75%     Transfers Chair/bed transfer  Transfers assist  Chair/bed transfer activity did not occur: Refused  Chair/bed transfer assist level: Contact Guard/Touching assist     Locomotion Ambulation   Ambulation assist   Ambulation activity did not occur: Safety/medical concerns (pain and unable to stand)  Assist level: Maximal Assistance - Patient 25 - 49% Assistive device: Maxi Sky Max distance: 66ft   Walk 10 feet activity   Assist  Walk 10 feet activity did not occur: Safety/medical concerns  Walk 50 feet activity   Assist Walk 50 feet with 2 turns activity did not occur: Safety/medical concerns         Walk 150 feet activity   Assist Walk 150 feet activity did not occur: Safety/medical concerns         Walk 10 feet on uneven surface  activity   Assist Walk 10 feet on uneven surfaces activity did not occur: Safety/medical concerns         Wheelchair     Assist Will patient use wheelchair at discharge?: Yes Type of Wheelchair: Manual    Wheelchair assist level: Supervision/Verbal cueing Max wheelchair distance: >524ft    Wheelchair 50 feet with 2 turns  activity    Assist        Assist Level: Supervision/Verbal cueing   Wheelchair 150 feet activity     Assist      Assist Level: Supervision/Verbal cueing   Medical Problem List and Plan: 1.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 Status post right transpedicular decompression microdiscectomy T10-11 with pedicle screw fixation T10-12 with posterior lateral arthrodesis 05/02/2020. Back brace when out of bed  Continue CIR  PRAFO nightly 2. Antithrombotics: -DVT/anticoagulation: Continue Xarelto  Left femoral and popliteal DVT -antiplatelet therapy: N/A 3. Pain Management:Continue Baclofen 10 mg 3 times daily, hydrocodone as needed. Robaxin as needed  Gabapentin 300 nightly started on 2/17  Controlled with meds on 2/24  Monitor with increased mobility 4. Mood:Provide emotional support -antipsychotic agents: N/A 5. Neuropsych: This patientiscapable of making decisions on hisown behalf. 6. Skin/Wound Care: Routine skin care  7. Fluids/Electrolytes/Nutrition:Routine in and outs 8. Hypertension.     Tenormin decreased to 12.5mg   Relatively controlled on 2/24  Monitor with increased mobility 9. Neurogenic bowel andbladder.   PVRs relatively unremarkable  Improving  10. Hyperlipidemia. Lipitor 11. CKD stage III. Creatinine baseline 1.27.   Creatinine 1.16 on 2/21 12. Nausea:?  Resolved 13.  Sleep disturbance  Melatonin started on 2/16  Improving 14.  Acute blood loss anemia  Hemoglobin 9.2 on 2/23  Hemoccult negative   LOS: 12 days A FACE TO FACE EVALUATION WAS PERFORMED  Timothy Ware [name] 05/18/2020, 4:41 PM

## 2020-05-18 NOTE — Progress Notes (Signed)
Occupational Therapy Session Note  Patient Details  Name: Timothy Ware MRN: [id number] Date of Birth: [birth year]  Today's Date: 05/18/2020 OT Individual Time: 0700-0755 OT Individual Time Calculation (min): 55 min    Short Term Goals: Week 1:  OT Short Term Goal 1 (Week 1): Pt will use LRAD to don LB clothing with mod A OT Short Term Goal 1 - Progress (Week 1): Met OT Short Term Goal 2 (Week 1): Pt will don shirt with min A OT Short Term Goal 2 - Progress (Week 1): Met OT Short Term Goal 3 (Week 1): Pt will complete BSC transfer with mod A OT Short Term Goal 3 - Progress (Week 1): Met  Skilled Therapeutic Interventions/Progress Updates:    1:1. Pt received in bed. No pain reported. Pt reporting Hg low and very concerned with many questions. Referred questions to MD. Pt completes sup>sitting with MIN A for trunk elevation. Pt dons brace with min A to bring around back. Pt completes lateral scoots throughout with MIN-MOD A (increased A to go to L) with VC for hand placement. Pt propels w/c for BUE endurance to/from all tx destinations and set up. Pt competes towel slides with LUE for knee ext/flex and 3x10 kicks of soccer ball increasing demand/timing to OT rolling ball toward pt. Edu re picking up toe to improve kick/swing all to strengthen LE in prep for transfers. Exited session with pt seated in bed, exit alarm on and call light in reach   Therapy Documentation Precautions:  Precautions Precautions: Fall,Back Precaution Comments: Back precautions, TLSO EOB Required Braces or Orthoses: Spinal Brace Spinal Brace: Thoracolumbosacral orthotic,Applied in sitting position Restrictions Weight Bearing Restrictions: No General:   Vital Signs: Therapy Vitals Temp: 98.4 F (36.9 C) Temp Source: Oral Pulse Rate: 67 Resp: 18 BP: (!) 106/52 Patient Position (if appropriate): Lying Oxygen Therapy SpO2: 98 % O2 Device: Room Air Pain:   ADL: ADL Eating: Independent Grooming: Unable  to assess Upper Body Bathing: Unable to assess Lower Body Bathing: Unable to assess Upper Body Dressing: Unable to assess Lower Body Dressing: Unable to assess Toileting: Unable to assess Toilet Transfer: Unable to assess Tub/Shower Transfer: Unable to assess Tub/Shower Transfer Method: Unable to assess Vision   Perception    Praxis   Exercises:   Other Treatments:     Therapy/Group: Individual Therapy  Tonny Branch 05/18/2020, 6:51 AM

## 2020-05-19 NOTE — Progress Notes (Signed)
Patient c/o stomach cramps.  Patient had a loose bowel movement and asked for something to settle his stomach.  RN gave PRN Mylanta.  RN will re-assess patient to see how the Mylanta worked.

## 2020-05-19 NOTE — Progress Notes (Signed)
PROGRESS NOTE   Subjective/Complaints: Patient seen sitting up in the chair working with therapy this morning.  He states he slept fairly overnight.  He has questions regarding hemoglobin again.  ROS: Denies CP, SOB, N/V/D  Objective:   No results found. Recent Labs    05/17/20 0658  WBC 8.1  HGB 9.2*  HCT 27.4*  PLT 333   No results for input(s): NA, K, CL, CO2, GLUCOSE, BUN, CREATININE, CALCIUM in the last 72 hours.  Intake/Output Summary (Last 24 hours) at 05/19/2020 1033 Last data filed at 05/19/2020 0753 Gross per 24 hour  Intake 714 ml  Output 1000 ml  Net -286 ml        Physical Exam: Vital Signs Blood pressure (!) 128/57, pulse 83, temperature 98.7 F (37.1 C), temperature source Oral, resp. rate 16, height 6\' 1"  (1.854 m), weight 74.2 kg, SpO2 99 %.  Constitutional: No distress . Vital signs reviewed. HENT: Normocephalic.  Atraumatic. Eyes: EOMI. No discharge. Cardiovascular: No JVD.  RRR. Respiratory: Normal effort.  No stridor.  Bilateral clear to auscultation. GI: Non-distended.  BS +. Skin: Warm and dry.  Intact. Psych: Normal mood.  Normal behavior. Musc: No edema in extremities.  No tenderness in extremities. Neuro: Alert Motor: B/l UE: 5/5 proximal to distal LLE very pleasant: 4+/5 proximal to distal, unchanged RLE: HF and KE 4-/5 (pain component too), ADF 4+/5, unchanged Sensation remains diminished to light touch RLE, prox more than distal  Assessment/Plan: 1. Functional deficits which require 3+ hours per day of interdisciplinary therapy in a comprehensive inpatient rehab setting.  Physiatrist is providing close team supervision and 24 hour management of active medical problems listed below.  Physiatrist and rehab team continue to assess barriers to discharge/monitor patient progress toward functional and medical goals  Care Tool:  Bathing  Bathing activity did not occur: Refused            Bathing assist       Upper Body Dressing/Undressing Upper body dressing Upper body dressing/undressing activity did not occur (including orthotics): Refused What is the patient wearing?: Pull over shirt,Orthosis Orthosis activity level: Performed by helper  Upper body assist Assist Level: Minimal Assistance - Patient > 75% (Supervision pullover shirt, Min to include orthosis)    Lower Body Dressing/Undressing Lower body dressing    Lower body dressing activity did not occur: Refused What is the patient wearing?: Pants     Lower body assist Assist for lower body dressing: Minimal Assistance - Patient > 75%     Toileting Toileting Toileting Activity did not occur (Clothing management and hygiene only): N/A (no void or bm)  Toileting assist Assist for toileting: Minimal Assistance - Patient > 75%     Transfers Chair/bed transfer  Transfers assist  Chair/bed transfer activity did not occur: Refused  Chair/bed transfer assist level: Contact Guard/Touching assist     Locomotion Ambulation   Ambulation assist   Ambulation activity did not occur: Safety/medical concerns (pain and unable to stand)  Assist level: Maximal Assistance - Patient 25 - 49% Assistive device: Maxi Sky Max distance: 9ft   Walk 10 feet activity   Assist  Walk 10 feet activity did not  occur: Safety/medical concerns        Walk 50 feet activity   Assist Walk 50 feet with 2 turns activity did not occur: Safety/medical concerns         Walk 150 feet activity   Assist Walk 150 feet activity did not occur: Safety/medical concerns         Walk 10 feet on uneven surface  activity   Assist Walk 10 feet on uneven surfaces activity did not occur: Safety/medical concerns         Wheelchair     Assist Will patient use wheelchair at discharge?: Yes Type of Wheelchair: Manual    Wheelchair assist level: Supervision/Verbal cueing Max wheelchair distance: >522ft     Wheelchair 50 feet with 2 turns activity    Assist        Assist Level: Supervision/Verbal cueing   Wheelchair 150 feet activity     Assist      Assist Level: Supervision/Verbal cueing   Medical Problem List and Plan: 1.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 Status post right transpedicular decompression microdiscectomy T10-11 with pedicle screw fixation T10-12 with posterior lateral arthrodesis 05/02/2020. Back brace when out of bed  Continue CIR  PRAFO nightly 2. Antithrombotics: -DVT/anticoagulation: Continue Xarelto  Left femoral and popliteal DVT -antiplatelet therapy: N/A 3. Pain Management:Continue Baclofen 10 mg 3 times daily, hydrocodone as needed. Robaxin as needed  Gabapentin 300 nightly started on 2/17  Controlled with meds on 2/25  Monitor with increased mobility 4. Mood:Provide emotional support -antipsychotic agents: N/A 5. Neuropsych: This patientiscapable of making decisions on hisown behalf. 6. Skin/Wound Care: Routine skin care  7. Fluids/Electrolytes/Nutrition:Routine in and outs 8. Hypertension.     Tenormin decreased to 12.5mg   Relatively controlled on 2/25  Monitor with increased mobility 9. Neurogenic bowel andbladder.   PVRs relatively unremarkable  Improving  10. Hyperlipidemia. Lipitor 11. CKD stage III. Creatinine baseline 1.27.   Creatinine 1.16 on 2/21, labs ordered for Monday 12. Nausea:?  Resolved 13.  Sleep disturbance  Melatonin started on 2/16  Improving overall 14.  Acute blood loss anemia  Hemoglobin 9.2 on 2/23, labs ordered for Monday  Hemoccult negative   LOS: 13 days A FACE TO FACE EVALUATION WAS PERFORMED  Janelli Welling [name] 05/19/2020, 10:33 AM

## 2020-05-19 NOTE — Progress Notes (Signed)
Occupational Therapy Session Note  Patient Details  Name: Timothy Ware MRN: [id number] Date of Birth: 13-Feb-1935  Today's Date: 05/19/2020 OT Individual Time: 0845-1000 OT Individual Time Calculation (min): 75 min    Short Term Goals: Week 2:  OT Short Term Goal 1 (Week 2): Pt will complete LB dressing with min assist with use of AE as needed OT Short Term Goal 2 (Week 2): Pt will complete toileting with min assist OT Short Term Goal 3 (Week 2): Pt will complete sit > stand with min assist to progress to clothing management at sit> stand level  Skilled Therapeutic Interventions/Progress Updates:    Treatment session with focus on self-care retraining, functional transfers, sit > stand, and activity tolerance.  Pt received supine in bed declining bathing/dressing but agreeable to changing shirt after encouragement.  Pt completed bed mobility supervision with increased time.  Pt changed shirt and donned clean pullover and button up with supervision.  Pt able to don TLSO with assist to bring around back but then able to fasten without assistance.  Pt completed lateral scoot transfer bed > w/c with CGA.  Pt propelled w/c around unit, on/off elevator for strengthening and endurance.  Engaged in 10 mins on UBE with 5 mins forward and 5 mins backward for strengthening and endurance.  Completed transfers on/off therapy mat with supervision.  Utilized hand skate for RLE on floor with focus on activation of RLE.  Engaged in sit > stand outside parallel bars with focus on upright standing balance.  Pt able to remove alternating UE support to adjust pants in standing with min assist for standing balance.  Engaged in weight shifting side to side in standing to increase WB through RLE.  Pt tolerating increased weight through RLE and prolonged standing without buckling.  Pt propelled back to room and transferred back to bed supervision.  Pt required assist to lift RLE in to bed.    Therapy  Documentation Precautions:  Precautions Precautions: Fall,Back Precaution Comments: Back precautions, TLSO EOB Required Braces or Orthoses: Spinal Brace Spinal Brace: Thoracolumbosacral orthotic,Applied in sitting position Restrictions Weight Bearing Restrictions: No Pain:  Pt with no c/o pain   Therapy/Group: Individual Therapy  Rosalio Loud 05/19/2020, 12:17 PM

## 2020-05-19 NOTE — Progress Notes (Signed)
Physical Therapy Session Note  Patient Details  Name: Timothy Ware MRN: [id number] Date of Birth: 08-22-1934  Today's Date: 05/19/2020 PT Individual Time: 4098-1191 PT Individual Time Calculation (min): 84 min   Short Term Goals: Week 1:  PT Short Term Goal 1 (Week 1): Pt will be able to perform min assist squat pivot transfers bed <> w/c PT Short Term Goal 1 - Progress (Week 1): Met PT Short Term Goal 2 (Week 1): Pt will be able to perform sit <> stand with max assist PT Short Term Goal 2 - Progress (Week 1): Met PT Short Term Goal 3 (Week 1): Pt will be able to tolerate OOB in chair x 2 hours a day PT Short Term Goal 3 - Progress (Week 1): Progressing toward goal Week 2:  PT Short Term Goal 1 (Week 2): pt will perform bed mobility with CGA overall PT Short Term Goal 2 (Week 2): pt will be able to tolerate OOB in chair x 2 hours a day PT Short Term Goal 3 (Week 2): pt will verbalize and demonstrate understanding of back precautions  Skilled Therapeutic Interventions/Progress Updates:  Received pt supine in bed, pt agreeable to therapy, and did not state pain level during session but did report increased low back with mobility (especially bed mobility). Repositioning and rest breaks given to reduce pain levels. Session with emphasis on functional mobility/transfers, generalized strengthening, dynamic standing balance/coordination, NMR, and improved activity tolerance. Pt transferred supine<>sitting EOB with HOB elevated and supervision and donned TLSO with set up assist once therapist placed it around pt's back. Donned shoes with total A and pt transferred bed<>WC via squat<>pivot with CGA and performed WC mobility >527f through [facility] [employer], down elevator, and to 41M therapy gym using BUE and supervision. Pt able to set up transfers throughout session with supervision including WC parts management. Squat<>pivot WC<>mat with CGA and pt transferred sit<>stand with RW and min A x 4 trials. Worked  LLE toe taps to 3in step 4x10 reps using mirror for visual feedback with CGA/min A overall. Therapist guarding R knee and R knee flexed multiple times when stepping with LLE but no true buckling noted. Talked through technique for stand<>pivot and pt transferred mat<>WC stand<>pivot to L with RW and min A x 2 trials with therapist guarding R knee as R knee flexed in stance. Pt unable to lift RLE but able to scoot foot across floor and slide it backwards. Pt reported this is the way he gets in and out of the car with his son at home. Therapist encouraged alternate options including squat<>pivots and slideboard transfers for safety and energy conservation however pt prefers to do it his own way. Pt performed the following exercises sitting on mat: -L hip flexion with 2.5lb ankle weight 2x12 -L LAQ with 2.5lb ankle weight 2x12 -R active assisted towel heel slides 2x10 -pt with decreased R knee flexion/extension ROM Pt transferred sit<>supine on wedge with min A for RLE management and performed the following exercises: -pelvic tilts 2x10 with therapist preventing excessive R hip abduction -limited ROM due to back pain -hooklying hip adduction ball squeezes 2x12 - pt able to maintain hooklying without excessive R hip abduction Pt transferred supine<>L sideling with min A and performed the following exercises: -R active assisted clamshells 2x10 -R active assisted hip extension 2x10 -R active assisted hip flexion 2x10 Pt transferred L sidelying<>supine on wedge<>R sidelying<>sitting with min/mod A with cues for maintaining back precautions. Pt reported increased pain with rolling and bed mobility as  pt reports increased pain wearing TLSO. Squat<>pivot mat<>WC with supervision and raised chest plate on TLSO to top of pt's sternum (unable to raise fully as pt reports it feels like it's "choking" him) and pt transported back to room in [facility] total A. Concluded session with pt sitting in WC, needs within reach, and  chair pad alarm on.   Therapy Documentation Precautions:  Precautions Precautions: Fall,Back Precaution Comments: Back precautions, TLSO EOB Required Braces or Orthoses: Spinal Brace Spinal Brace: Thoracolumbosacral orthotic,Applied in sitting position Restrictions Weight Bearing Restrictions: No  Therapy/Group: Individual Therapy Alfonse Alpers PT, DPT   05/19/2020, 7:28 AM

## 2020-05-19 NOTE — Progress Notes (Signed)
Patient ID: Timothy Ware, male   DOB: 05/24/1934, 86 y.o.   MRN: [id number]  Met with pt to discuss team conference progress toward his goals. He says he feels a little better and is moving his bad leg some. The team tells him he is doing well, pt feels they think he is doing better than he does. Their dog is better and almost back to his normal self. Wife's back is acting up so needs to rest and going to the MD for an injection. Will continue to work on discharge needs and provide support to pt and wife.

## 2020-05-19 NOTE — Progress Notes (Signed)
Pt tolerated  PRAFO boot throughout night. RN will continue to monitor.

## 2020-05-20 NOTE — Progress Notes (Signed)
Patient complaint of abdominal discomfort. Last BM 2/25. Per patient he has been passing gas but not a lot. Maalox given tonight. Reassessed patient after an hour. Discomfort is much better. Will continue to monitor.

## 2020-05-20 NOTE — Progress Notes (Signed)
Occupational Therapy Session Note  Patient Details  Name: Timothy Ware MRN: [id number] Date of Birth: [birth year]  Today's Date: 05/20/2020 OT Individual Time: 1431-1531 OT Individual Time Calculation (min): 60 min   Short Term Goals: Week 2:  OT Short Term Goal 1 (Week 2): Pt will complete LB dressing with min assist with use of AE as needed OT Short Term Goal 2 (Week 2): Pt will complete toileting with min assist OT Short Term Goal 3 (Week 2): Pt will complete sit > stand with min assist to progress to clothing management at sit> stand level  Skilled Therapeutic Interventions/Progress Updates:    Pt greeted in the restroom using the Stedy, pt already wearing TLSO. Pt ultimately unable to void bowels, able to complete perihygiene by reaching under the seat for cleanliness. CGA for sit<stand before returning to the w/c. Pt declined participation in ADLs, wanting to instead work on strengthening his Rt LE in prep for functional stand pivot transfers. OT taught pt how to use both the leg lifter and his gait belt strap to work on active assist ROM for the Rt LE and also methods to strengthen the Rt knee. With instruction and repetitive practice, pt able to complete these exercises independently. Education provided on importance of completing LE exercises as much as possible in the room, when sitting in the chair and also when in bed to promote functional return in his Rt LE. Transitioned to core + UB strengthening via modified w/c push ups. Vcs for equal LE weightbearing to strengthen LB as well, CGA for balance with buttocks off of the w/c. Pt able to use the shoe horn to doff/don his Lt shoe given setup assist. He reported using the shoe horn PTA. Pt remained in the w/c at close of session, all needs within reach and safety belt fastened.   Therapy Documentation Precautions:  Precautions Precautions: Fall,Back Precaution Comments: Back precautions, TLSO EOB Required Braces or Orthoses: Spinal  Brace Spinal Brace: Thoracolumbosacral orthotic,Applied in sitting position Restrictions Weight Bearing Restrictions: No Vital Signs: Therapy Vitals Temp: 98.7 F (37.1 C) Temp Source: Oral Pulse Rate: 97 Resp: 17 BP: 132/65 Patient Position (if appropriate): Lying Pain: Pt reported pain when using the gait belt strap initially for hamstring stretches, pt with no further pain c/o after we modified stretches   ADL: ADL Eating: Independent Grooming: Unable to assess Upper Body Bathing: Unable to assess Lower Body Bathing: Unable to assess Upper Body Dressing: Unable to assess Lower Body Dressing: Unable to assess Toileting: Unable to assess Toilet Transfer: Unable to assess Tub/Shower Transfer: Unable to assess Tub/Shower Transfer Method: Unable to assess      Therapy/Group: Individual Therapy  [name] [name] 05/20/2020, 3:46 PM

## 2020-05-21 MED ORDER — SORBITOL 70 % SOLN
30.0000 mL | Freq: Once | Status: AC
  Administered 2020-05-21: 30 mL via ORAL
  Filled 2020-05-21: qty 30

## 2020-05-21 MED ORDER — BACLOFEN 10 MG PO TABS
10.0000 mg | ORAL_TABLET | Freq: Every day | ORAL | Status: DC
  Administered 2020-05-21 – 2020-05-22 (×2): 10 mg via ORAL
  Filled 2020-05-21 (×2): qty 1

## 2020-05-21 MED ORDER — TAMSULOSIN HCL 0.4 MG PO CAPS
0.4000 mg | ORAL_CAPSULE | Freq: Every day | ORAL | Status: DC
  Administered 2020-05-21 – 2020-05-30 (×10): 0.4 mg via ORAL
  Filled 2020-05-21 (×10): qty 1

## 2020-05-21 NOTE — Progress Notes (Signed)
Pt refused to wear TED hose. RN educated pt. Pt stable at this time and is on xarelto . RN will continue to monitor.

## 2020-05-21 NOTE — Progress Notes (Signed)
Occupational Therapy Session Note  Patient Details  Name: Timothy Ware MRN: [id number] Date of Birth: 02-Nov-1934  Today's Date: 05/21/2020 OT Individual Time: 1829-9371 OT Individual Time Calculation (min): 55 min   Skilled Therapeutic Interventions/Progress Updates:    Pt greeted in bed and premedicated for pain. Supine<sit completed with HOB slightly raised, using the upper bedrail per setup at home. While EOB, pt required vcs to don his TLSO unassisted while adhering to his back precautions. Transitioned to functional stand pivot transfer training using the bariatric Stedy. Pt completed sit<stand from EOB with supervision assist. He required manual cues to increase Rt weightbearing/weight shifting in standing with tactile cues for sustained Rt knee activation. Pt able to stand for 1 minute 50 sec, 32 sec, 46 sec, and 52 sec. Standing endurance limited by Rt arm pain with pt needing prolonged rest breaks in between stands to manage pain. Note that when pt removed the Rt arm from [facility] bar in standing his Rt knee immediately buckled and was caught by the Stedy's knee block. Also tried some standing marches with pts Rt knee immediately buckling onto the Stedy knee block. While seated, pt performed adductor squeezes using pillow 20 reps 2 set. Pt then completed Stedy transfer to the toilet with CGA. Left pt on toilet with pt agreeable to notify staff when he was finished, call bell within reach.   Therapy Documentation Precautions:  Precautions Precautions: Fall,Back Precaution Comments: Back precautions, TLSO EOB Required Braces or Orthoses: Spinal Brace Spinal Brace: Thoracolumbosacral orthotic,Applied in sitting position Restrictions Weight Bearing Restrictions: No ADL: ADL Eating: Independent Grooming: Unable to assess Upper Body Bathing: Unable to assess Lower Body Bathing: Unable to assess Upper Body Dressing: Unable to assess Lower Body Dressing: Unable to assess Toileting: Unable  to assess Toilet Transfer: Unable to assess Tub/Shower Transfer: Unable to assess Tub/Shower Transfer Method: Unable to assess      Therapy/Group: Individual Therapy  Rami Waddle A Delwin Raczkowski 05/21/2020, 12:25 PM

## 2020-05-21 NOTE — Progress Notes (Signed)
PROGRESS NOTE   Subjective/Complaints:  Pt reports poor sleep since up peeing all night- small amounts 1 at a time.  LBM 3-4 days ago- needs to have BM.   Spasms ok if takes baclofen, but has to ask for it.     ROS:  Pt denies SOB, abd pain, CP, N/V/C/D, and vision changes   Objective:   No results found. No results for input(s): WBC, HGB, HCT, PLT in the last 72 hours. No results for input(s): NA, K, CL, CO2, GLUCOSE, BUN, CREATININE, CALCIUM in the last 72 hours.  Intake/Output Summary (Last 24 hours) at 05/21/2020 1546 Last data filed at 05/21/2020 1434 Gross per 24 hour  Intake 795 ml  Output 1125 ml  Net -330 ml        Physical Exam: Vital Signs Blood pressure 134/61, pulse 77, temperature 98.4 F (36.9 C), temperature source Oral, resp. rate 18, height 6\' 1"  (1.854 m), weight 80.5 kg, SpO2 100 %.     General: awake, alert, appropriate, NAD- sitting EOB, ate 100% tray HENT: conjugate gaze; oropharynx moist CV: regular rate; no JVD Pulmonary: CTA B/L; no W/R/R- good air movement GI: soft, NT, ND, (+)BS- hypoactive Psychiatric: appropriate Neurological: Ox3 Spasms/spasticity seen in LEs B/L- mild- moderate Musc: No edema in extremities.  No tenderness in extremities. Neuro: Alert Motor: B/l UE: 5/5 proximal to distal LLE very pleasant: 4+/5 proximal to distal, unchanged RLE: HF and KE 4-/5 (pain component too), ADF 4+/5, unchanged Sensation remains diminished to light touch RLE, prox more than distal  Assessment/Plan: 1. Functional deficits which require 3+ hours per day of interdisciplinary therapy in a comprehensive inpatient rehab setting.  Physiatrist is providing close team supervision and 24 hour management of active medical problems listed below.  Physiatrist and rehab team continue to assess barriers to discharge/monitor patient progress toward functional and medical goals  Care  Tool:  Bathing  Bathing activity did not occur: Refused           Bathing assist       Upper Body Dressing/Undressing Upper body dressing Upper body dressing/undressing activity did not occur (including orthotics): Refused What is the patient wearing?: Pull over shirt,Orthosis,Button up shirt Orthosis activity level: Performed by helper  Upper body assist Assist Level: Minimal Assistance - Patient > 75% (Supervision pull over shirt, Min assist with TLSO)    Lower Body Dressing/Undressing Lower body dressing    Lower body dressing activity did not occur: Refused What is the patient wearing?: Pants     Lower body assist Assist for lower body dressing: Minimal Assistance - Patient > 75%     Toileting Toileting Toileting Activity did not occur (Clothing management and hygiene only): N/A (no void or bm)  Toileting assist Assist for toileting: Dependent - Patient 0% (using Stedy)     Transfers Chair/bed transfer  Transfers assist  Chair/bed transfer activity did not occur: Refused  Chair/bed transfer assist level: Contact Guard/Touching assist     Locomotion Ambulation   Ambulation assist   Ambulation activity did not occur: Safety/medical concerns (pain and unable to stand)  Assist level: Maximal Assistance - Patient 25 - 49% Assistive device: Maxi Sky Max distance:  61ft   Walk 10 feet activity   Assist  Walk 10 feet activity did not occur: Safety/medical concerns        Walk 50 feet activity   Assist Walk 50 feet with 2 turns activity did not occur: Safety/medical concerns         Walk 150 feet activity   Assist Walk 150 feet activity did not occur: Safety/medical concerns         Walk 10 feet on uneven surface  activity   Assist Walk 10 feet on uneven surfaces activity did not occur: Safety/medical concerns         Wheelchair     Assist Will patient use wheelchair at discharge?: Yes Type of Wheelchair: Manual     Wheelchair assist level: Supervision/Verbal cueing Max wheelchair distance: >593ft    Wheelchair 50 feet with 2 turns activity    Assist        Assist Level: Supervision/Verbal cueing   Wheelchair 150 feet activity     Assist      Assist Level: Supervision/Verbal cueing   Medical Problem List and Plan: 1.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 Status post right transpedicular decompression microdiscectomy T10-11 with pedicle screw fixation T10-12 with posterior lateral arthrodesis 05/02/2020. Back brace when out of bed  Continue CIR  PRAFO nightly 2. Antithrombotics: -DVT/anticoagulation: Continue Xarelto  Left femoral and popliteal DVT -antiplatelet therapy: N/A 3. Pain Management:Continue Baclofen 10 mg 3 times daily, hydrocodone as needed. Robaxin as needed  Gabapentin 300 nightly started on 2/17  Controlled with meds on 2/25  2/27- will add Baclofen 10 mg QHS as well as prn  Monitor with increased mobility 4. Mood:Provide emotional support -antipsychotic agents: N/A 5. Neuropsych: This patientiscapable of making decisions on hisown behalf. 6. Skin/Wound Care: Routine skin care  7. Fluids/Electrolytes/Nutrition:Routine in and outs 8. Hypertension.     Tenormin decreased to 12.5mg   2/27- BP controlled- con't Atenolol  Monitor with increased mobility 9. Neurogenic bowel andbladder.   PVRs relatively unremarkable  2/27- pt reports is peeing small amounts during day and night- will start Flomax 0.4 mg nightly  Improving  10. Hyperlipidemia. Lipitor 11. CKD stage III. Creatinine baseline 1.27.   Creatinine 1.16 on 2/21, labs ordered for Monday 12. Nausea:?  Resolved 13.  Sleep disturbance  Melatonin started on 2/16  Improving overall 14.  Acute blood loss anemia  Hemoglobin 9.2 on 2/23, labs ordered for Monday  Hemoccult negative 15. Constipation in  setting of Neurogenic bowel  2/27- give Sorbitol dose today   LOS: 15 days A FACE TO FACE EVALUATION WAS PERFORMED  Fumi Guadron 05/21/2020, 3:46 PM

## 2020-05-22 LAB — CBC WITH DIFFERENTIAL/PLATELET
Abs Immature Granulocytes: 0.03 10*3/uL (ref 0.00–0.07)
Basophils Absolute: 0 10*3/uL (ref 0.0–0.1)
Basophils Relative: 0 %
Eosinophils Absolute: 0 10*3/uL (ref 0.0–0.5)
Eosinophils Relative: 0 %
HCT: 30.8 % — ABNORMAL LOW (ref 39.0–52.0)
Hemoglobin: 9.7 g/dL — ABNORMAL LOW (ref 13.0–17.0)
Immature Granulocytes: 0 %
Lymphocytes Relative: 17 %
Lymphs Abs: 1.6 10*3/uL (ref 0.7–4.0)
MCH: 29.3 pg (ref 26.0–34.0)
MCHC: 31.5 g/dL (ref 30.0–36.0)
MCV: 93.1 fL (ref 80.0–100.0)
Monocytes Absolute: 0.8 10*3/uL (ref 0.1–1.0)
Monocytes Relative: 9 %
Neutro Abs: 6.9 10*3/uL (ref 1.7–7.7)
Neutrophils Relative %: 74 %
Platelets: 294 10*3/uL (ref 150–400)
RBC: 3.31 MIL/uL — ABNORMAL LOW (ref 4.22–5.81)
RDW: 14.2 % (ref 11.5–15.5)
WBC: 9.4 10*3/uL (ref 4.0–10.5)
nRBC: 0 % (ref 0.0–0.2)

## 2020-05-22 LAB — BASIC METABOLIC PANEL
Anion gap: 9 (ref 5–15)
BUN: 17 mg/dL (ref 8–23)
CO2: 24 mmol/L (ref 22–32)
Calcium: 8.5 mg/dL — ABNORMAL LOW (ref 8.9–10.3)
Chloride: 103 mmol/L (ref 98–111)
Creatinine, Ser: 1.24 mg/dL (ref 0.61–1.24)
GFR, Estimated: 58 mL/min — ABNORMAL LOW (ref >60)
Glucose, Bld: 139 mg/dL — ABNORMAL HIGH (ref 70–99)
Potassium: 4.1 mmol/L (ref 3.5–5.1)
Sodium: 136 mmol/L (ref 135–145)

## 2020-05-22 NOTE — Progress Notes (Signed)
Pt is wearing PRAFO boot for the night. Pt refused TEDS but is on xarelto. Pt has been educated.

## 2020-05-22 NOTE — Progress Notes (Signed)
Physical Therapy Session Note  Patient Details  Name: Timothy Ware MRN: [id number] Date of Birth: 1935-02-24  Today's Date: 05/22/2020 PT Individual Time: 1530-1615 PT Individual Time Calculation (min): 45 min   Short Term Goals: Week 3:  PT Short Term Goal 1 (Week 3): STG=LTG due to LOS  Skilled Therapeutic Interventions/Progress Updates:  Pt received seated in w/c in room, agreeable to PT session. No complaints of pain at rest, reports pain in back with mobility that subsides again at rest. Manual w/c propulsion 2 x 250 ft with use of BUE at Supervision level. Pt is able to navigate w/c in/out of elevator safely in order to navigate to rehab gym on [address]. Squat pivot transfer w/c to/from mat table with CGA. Pt is independent for management of w/c parts in order to set w/c up for transfer. Sit to stand x 3 reps from elevated mat table to RW with mod A. Pt requires manual cueing and assist for B knee extension, unable to maintain knees in extended position. Sit to supine min A for RLE management. Supine BLE strengthening therex: AAROM heel slides and hip flex RLE 2 x3 reps to fatigue; SLR LLE x 10 reps; SAQ x 10 reps B. Pt returned to sitting EOM with mod A for RLE management and some trunk control. Pt left seated in w/c in room with needs in reach at end of session.  Therapy Documentation Precautions:  Precautions Precautions: Fall,Back Precaution Comments: Back precautions, TLSO EOB Required Braces or Orthoses: Spinal Brace Spinal Brace: Thoracolumbosacral orthotic,Applied in sitting position Restrictions Weight Bearing Restrictions: No    Therapy/Group: Individual Therapy   Peter Congo, PT, DPT  05/22/2020, 5:11 PM

## 2020-05-22 NOTE — Progress Notes (Signed)
Occupational Therapy Weekly Progress Note  Patient Details  Name: Timothy Ware MRN: [id number] Date of Birth: 1934/09/11  Beginning of progress report period: May 13, 2020 End of progress report period: May 22, 2020  Today's Date: 05/22/2020 OT Individual Time: 2641-5830 and 9407-6808 OT Individual Time Calculation (min): 54 min and 27 min   Patient has met 2 of 3 short term goals.  Pt is making steady progress towards goals.  Pt is able to complete lateral scoot transfers to bed, w/c, and drop arm BSC with CGA to close supervision.  Pt has reported that ADLs, particularly bathing and dressing, are not a focus of his at this time.  Focus has been placed on functional transfers, sit > stand, and dynamic standing balance to increase participation in self-care tasks and functional transfers.  Pt is able to complete LB dressing with min assist at EOB with use of reacher and long handled shoe horn when donning shoes.  Pt is able to complete sit > stand with min assist and is able to stand ~1 min at a time, however continues to have decreased R knee stability and inability to advance RLE during transfers.  Patient continues to demonstrate the following deficits: muscle weakness,decreased cardiorespiratoy enduranceand decreased sitting balance, decreased standing balance, decreased postural control, decreased balance strategies and difficulty maintaining precautions  and therefore will continue to benefit from skilled OT intervention to enhance overall performance with BADL and Reduce care partner burden.  Patient progressing toward long term goals..  Continue plan of care.  OT Short Term Goals Week 2:  OT Short Term Goal 1 (Week 2): Pt will complete LB dressing with min assist with use of AE as needed OT Short Term Goal 1 - Progress (Week 2): Met OT Short Term Goal 2 (Week 2): Pt will complete toileting with min assist OT Short Term Goal 2 - Progress (Week 2): Progressing toward goal OT  Short Term Goal 3 (Week 2): Pt will complete sit > stand with min assist to progress to clothing management at sit> stand level OT Short Term Goal 3 - Progress (Week 2): Met Week 3:  OT Short Term Goal 1 (Week 3): STG = LTGs due to remaining LOS  Skilled Therapeutic Interventions/Progress Updates:    1) Treatment session with focus on functional mobility, sit > stand, and activity tolerance.  Pt received upright in w/c agreeable to therapy session.  Pt propelled w/c >500' over various surfaces and on/off elevator for BUE strengthening and endurance.  Pt engaged in BLE strengthening and endurance on Kinetron with focus on strengthening and endurance to progress to standing for LB self-care tasks.  Pt able to complete on 40 cm/sec progressing to 30 cm/sec for ~3 mins each before requiring rest break.  Noted pt with increased RLE, especially knee control while using Kinetron.  Pt completed lateral scoot transfer w/c <> therapy mat with supervision.  Attempted sit > stand from elevated mat.  Pt able to stand x1 with mod assist but unable to achieve full upright standing due to reports of BUE and BLE weakness.  On 2nd attempt, pt able to Ware fully upright with a lot of pressure on RW and only able to stand ~20 seconds.  Pt reports weakness since therapy yesterday.  Pt transported back to room and completed lateral scoot transfer CGA back to bed.  Pt doffed shoes and TLSO with supervision.  Returned to supine and left in supine with all needs in reach.  2) Treatment session with  focus on sit > stand and dynamic standing balance for self-care tasks.  Pt received supine in bed reporting able to rest between sessions.  Pt excited to show therapist that he can lift RLE off bed, able to complete partial heel slide and even lift foot from bed while in supine without compensatory movements/strategies.  Pt completed bed mobility supervision and donned TLSO with setup to place behind back.  Engaged in sit> stand in [place]  with focus on increased upright standing tolerance and progressing to decreased reliance on UE in standing.  Pt able to engage in simulated LB dressing by alternating UE support in standing to reach behind self.  Pt then also able to maintain standing without any UE support for ~20 seconds while standing in [place] without reliance on shin aspect of Stedy.  Therapist challenging pt to progress to standing for clothing management, with pt hesitant but stating "that would be nice".  Attempted sit > stand for stand pivot transfer with pt able to stand with mod assist but unable to maintain upright standing to attempt stand pivot this session.  Completed lateral scoot transfer with supervision.  Pt remained upright in w/c with all needs in reach.  Therapy Documentation Precautions:  Precautions Precautions: Fall,Back Precaution Comments: Back precautions, TLSO EOB Required Braces or Orthoses: Spinal Brace Spinal Brace: Thoracolumbosacral orthotic,Applied in sitting position Restrictions Weight Bearing Restrictions: No General:   Vital Signs: Therapy Vitals Temp: 98.8 F (37.1 C) Temp Source: Oral Pulse Rate: 77 Resp: 16 BP: (!) 107/57 Patient Position (if appropriate): Lying Oxygen Therapy SpO2: 93 % O2 Device: Room Air Pain: Pain Assessment Pain Scale: 0-10 Pain Score: 0-No pain   Therapy/Group: Individual Therapy  Timothy Ware 05/22/2020, 7:48 AM

## 2020-05-22 NOTE — Progress Notes (Signed)
Physical Therapy Weekly Progress Note  Patient Details  Name: Timothy Ware MRN: [id number] Date of Birth: 04/12/1934  Beginning of progress report period: May 07, 2020 End of progress report period: May 22, 2020  Today's Date: 05/22/2020 PT Individual Time: 5726-2035 PT Individual Time Calculation (min): 54 min   Patient has met 2 of 3 short term goals. Pt demonstrates steady progress towards long term goals. Pt is currently able to transfer supine<>sitting EOB with supervision but requires min A for sit<>supine for RLE management due to weakness/paralysis. Pt able to perform sit<>stand and stand<>pivot transfers with RW and min A but is unable to advance RLE. [facility] to advance RLE but will not be a functional ambulator upon D/C. Pt continues to be limited by low back pain and RLE weakness/paralysis.   Patient continues to demonstrate the following deficits muscle weakness and muscle paralysis, decreased cardiorespiratoy endurance, unbalanced muscle activation and decreased coordination and decreased standing balance, decreased postural control, decreased balance strategies and difficulty maintaining precautions and therefore will continue to benefit from skilled PT intervention to increase functional independence with mobility.  Patient progressing toward long term goals..  Continue plan of care.  PT Short Term Goals Week 2:  PT Short Term Goal 1 (Week 2): pt will perform bed mobility with CGA overall PT Short Term Goal 1 - Progress (Week 2): Progressing toward goal PT Short Term Goal 2 (Week 2): pt will be able to tolerate OOB in chair x 2 hours a day PT Short Term Goal 2 - Progress (Week 2): Met PT Short Term Goal 3 (Week 2): pt will verbalize and demonstrate understanding of back precautions PT Short Term Goal 3 - Progress (Week 2): Met Week 3:  PT Short Term Goal 1 (Week 3): STG=LTG due to LOS  Skilled Therapeutic  Interventions/Progress Updates:  Ambulation/gait training;Balance/vestibular training;Community reintegration;Discharge planning;Disease management/prevention;DME/adaptive equipment instruction;Functional mobility training;Neuromuscular re-education;Pain management;Patient/family education;Psychosocial support;Skin care/wound management;Functional electrical stimulation;Splinting/orthotics;Stair training;Therapeutic Activities;Therapeutic Exercise;UE/LE Strength taining/ROM;UE/LE Coordination activities;Wheelchair propulsion/positioning   Today's Interventions: Received pt supine in bed, pt agreeable to therapy, and reported pain in low back with mobility but reported pain decreased with rest breaks. RN present to administer medications and therapist raised chest plate on TLSO for improved support. Session with emphasis on functional mobility/transfers, generalized strengthening, dynamic standing balance/coordination, NMR, and improved activity tolerance. Pt transferred supine<>sitting EOB with supervision and donned TLSO sitting EOB with set up assist and increased time and shoes with total A. Squat<>pivot bed<>WC with close supervision and pt performed WC mobility >5070fusing BUE and [facility], and to 34M therapy gym. MD present for morning rounds. Pt transferred sit<>stand with RW and min A with therapist guarding R knee x 3 trials and worked on dynamic standing balance, R lateral weight shifting and knee control, and fine motor control playing connect four x 3 trials standing for 1 minute each trial. Pt with no R knee buckling but therapist closely guarding R knee, providing min A, and providing mod verbal cues for R knee extension. Pt required multiple attempts to stand today and noted pt with increased fatigue reporting feeling "worn out" from therapy yesterday. Pt transported back to room on 5C in [place] A. Concluded session with pt sitting in WC, needs within reach,  and chair pad alarm on awaiting OT session.   Therapy Documentation Precautions:  Precautions Precautions: Fall,Back Precaution Comments: Back precautions, TLSO EOB Required Braces or Orthoses:  Spinal Brace Spinal Brace: Thoracolumbosacral orthotic,Applied in sitting position Restrictions Weight Bearing Restrictions: No  Therapy/Group: Individual Therapy Alfonse Alpers PT, DPT   05/22/2020, 7:21 AM

## 2020-05-22 NOTE — Progress Notes (Signed)
PROGRESS NOTE   Subjective/Complaints: Patient seen sitting up in his chair working with therapies.  He states he slept fairly overnight.  He would like to confirm his discharge date.  He notes he had a bowel movement.  ROS: Denies CP, SOB, N/V/D  Objective:   No results found. Recent Labs    05/22/20 1116  WBC 9.4  HGB 9.7*  HCT 30.8*  PLT 294   Recent Labs    05/22/20 1116  NA 136  K 4.1  CL 103  CO2 24  GLUCOSE 139*  BUN 17  CREATININE 1.24  CALCIUM 8.5*    Intake/Output Summary (Last 24 hours) at 05/22/2020 1315 Last data filed at 05/22/2020 0759 Gross per 24 hour  Intake 600 ml  Output 1275 ml  Net -675 ml        Physical Exam: Vital Signs Blood pressure (!) 107/57, pulse 77, temperature 98.8 F (37.1 C), temperature source Oral, resp. rate 16, height 6\' 1"  (1.854 m), weight 80.5 kg, SpO2 93 %.  Constitutional: No distress . Vital signs reviewed. HENT: Normocephalic.  Atraumatic. Eyes: EOMI. No discharge. Cardiovascular: No JVD.  RRR. Respiratory: Normal effort.  No stridor.  Bilateral clear to auscultation. GI: Non-distended.  BS +. Skin: Warm and dry.  Intact. Psych: Normal mood.  Normal behavior. Musc: No edema in extremities.  No tenderness in extremities. Neuro: Alert Motor: B/l UE: 5/5 proximal to distal LLE: 4+/5 proximal to distal, unchanged RLE: HF and KE 4-/5 (pain component too), ADF 4+/5, stable Sensation remains diminished to light touch RLE, prox more than distal  Assessment/Plan: 1. Functional deficits which require 3+ hours per day of interdisciplinary therapy in a comprehensive inpatient rehab setting.  Physiatrist is providing close team supervision and 24 hour management of active medical problems listed below.  Physiatrist and rehab team continue to assess barriers to discharge/monitor patient progress toward functional and medical goals  Care Tool:  Bathing  Bathing  activity did not occur: Refused           Bathing assist       Upper Body Dressing/Undressing Upper body dressing Upper body dressing/undressing activity did not occur (including orthotics): Refused What is the patient wearing?: Pull over shirt,Orthosis,Button up shirt Orthosis activity level: Performed by helper  Upper body assist Assist Level: Minimal Assistance - Patient > 75% (Supervision pull over shirt, Min assist with TLSO)    Lower Body Dressing/Undressing Lower body dressing    Lower body dressing activity did not occur: Refused What is the patient wearing?: Pants     Lower body assist Assist for lower body dressing: Minimal Assistance - Patient > 75%     Toileting Toileting Toileting Activity did not occur (Clothing management and hygiene only): N/A (no void or bm)  Toileting assist Assist for toileting: Dependent - Patient 0% (using Stedy)     Transfers Chair/bed transfer  Transfers assist  Chair/bed transfer activity did not occur: Refused  Chair/bed transfer assist level: Contact Guard/Touching assist     Locomotion Ambulation   Ambulation assist   Ambulation activity did not occur: Safety/medical concerns (pain and unable to stand)  Assist level: Maximal Assistance - Patient 25 -  49% Assistive device: Maxi Sky Max distance: 94ft   Walk 10 feet activity   Assist  Walk 10 feet activity did not occur: Safety/medical concerns        Walk 50 feet activity   Assist Walk 50 feet with 2 turns activity did not occur: Safety/medical concerns         Walk 150 feet activity   Assist Walk 150 feet activity did not occur: Safety/medical concerns         Walk 10 feet on uneven surface  activity   Assist Walk 10 feet on uneven surfaces activity did not occur: Safety/medical concerns         Wheelchair     Assist Will patient use wheelchair at discharge?: Yes Type of Wheelchair: Manual    Wheelchair assist level:  Supervision/Verbal cueing Max wheelchair distance: >547ft    Wheelchair 50 feet with 2 turns activity    Assist        Assist Level: Supervision/Verbal cueing   Wheelchair 150 feet activity     Assist      Assist Level: Supervision/Verbal cueing   Medical Problem List and Plan: 1.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 Status post right transpedicular decompression microdiscectomy T10-11 with pedicle screw fixation T10-12 with posterior lateral arthrodesis 05/02/2020. Back brace when out of bed  Continue CIR  PRAFO nightly 2. Antithrombotics: -DVT/anticoagulation: Continue Xarelto  Left femoral and popliteal DVT -antiplatelet therapy: N/A 3. Pain Management:Continue Baclofen 10 mg 3 times daily, hydrocodone as needed. Robaxin as needed  Gabapentin 300 nightly started on 2/17  Controlled with meds on 2/25  Baclofen 10 mg QHS as well as prn  Controlled with meds on 2/28  Monitor with increased mobility 4. Mood:Provide emotional support -antipsychotic agents: N/A 5. Neuropsych: This patientiscapable of making decisions on hisown behalf. 6. Skin/Wound Care: Routine skin care  7. Fluids/Electrolytes/Nutrition:Routine in and outs 8. Hypertension.     Tenormin decreased to 12.5mg   Controlled on 2/28  Monitor with increased mobility 9. Neurogenic bowel andbladder.   PVRs relatively unremarkable  Flomax 0.4 mg nightly  Bowel movements improving 10. Hyperlipidemia. Lipitor 11. CKD stage III. Creatinine baseline 1.27.   Creatinine 1.24 on 2/28  12. Nausea:?  Resolved 13.  Sleep disturbance  Melatonin started on 2/16  Improving overall 14.  Acute blood loss anemia  Hemoglobin 9.7 on 2/28  Hemoccult negative   LOS: 16 days A FACE TO FACE EVALUATION WAS PERFORMED  Timothy Ware 05/22/2020, 1:15 PM

## 2020-05-23 DIAGNOSIS — I10 Essential (primary) hypertension: Secondary | ICD-10-CM

## 2020-05-23 MED ORDER — TRAZODONE HCL 50 MG PO TABS
25.0000 mg | ORAL_TABLET | Freq: Every day | ORAL | Status: DC
  Administered 2020-05-23: 25 mg via ORAL
  Filled 2020-05-23: qty 1

## 2020-05-23 MED ORDER — BACLOFEN 10 MG PO TABS
20.0000 mg | ORAL_TABLET | Freq: Every day | ORAL | Status: DC
  Administered 2020-05-23: 20 mg via ORAL
  Filled 2020-05-23: qty 2

## 2020-05-23 MED ORDER — POLYETHYLENE GLYCOL 3350 17 G PO PACK
17.0000 g | PACK | Freq: Two times a day (BID) | ORAL | Status: DC
  Administered 2020-05-23 – 2020-05-30 (×11): 17 g via ORAL
  Filled 2020-05-23 (×11): qty 1

## 2020-05-23 NOTE — Progress Notes (Signed)
Physical Therapy missed visit (60 min) Note  Patient Details  Name: Timothy Ware MRN: [id number] Date of Birth: 01/08/1935 Today's Date: 05/23/2020    Pt declined treatment this pm stating he was completely exhausted and unable to partiicipate.  Rada Hay, PT    [name] 05/23/2020, 2:23 PM

## 2020-05-23 NOTE — Progress Notes (Signed)
PROGRESS NOTE   Subjective/Complaints: Patient seen laying in bed this AM.  He states he slept fairly overnight due to "jumping legs".  He notes constipation.    ROS: +Constipation. Denies CP, SOB, N/V/D  Objective:   No results found. Recent Labs    05/22/20 1116  WBC 9.4  HGB 9.7*  HCT 30.8*  PLT 294   Recent Labs    05/22/20 1116  NA 136  K 4.1  CL 103  CO2 24  GLUCOSE 139*  BUN 17  CREATININE 1.24  CALCIUM 8.5*    Intake/Output Summary (Last 24 hours) at 05/23/2020 1633 Last data filed at 05/23/2020 1530 Gross per 24 hour  Intake 476 ml  Output 1725 ml  Net -1249 ml        Physical Exam: Vital Signs Blood pressure (!) 99/53, pulse 92, temperature 98.4 F (36.9 C), temperature source Oral, resp. rate 16, height 6\' 1"  (1.854 m), weight 80.5 kg, SpO2 98 %.  Constitutional: No distress . Vital signs reviewed. HENT: Normocephalic.  Atraumatic. Eyes: EOMI. No discharge. Cardiovascular: No JVD.  RRR. Respiratory: Normal effort.  No stridor.  Bilateral clear to auscultation. GI: Non-distended.  BS hypoactive.  Skin: Warm and dry.  Intact. Psych: Normal mood.  Normal behavior. Musc: No edema in extremities.  No tenderness in extremities. Neuro: Alert Motor: B/l UE: 5/5 proximal to distal LLE: 4+/5 proximal to distal, stable RLE: HF and KE 4-/5 (pain component too), ADF 4+/5, unchanged Sensation remains diminished to light touch RLE, prox more than distal  Assessment/Plan: 1. Functional deficits which require 3+ hours per day of interdisciplinary therapy in a comprehensive inpatient rehab setting.  Physiatrist is providing close team supervision and 24 hour management of active medical problems listed below.  Physiatrist and rehab team continue to assess barriers to discharge/monitor patient progress toward functional and medical goals  Care Tool:  Bathing  Bathing activity did not occur: Refused            Bathing assist       Upper Body Dressing/Undressing Upper body dressing Upper body dressing/undressing activity did not occur (including orthotics): Refused What is the patient wearing?: Pull over shirt,Orthosis,Button up shirt Orthosis activity level: Performed by helper  Upper body assist Assist Level: Minimal Assistance - Patient > 75% (Supervision pull over shirt, Min assist with TLSO)    Lower Body Dressing/Undressing Lower body dressing    Lower body dressing activity did not occur: Refused What is the patient wearing?: Pants     Lower body assist Assist for lower body dressing: Minimal Assistance - Patient > 75%     Toileting Toileting Toileting Activity did not occur (Clothing management and hygiene only): N/A (no void or bm)  Toileting assist Assist for toileting: Dependent - Patient 0% (using Stedy)     Transfers Chair/bed transfer  Transfers assist  Chair/bed transfer activity did not occur: Refused  Chair/bed transfer assist level: Contact Guard/Touching assist     Locomotion Ambulation   Ambulation assist   Ambulation activity did not occur: Safety/medical concerns (pain and unable to stand)  Assist level: Maximal Assistance - Patient 25 - 49% Assistive device: Maxi Sky Max distance:  82ft   Walk 10 feet activity   Assist  Walk 10 feet activity did not occur: Safety/medical concerns        Walk 50 feet activity   Assist Walk 50 feet with 2 turns activity did not occur: Safety/medical concerns         Walk 150 feet activity   Assist Walk 150 feet activity did not occur: Safety/medical concerns         Walk 10 feet on uneven surface  activity   Assist Walk 10 feet on uneven surfaces activity did not occur: Safety/medical concerns         Wheelchair     Assist Will patient use wheelchair at discharge?: Yes Type of Wheelchair: Manual    Wheelchair assist level: Supervision/Verbal cueing Max wheelchair  distance: >561ft    Wheelchair 50 feet with 2 turns activity    Assist        Assist Level: Supervision/Verbal cueing   Wheelchair 150 feet activity     Assist      Assist Level: Supervision/Verbal cueing   Medical Problem List and Plan: 1.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 Status post right transpedicular decompression microdiscectomy T10-11 with pedicle screw fixation T10-12 with posterior lateral arthrodesis 05/02/2020. Back brace when out of bed  Continue CIR  PRAFO nightly 2. Antithrombotics: -DVT/anticoagulation: Continue Xarelto  Left femoral and popliteal DVT -antiplatelet therapy: N/A 3. Pain Management:Continue Baclofen 10 mg 3 times daily, hydrocodone as needed. Robaxin as needed  Gabapentin 300 nightly started on 2/17  Controlled with meds on 2/25  Baclofen 10 mg QHS, increased to 20 on 3/1 as well as prn  Monitor with increased mobility 4. Mood:Provide emotional support -antipsychotic agents: N/A 5. Neuropsych: This patientiscapable of making decisions on hisown behalf. 6. Skin/Wound Care: Routine skin care  7. Fluids/Electrolytes/Nutrition:Routine in and outs 8. Hypertension.     Tenormin decreased to 12.5mg   Controlled on 3/1  Monitor with increased mobility 9. Neurogenic bowel andbladder.   PVRs relatively unremarkable  Flomax 0.4 mg nightly  Bowel meds increased on 3/1 10. Hyperlipidemia. Lipitor 11. CKD stage III. Creatinine baseline 1.27.   Creatinine 1.24 on 2/28 12. Nausea:?  Resolved 13.  Sleep disturbance  Melatonin started on 2/16  ECG reviewed, Trazodone 25qhs started on 3/1  Improving overall 14.  Acute blood loss anemia  Hemoglobin 9.7 on 2/28  Hemoccult negative   LOS: 17 days A FACE TO FACE EVALUATION WAS PERFORMED  Balbina Depace [name] 05/23/2020, 4:33 PM

## 2020-05-23 NOTE — Progress Notes (Signed)
Occupational Therapy Session Note  Patient Details  Name: Timothy Ware MRN: [id number] Date of Birth: 02-16-1935  Today's Date: 05/23/2020 OT Individual Time: 3009-2330 OT Individual Time Calculation (min): 60 min    Short Term Goals: Week 3:  OT Short Term Goal 1 (Week 3): STG = LTGs due to remaining LOS  Skilled Therapeutic Interventions/Progress Updates:    Treatment session with focus on functional transfers, sit > stand, and dynamic standing balance.  Pt received supine in bed reporting fatigue and not sleeping well overnight.  Pt reports feeling like he is "in a fog" and has felt that way for a few days.  Pt donned TLSO with setup assist to place behind back.  Pt completed lateral scoot transfer bed > w/c CGA.  Pt propelled w/c to therapy gym for UB strengthening and endurance.  Pt completed lateral scoot transfers w/c <> therapy mat with supervision and pt able to appropriately set up w/c.  Engaged in sit > stand with RW with focus on weight shift and upright standing tolerance.  Increased challenge to engaging in reaching activity while standing to progress to alternating UE support to simulate clothing management in standing.  Pt requiring guarding at RLE for instability and min assist for sit > stand.  Pt able to remove BUE from RW for ~15 seconds while therapist remained guarding RLE.  On 3rd stand, pt required mod assist due to fatigue.  Pt returned to room propelling w/c.  Pt reports weakness in BUE, especially RUE.  Engaged in 2 sets of 15 with 1kg medicine ball and then wall slides with LUE to address L shoulder weakness.  Pt remained upright in w/c with all needs in reach.  Therapy Documentation Precautions:  Precautions Precautions: Fall,Back Precaution Comments: Back precautions, TLSO EOB Required Braces or Orthoses: Spinal Brace Spinal Brace: Thoracolumbosacral orthotic,Applied in sitting position Restrictions Weight Bearing Restrictions: No General:   Vital  Signs:  Pain: Pain Assessment Pain Scale: 0-10 Pain Score: 1  Pain Type: Acute pain Pain Location: Leg Pain Orientation: Right;Left Pain Descriptors / Indicators: Spasm Pain Onset: On-going Pain Intervention(s): Medication (See eMAR) ADL: ADL Eating: Independent Grooming: Unable to assess Upper Body Bathing: Unable to assess Lower Body Bathing: Unable to assess Upper Body Dressing: Unable to assess Lower Body Dressing: Unable to assess Toileting: Unable to assess Toilet Transfer: Unable to assess Tub/Shower Transfer: Unable to assess Tub/Shower Transfer Method: Unable to assess Vision   Perception    Praxis   Exercises:   Other Treatments:     Therapy/Group: Individual Therapy  Rosalio Loud 05/23/2020, 9:50 AM

## 2020-05-23 NOTE — Plan of Care (Signed)
  Problem: Consults Goal: RH SPINAL CORD INJURY PATIENT EDUCATION Description:  See Patient Education module for education specifics.  Outcome: Progressing   Problem: RH SKIN INTEGRITY Goal: RH STG SKIN FREE OF INFECTION/BREAKDOWN Description: Assess skin q shift with min assist Outcome: Progressing Goal: RH STG ABLE TO PERFORM INCISION/WOUND CARE W/ASSISTANCE Description: STG Able To Perform Incision/Wound Care With Mod I Assistance. Outcome: Progressing   Problem: RH SAFETY Goal: RH STG ADHERE TO SAFETY PRECAUTIONS W/ASSISTANCE/DEVICE Description: STG Adhere to Safety Precautions With cues /reminder assistance/Device. Outcome: Progressing Goal: RH STG DECREASED RISK OF FALL WITH ASSISTANCE Description: STG Decreased Risk of Fall With cues/reminders Assistance. Outcome: Progressing   Problem: RH PAIN MANAGEMENT Goal: RH STG PAIN MANAGED AT OR BELOW PT'S PAIN GOAL Description: <2 Outcome: Progressing   Problem: RH KNOWLEDGE DEFICIT SCI Goal: RH STG INCREASE KNOWLEDGE OF SELF CARE AFTER SCI Description: Patient will be able to complete self care and direct others for care assistance needs independently at discharge using handouts and educational materials. Outcome: Progressing   

## 2020-05-23 NOTE — Progress Notes (Signed)
Physical Therapy Session Note  Patient Details  Name: Timothy Ware MRN: [id number] Date of Birth: 03/07/1935  Today's Date: 05/23/2020 PT Individual Time: 0959-1018 PT Individual Time Calculation (min): 19 min   Today's Date: 05/23/2020 PT Missed Time: 56 Minutes Missed Time Reason: Patient fatigue  Short Term Goals: Week 2:  PT Short Term Goal 1 (Week 2): pt will perform bed mobility with CGA overall PT Short Term Goal 1 - Progress (Week 2): Progressing toward goal PT Short Term Goal 2 (Week 2): pt will be able to tolerate OOB in chair x 2 hours a day PT Short Term Goal 2 - Progress (Week 2): Met PT Short Term Goal 3 (Week 2): pt will verbalize and demonstrate understanding of back precautions PT Short Term Goal 3 - Progress (Week 2): Met Week 3:  PT Short Term Goal 1 (Week 3): STG=LTG due to LOS  Skilled Therapeutic Interventions/Progress Updates:   Received pt sitting in WC falling asleep, pt reported feeling exhausted and weak stating "I can barely sit up in this wheelchair". Pt reported not sleeping well last night due to his legs "jerking around" and c/o having no energy. Pt stated "I want to go to the gym but I just dont think I can do it". Therapist notified RN and MD and assisted pt back to bed. Pt transferred WC<>bed via multiple lateral scoots with CGA. Pt required rest break prior to lying down. Doffed shoes with total A and TLSO with min A. Pt transferred sit<>supine with min A for RLE management. BP in supine: 120/61 and HR 70bpm. Concluded session with pt supine in bed, needs within reach, and bed alarm on. Therapist provided fresh ice water for pt. 56 minutes missed of skilled physical therapy due to fatigue.   Therapy Documentation Precautions:  Precautions Precautions: Fall,Back Precaution Comments: Back precautions, TLSO EOB Required Braces or Orthoses: Spinal Brace Spinal Brace: Thoracolumbosacral orthotic,Applied in sitting position Restrictions Weight Bearing  Restrictions: No  Therapy/Group: Individual Therapy Anna M Johnson  Anna Johnson PT, DPT   05/23/2020, 7:26 AM  

## 2020-05-24 MED ORDER — MAGNESIUM CITRATE PO SOLN
1.0000 | Freq: Every day | ORAL | Status: DC | PRN
  Administered 2020-05-25: 1 via ORAL
  Filled 2020-05-24: qty 296

## 2020-05-24 MED ORDER — BACLOFEN 10 MG PO TABS
10.0000 mg | ORAL_TABLET | Freq: Every day | ORAL | Status: DC
Start: 2020-05-24 — End: 2020-05-26
  Administered 2020-05-24 – 2020-05-25 (×2): 10 mg via ORAL
  Filled 2020-05-24 (×2): qty 1

## 2020-05-24 NOTE — Progress Notes (Signed)
RN notified Pam, PA of strong fishy smelling urine and frequent urination (pt is on flomax) Pt denied painful urination. No temp noted. PA ordered RN to increase hydration. RN put new urinal in roomPt stable at this time. RN will continue to monitor.

## 2020-05-24 NOTE — Patient Care Conference (Signed)
Inpatient RehabilitationTeam Conference and Plan of Care Update Date: 05/24/2020   Time: 11:43 AM    Patient Name: Timothy Ware      Medical Record Number: [id number]  Date of Birth: 11/30/1934 Sex: Male         Room/Bed: 5C04C/5C04C-01 Payor Info: Payor: MEDICARE / Plan: MEDICARE PART A AND B / Product Type: *No Product type* /    Admit Date/Time:  05/06/2020  2:37 PM  Primary Diagnosis:  Thoracic myelopathy  Hospital Problems: Principal Problem:   Thoracic myelopathy Active Problems:   Stage 3a chronic kidney disease (HCC)   Neurogenic bowel   Acute deep vein thrombosis (DVT) of femoral vein of left lower extremity (HCC)   Sleep disturbance   Benign essential HTN    Expected Discharge Date: Expected Discharge Date: 05/31/20  Team Members Present: Physician leading conference: Dr. Sula Soda Care Coodinator Present: Dossie Der, LCSW;Ruari Duggan Lambert Mody, RN, BSN, CRRN Nurse Present: Willey Blade, RN PT Present: Sheran Lawless, PT OT Present: Towanda Malkin, OT PPS Coordinator present : Fae Pippin, [place], PT     Current Status/Progress Goal Weekly Team Focus  Bowel/Bladder   Pt continent of B/B with one incontinent episode this morning. LBM 2/27- senokoot and sorbitol given  Pt will remain continent  Q2 toileting and continue bowel regimen with PRN laxatives   Swallow/Nutrition/ Hydration             ADL's   CGA bed mobility using AE for RLE management, CGA to close supervision lateral scoot transfers, min assist sit > stand, min assist LB dressing at bed level/EOB, Supervisin UB, min-mod assist toileting  Min A toileting, CGA transfers and bathing/dressing  ADL retraining, transfers, sit > stand, dynamic standing balance, activity tolerance/endurance, pain management, AE education for LB dressing   Mobility   bed mobility min A for RLE management, lateral scoot/squat<>pivot transfers CGA, sit<>stand min A with RW, WC mobility >573ft supervision  mod I bed  mobility, supervision transfers, min A standing, mod I WC mobility  functional mobility/transfers, generalized strengthening, dynamic standing balance/coordination, education, endurance, D/C planning   Communication             Safety/Cognition/ Behavioral Observations            Pain   Spasms in right leg controlled with baclofen and robaxin  Pt will continue current PRN medication regimen for spams in leg  Assess pain/spasms and give PRN medications   Skin   Surgical incision on back healing withno s/sx of infection  Pt will not develop any new skin breakdown  Assess skin per shift     Discharge Planning:  Pt has made slow progress and will have 24/7 at discharge via family members. Pt went home wheelchair level when here in 02/2020 Home health in place   Team Discussion: Functional fluctuation issues noted this week, more weakness and fatigue.  Patient on target to meet rehab goals: yes, on track for discharge CGA for transfers and supervision for wheelchair mobility and Independent for bed mobility  *See Care Plan and progress notes for long and short-term goals.   Revisions to Treatment Plan:   Teaching Needs: Transfers, toileting, medications, etc.   Current Barriers to Discharge: W/C level at discharge previously  Possible Resolutions to Barriers: Family education     Medical Summary Current Status: Woke up soiled in urine today and was upset about this, received Baclofen (increased) and Trazodone (new) last night as has been having insomnia  Barriers to Discharge:  Medical stability  Barriers to Discharge Comments: Woke up soiled in urine today and was upset about this, received Baclofen (increased) and Trazodone (new) last night as has been having insomnia, constipation, hypotension Possible Resolutions to [employer] Focus: Decreased Baclofen to 10mg , discontinue Trazodone, add mag citrate for constipation, discontinue Tenormin   Continued Need for Acute  Rehabilitation Level of Care: The patient requires daily medical management by a physician with specialized training in physical medicine and rehabilitation for the following reasons: Direction of a multidisciplinary physical rehabilitation program to maximize functional independence : Yes Medical management of patient stability for increased activity during participation in an intensive rehabilitation regime.: Yes Analysis of laboratory values and/or radiology reports with any subsequent need for medication adjustment and/or medical intervention. : Yes   I attest that I was present, lead the team conference, and concur with the assessment and plan of the team.   B 05/24/2020, 4:05 PM

## 2020-05-24 NOTE — Progress Notes (Signed)
Occupational Therapy Session Note  Patient Details  Name: Timothy Ware MRN: [id number] Date of Birth: Aug 08, 1934  Today's Date: 05/24/2020 OT Individual Time: 1034-1100 and 1305-1400 OT Individual Time Calculation (min): 26 min and 55 min   Short Term Goals: Week 3:  OT Short Term Goal 1 (Week 3): STG = LTGs due to remaining LOS  Skilled Therapeutic Interventions/Progress Updates:    1) Treatment session with focus on d/c planning and medication.  Pt received supine in bed reporting fatigue and "out of it".  Pt reports sleeping "like a log" overnight and when awaking being soaked (in urine).  Pt with concerns due to changes in medicine.  Discussed possible circumstances and recommended pt speak to MD about his questions/concerns.  MD arrived during session and pt discussed with MD.  Discussed home setup and progress towards goals.  Pt requested to remain in bed for this session but willing to attempt getting up during PM session.  Pt completed heel slides at bed level with increased hip and knee flexion even lifting foot from bed.  Pt remained supine in bed with all needs in reach.  2) Treatment session with focus on functional transfers and sit > stand.  Pt received supine in bed with daughter present.  Pt agreeable to therapy session.  Pt completed bed mobility with supervision and use of bed rails.  Pt donned TLSO with setup from therapist to place behind back, pt then able to complete all fasteners.  Pt completed lateral scoot transfer bed > w/c with supervision.  Pt propelled w/c to therapy gym for BUE strengthening and endurance.  Engaged in sit> stand x4 with pt able to complete with min assist.  Therapist providing guarding with intermittent support at knee for increased knee extension and upright standing.  Engaged in reaching with focus on alternating UE support as needed for clothing management in standing.  Min assist to CGA for standing balance with and without UE support.  Pt returned to  room and remained upright in w/c with all needs in reach.  Therapy Documentation Precautions:  Precautions Precautions: Fall,Back Precaution Comments: Back precautions, TLSO EOB Required Braces or Orthoses: Spinal Brace Spinal Brace: Thoracolumbosacral orthotic,Applied in sitting position Restrictions Weight Bearing Restrictions: No General:   Vital Signs: Therapy Vitals Temp: (!) 97.5 F (36.4 C) Temp Source: Oral Pulse Rate: 75 Resp: 16 BP: 105/64 Patient Position (if appropriate): Sitting Oxygen Therapy SpO2: 97 % O2 Device: Room Air Pain: Pain Assessment Pain Scale: 0-10 Pain Score: 0-No pain   Therapy/Group: Individual Therapy  Rosalio Loud 05/24/2020, 3:33 PM

## 2020-05-24 NOTE — Progress Notes (Signed)
Physical Therapy Session Note  Patient Details  Name: Timothy Ware MRN: [id number] Date of Birth: 1934-09-10  Today's Date: 05/24/2020 PT Individual Time: 1445-1525 PT Individual Time Calculation (min): 40 min   Short Term Goals: Week 2:  PT Short Term Goal 1 (Week 2): pt will perform bed mobility with CGA overall PT Short Term Goal 1 - Progress (Week 2): Progressing toward goal PT Short Term Goal 2 (Week 2): pt will be able to tolerate OOB in chair x 2 hours a day PT Short Term Goal 2 - Progress (Week 2): Met PT Short Term Goal 3 (Week 2): pt will verbalize and demonstrate understanding of back precautions PT Short Term Goal 3 - Progress (Week 2): Met Week 3:  PT Short Term Goal 1 (Week 3): STG=LTG due to LOS  Skilled Therapeutic Interventions/Progress Updates:   Received pt sitting in WC, pt agreeable to therapy, and reported low back pain 1/10 and fatigue 8/10 at start of session. Session with emphasis on functional mobility/transfers, generalized strengthening, dynamic standing balance/coordination, NMR, and improved activity tolerance. Pt transported to 4W dayroom in [facility] total A due to fatigue and able to set up transfer to mat with set up assist. Pt transferred WC<>mat via squat<>pivot with CGA/close supervision. Pt transferred sit<>stand with RW and min A x 2 trials and worked on dynamic standing balance creating pictures on pegboard with min/mod A for balance. Pt able to stand for 2 minutes x 1 trial and 45 seconds x 1 trial prior to needing to sit. Pt with difficulty weight shifting to R and maintaining R knee extension resulting in R knee flexion in stance and crouched posture. WC<>mat via squat<>pivot with supervision and transported back to room in [facility] total A. Pt requested to return to bed and transferred WC<>bed via squat<>pivot and doffed TLSO with supervision and shoes with total A. Educated pt on use of leg lifter to transfer sit<>supine while maintaining back precautions however pt  requested to try without leg lifter. Pt transferred sit<>supine with close supervision but essentially flinging RLE onto bed and did not maintain back precautions. Encouraged pt to use leg lifter because pt unable to maintain back precautions otherwise. Concluded session with pt supine in bed, needs within reach, and bed alarm on. NT present at bedside.   Therapy Documentation Precautions:  Precautions Precautions: Fall,Back Precaution Comments: Back precautions, TLSO EOB Required Braces or Orthoses: Spinal Brace Spinal Brace: Thoracolumbosacral orthotic,Applied in sitting position Restrictions Weight Bearing Restrictions: No  Therapy/Group: Individual Therapy Alfonse Alpers PT, DPT   05/24/2020, 7:25 AM

## 2020-05-24 NOTE — Progress Notes (Signed)
PROGRESS NOTE   Subjective/Complaints: Mr. Denz was upset that he woke up soaked in urine this morning- he received increased dose of baclofen to 20mg  and trazodone 25mg  last night.   ROS: +Constipation. Denies CP, SOB, N/V/D  Objective:   No results found. Recent Labs    05/22/20 1116  WBC 9.4  HGB 9.7*  HCT 30.8*  PLT 294   Recent Labs    05/22/20 1116  NA 136  K 4.1  CL 103  CO2 24  GLUCOSE 139*  BUN 17  CREATININE 1.24  CALCIUM 8.5*    Intake/Output Summary (Last 24 hours) at 05/24/2020 1512 Last data filed at 05/24/2020 1245 Gross per 24 hour  Intake 928 ml  Output 875 ml  Net 53 ml        Physical Exam: Vital Signs Blood pressure 105/64, pulse 75, temperature (!) 97.5 F (36.4 C), temperature source Oral, resp. rate 16, height 6\' 1"  (1.854 m), weight 80.5 kg, SpO2 97 %.  Gen: no distress, normal appearing HEENT: oral mucosa pink and moist, NCAT Cardio: Reg rate Chest: normal effort, normal rate of breathing Abd: soft, non-distended Ext: no edema Psych: Normal mood.  Normal behavior. Musc: No edema in extremities.  No tenderness in extremities. Neuro: Alert Motor: B/l UE: 5/5 proximal to distal LLE: 4+/5 proximal to distal, stable RLE: HF and KE 4-/5 (pain component too), ADF 4+/5, unchanged Sensation remains diminished to light touch RLE, prox more than distal  Assessment/Plan: 1. Functional deficits which require 3+ hours per day of interdisciplinary therapy in a comprehensive inpatient rehab setting.  Physiatrist is providing close team supervision and 24 hour management of active medical problems listed below.  Physiatrist and rehab team continue to assess barriers to discharge/monitor patient progress toward functional and medical goals  Care Tool:  Bathing  Bathing activity did not occur: Refused           Bathing assist       Upper Body Dressing/Undressing Upper body  dressing Upper body dressing/undressing activity did not occur (including orthotics): Refused What is the patient wearing?: Pull over shirt,Orthosis,Button up shirt Orthosis activity level: Performed by helper  Upper body assist Assist Level: Minimal Assistance - Patient > 75% (Supervision pull over shirt, Min assist with TLSO)    Lower Body Dressing/Undressing Lower body dressing    Lower body dressing activity did not occur: Refused What is the patient wearing?: Pants     Lower body assist Assist for lower body dressing: Minimal Assistance - Patient > 75%     Toileting Toileting Toileting Activity did not occur (Clothing management and hygiene only): N/A (no void or bm)  Toileting assist Assist for toileting: Dependent - Patient 0% (using Stedy)     Transfers Chair/bed transfer  Transfers assist  Chair/bed transfer activity did not occur: Refused  Chair/bed transfer assist level: Contact Guard/Touching assist     Locomotion Ambulation   Ambulation assist   Ambulation activity did not occur: Safety/medical concerns (pain and unable to stand)  Assist level: Maximal Assistance - Patient 25 - 49% Assistive device: Maxi Sky Max distance: 10ft   Walk 10 feet activity   Assist  Walk  10 feet activity did not occur: Safety/medical concerns        Walk 50 feet activity   Assist Walk 50 feet with 2 turns activity did not occur: Safety/medical concerns         Walk 150 feet activity   Assist Walk 150 feet activity did not occur: Safety/medical concerns         Walk 10 feet on uneven surface  activity   Assist Walk 10 feet on uneven surfaces activity did not occur: Safety/medical concerns         Wheelchair     Assist Will patient use wheelchair at discharge?: Yes Type of Wheelchair: Manual    Wheelchair assist level: Supervision/Verbal cueing Max wheelchair distance: >548ft    Wheelchair 50 feet with 2 turns activity    Assist         Assist Level: Supervision/Verbal cueing   Wheelchair 150 feet activity     Assist      Assist Level: Supervision/Verbal cueing   Medical Problem List and Plan: 1.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 Status post right transpedicular decompression microdiscectomy T10-11 with pedicle screw fixation T10-12 with posterior lateral arthrodesis 05/02/2020. Back brace when out of bed  Conitnue CIR  PRAFO nightly 2. Antithrombotics: -DVT/anticoagulation: Continue Xarelto  Left femoral and popliteal DVT -antiplatelet therapy: N/A 3. Pain Management:Continue Baclofen 10 mg 3 times daily, hydrocodone as needed. Robaxin as needed  Gabapentin 300 nightly started on 2/17  Controlled with meds on 2/25  Baclofen decreased to 10mg  HS as patient urinated on himself at night with higher dose.   Monitor with increased mobility 4. Mood:Provide emotional support -antipsychotic agents: N/A 5. Neuropsych: This patientiscapable of making decisions on hisown behalf. 6. Skin/Wound Care: Routine skin care  7. Fluids/Electrolytes/Nutrition:Routine in and outs 8. Hypertension.     Tenormin decreased to 12.5mg   3/2: BP is soft, d/c Tenormin  Monitor with increased mobility 9. Neurogenic bowel andbladder.   PVRs relatively unremarkable  Flomax 0.4 mg nightly  Bowel meds increased on 3/1 10. Hyperlipidemia. Lipitor 11. CKD stage III. Creatinine baseline 1.27.   Creatinine 1.24 on 2/28 12. Nausea:?  Resolved 13.  Sleep disturbance  Melatonin started on 2/16  ECG reviewed, Trazodone 25qhs d/ced as patient urinated on himself at night- could not wake to urinate 14.  Acute blood loss anemia  Hemoglobin 9.7 on 2/28  Hemoccult negative   LOS: 18 days A FACE TO FACE EVALUATION WAS PERFORMED  Krutika P Raulkar 05/24/2020, 3:12 PM

## 2020-05-24 NOTE — Progress Notes (Signed)
Pt has PRAFO boot on. Pt refused TEDS but is on xarelto. RN will continue to monitor.

## 2020-05-24 NOTE — Progress Notes (Signed)
Pt slept with PRAFO boot. Pt denied any spasms or pain in AM. RN will continue to monitor.

## 2020-05-24 NOTE — Progress Notes (Signed)
Patient ID: Timothy Ware, male   DOB: 1934/06/06, 86 y.o.   MRN: [id number]  Met with pt to discuss team conference progress toward goals of supervision-min and discharge 3/9. He is glad MD has addressed the medication issue and discharging one since he was out of it last night and this am. He had no idea he was in this world with the medications he received last night. His wife is better had back injected and went to chiropractor. Aware can have daughter's come in along with son if wants too. Will reach out to [facility] for resumption of services. Pt feels does not need family education prior to discharge since has been through this before and he, wife and son have a routine in place.

## 2020-05-24 NOTE — Progress Notes (Signed)
Physical Therapy Session Note  Patient Details  Name: Timothy Ware MRN: [id number] Date of Birth: Aug 02, 1934  Today's Date: 05/24/2020 PT Individual Time: 7943-2761 PT Individual Time Calculation (min): 59 min   Short Term Goals: Week 3:  PT Short Term Goal 1 (Week 3): STG=LTG due to LOS  Skilled Therapeutic Interventions/Progress Updates:   Pt received supine in bed and agreeable to PT. Supine>sit transfer with min assist through log roll tot he L. PT assisted pt to don TLSO sitting EOB. Pt reports pain incresed from 2/10 to 5/10 once in sitting position.  Squat pivot transfer to [facility] with CGA. cues for RLE position.  WC mobility through hall x21f, 1549f and 30079fnd over cement sidewalk 2 x 200f90fth supervision assist from PT and cues for hill management and improved anterior weight shift.   Seated BLE therex.  LAQ x 12, hamstring curl with manual resistance x 12, hip abduction x 12 with manual resistance, hip adduction x 12 with manual resisance. Ankle DF AAROM x 15, hip flexion x 12 with AaROM.  Cues for full ROM and increased hold time at end range throughout exercises.   Pt returned to room and performed Squat transfer to bed with CGA. Sit>supine completed with min assist at the RLE, and left supine in bed with call bell in reach and all needs met.       Therapy Documentation Precautions:  Precautions Precautions: Fall,Back Precaution Comments: Back precautions, TLSO EOB Required Braces or Orthoses: Spinal Brace Spinal Brace: Thoracolumbosacral orthotic,Applied in sitting position Restrictions Weight Bearing Restrictions: No    Vital Signs: Therapy Vitals Temp: (!) 97.5 F (36.4 C) Temp Source: Oral Pulse Rate: 75 Resp: 16 BP: 105/64 Patient Position (if appropriate): Sitting Oxygen Therapy SpO2: 97 % O2 Device: Room Air Pain: Pain Assessment Pain Scale: 0-10 Pain Score: 0-No pain    Therapy/Group: Individual Therapy  AustLorie Phenix/2022, 5:00 PM

## 2020-05-25 LAB — OCCULT BLOOD X 1 CARD TO LAB, STOOL: Fecal Occult Bld: NEGATIVE

## 2020-05-25 NOTE — Progress Notes (Signed)
Physical Therapy Session Note  Patient Details  Name: Timothy Ware MRN: [id number] Date of Birth: 02/17/1935  Today's Date: 05/25/2020 PT Individual Time: 8270-7867 and 1300-1410 PT Individual Time Calculation (min): 26 min and 70 min  Short Term Goals: Week 2:  PT Short Term Goal 1 (Week 2): pt will perform bed mobility with CGA overall PT Short Term Goal 1 - Progress (Week 2): Progressing toward goal PT Short Term Goal 2 (Week 2): pt will be able to tolerate OOB in chair x 2 hours a day PT Short Term Goal 2 - Progress (Week 2): Met PT Short Term Goal 3 (Week 2): pt will verbalize and demonstrate understanding of back precautions PT Short Term Goal 3 - Progress (Week 2): Met Week 3:  PT Short Term Goal 1 (Week 3): STG=LTG due to LOS  Skilled Therapeutic Interventions/Progress Updates:   Treatment Session 1: 1016-1042 26 min Received pt supine in bed, pt agreeable to therapy, and did not state pain level during session. Pt reported feeling much better today and was able to sleep without his leg "jumping" last night. Session with emphasis on functional mobility/transfers, generalized strengthening, dynamic standing balance/coordination, NMR, and improved activity tolerance. Pt transferred supine<>sitting EOB with supervision and donned TLSO with set up assist and shoes with total A. Pt transferred sit<>stand with RW and min A x 3 trials and worked on pre-gait stepping with LLE 3x10 reps with BUE support and min A with therapist guarding R knee. Pt with no true R knee buckling but multiple instances of R knee flexion -pt able to self correct with verbal cues.  Pt able to lift both UEs from walker for ~10 second intervals. Doffed TLSO with supervision and shoes with total A and transferred sit<>supine with min A for RLE management. Concluded session with pt supine in bed, needs within reach, and bed alarm on.   Treatment Session 2: 1300-1410 70 min Received pt sitting on commode with Stedy, pt  agreeable to therapy, and did not state pain level during session. MD present for afternoon rounds. Session with emphasis on toileting, functional mobility/transfers, generalized strengthening, dynamic standing balance/coordination, NMR, and improved activity tolerance. Pt with loose BM and able to perform peri-care with supervision and pull pants over hips standing in [place] with supervision. Dependent transfer to [facility] in [place] and donned TLSO with set up assist. Pt performed WC mobility 141f x 1 and >2076fx 1 using BUE and supervision to 4W dayroom. Pt performed seated BLE strengthening on Kinetron at 30cm/sec for 1 minute x 2 trials increasing to 20 cm/sec for 1 minute x 2 trials and then to 10 cm/sec for 1 minute x 1 trial with emphasis on R hip adduction and preventing excessive hip abduction as well and glute/quad strengthening. Pt able to maintain neutral hip alignment with min verbal cues. Pt required 2 attempts to transfer sit<>stand at table in dayroom with mod A x 3 trials (due to difficulty gripping table) and worked on dynamic standing balance for 2 minutes x 1 trial, 3 minutes x 1 trial, and 1 minute x 1 trial moving rings along arc with 1 UE support and min A for balance. Therapist guarding R knee but pt able to maintain R knee extension and weight shift to R with min verbal and tactile cues. Pt with SOB after standing requiring multiple extended rest breaks throughout session. [facility]. Concluded  session with pt sitting in WC, needs within reach, and chair pad alarm on.   Therapy Documentation Precautions:  Precautions Precautions: Fall,Back Precaution Comments: Back precautions, TLSO EOB Required Braces or Orthoses: Spinal Brace Spinal Brace: Thoracolumbosacral orthotic,Applied in sitting position Restrictions Weight Bearing Restrictions: No  Therapy/Group: Individual Therapy Alfonse Alpers PT, DPT   05/25/2020, 7:23 AM

## 2020-05-25 NOTE — Progress Notes (Signed)
PROGRESS NOTE   Subjective/Complaints: Patient's chart reviewed- No issues reported overnight BP hypotensive  ROS: +Constipation. Denies CP, SOB, N/V/D  Objective:   No results found. No results for input(s): WBC, HGB, HCT, PLT in the last 72 hours. No results for input(s): NA, K, CL, CO2, GLUCOSE, BUN, CREATININE, CALCIUM in the last 72 hours.  Intake/Output Summary (Last 24 hours) at 05/25/2020 1222 Last data filed at 05/25/2020 0726 Gross per 24 hour  Intake 804 ml  Output 1025 ml  Net -221 ml        Physical Exam: Vital Signs Blood pressure (!) 113/57, pulse 71, temperature 98.7 F (37.1 C), temperature source Oral, resp. rate 18, height 6\' 1"  (1.854 m), weight 80.5 kg, SpO2 98 %.  Gen: no distress, normal appearing HEENT: oral mucosa pink and moist, NCAT Cardio: Reg rate Chest: normal effort, normal rate of breathing Abd: soft, non-distended Ext: no edema Psych: Normal mood.  Normal behavior. Musc: No edema in extremities.  No tenderness in extremities. Neuro: Alert Motor: B/l UE: 5/5 proximal to distal LLE: 4+/5 proximal to distal, stable RLE: HF and KE 4-/5 (pain component too), ADF 4+/5, unchanged Sensation remains diminished to light touch RLE, prox more than distal  Assessment/Plan: 1. Functional deficits which require 3+ hours per day of interdisciplinary therapy in a comprehensive inpatient rehab setting.  Physiatrist is providing close team supervision and 24 hour management of active medical problems listed below.  Physiatrist and rehab team continue to assess barriers to discharge/monitor patient progress toward functional and medical goals  Care Tool:  Bathing  Bathing activity did not occur: Refused           Bathing assist       Upper Body Dressing/Undressing Upper body dressing Upper body dressing/undressing activity did not occur (including orthotics): Refused What is the patient  wearing?: Pull over shirt,Orthosis,Button up shirt Orthosis activity level: Performed by helper  Upper body assist Assist Level: Minimal Assistance - Patient > 75% (Supervision pull over shirt, Min assist with TLSO)    Lower Body Dressing/Undressing Lower body dressing    Lower body dressing activity did not occur: Refused What is the patient wearing?: Pants     Lower body assist Assist for lower body dressing: Minimal Assistance - Patient > 75%     Toileting Toileting Toileting Activity did not occur (Clothing management and hygiene only): N/A (no void or bm)  Toileting assist Assist for toileting: Dependent - Patient 0% (using Stedy)     Transfers Chair/bed transfer  Transfers assist  Chair/bed transfer activity did not occur: Refused  Chair/bed transfer assist level: Contact Guard/Touching assist     Locomotion Ambulation   Ambulation assist   Ambulation activity did not occur: Safety/medical concerns (pain and unable to stand)  Assist level: Maximal Assistance - Patient 25 - 49% Assistive device: Maxi Sky Max distance: 26ft   Walk 10 feet activity   Assist  Walk 10 feet activity did not occur: Safety/medical concerns        Walk 50 feet activity   Assist Walk 50 feet with 2 turns activity did not occur: Safety/medical concerns  Walk 150 feet activity   Assist Walk 150 feet activity did not occur: Safety/medical concerns         Walk 10 feet on uneven surface  activity   Assist Walk 10 feet on uneven surfaces activity did not occur: Safety/medical concerns         Wheelchair     Assist Will patient use wheelchair at discharge?: Yes Type of Wheelchair: Manual    Wheelchair assist level: Supervision/Verbal cueing Max wheelchair distance: >550ft    Wheelchair 50 feet with 2 turns activity    Assist        Assist Level: Supervision/Verbal cueing   Wheelchair 150 feet activity     Assist      Assist  Level: Supervision/Verbal cueing   Medical Problem List and Plan: 1.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 Status post right transpedicular decompression microdiscectomy T10-11 with pedicle screw fixation T10-12 with posterior lateral arthrodesis 05/02/2020. Back brace when out of bed  Continue CIR  PRAFO nightly 2. Antithrombotics: -DVT/anticoagulation: Continue Xarelto  Left femoral and popliteal DVT -antiplatelet therapy: N/A 3. Pain Management:Continue Baclofen 10 mg 3 times daily, hydrocodone as needed. Robaxin as needed  Gabapentin 300 nightly started on 2/17  Controlled with meds on 2/25  Baclofen decreased to 10mg  HS as patient urinated on himself at night with higher dose.   Monitor with increased mobility 4. Mood:Provide emotional support -antipsychotic agents: N/A 5. Neuropsych: This patientiscapable of making decisions on hisown behalf. 6. Skin/Wound Care: Routine skin care  7. Fluids/Electrolytes/Nutrition:Routine in and outs 8. Hypertension.     Tenormin decreased to 12.5mg   3/2: BP is soft, d/c Tenormin  3/3: continues to be soft, continue to assess for effects of yesterday's change  Monitor with increased mobility 9. Neurogenic bowel andbladder.   PVRs relatively unremarkable  Flomax 0.4 mg nightly  Bowel meds increased on 3/1 10. Hyperlipidemia. Continue Lipitor 11. CKD stage III. Creatinine baseline 1.27.   Creatinine 1.24 on 2/28 12. Nausea:?  Resolved 13.  Sleep disturbance  Melatonin started on 2/16  ECG reviewed, Trazodone 25qhs d/ced as patient urinated on himself at night- could not wake to urinate 14.  Acute blood loss anemia  Hemoglobin 9.7 on 2/28  Hemoccult negative   LOS: 19 days A FACE TO FACE EVALUATION WAS PERFORMED  3/28 P Alvan Culpepper 05/25/2020, 12:22 PM

## 2020-05-25 NOTE — Progress Notes (Signed)
Occupational Therapy Session Note  Patient Details  Name: Timothy Ware MRN: [id number] Date of Birth: March 06, 1935  Today's Date: 05/25/2020 OT Individual Time: 7782-4235 OT Individual Time Calculation (min): 60 min    Short Term Goals: Week 3:  OT Short Term Goal 1 (Week 3): STG = LTGs due to remaining LOS  Skilled Therapeutic Interventions/Progress Updates:    Pt received in bed and agreeable to a bath and dressing.  See ADL documentation below for details. Now that he has more active movement in his R leg he was able to do more with LB self care.  Therapist only needed to wash R foot and pt able to do other body parts by pulling left leg up, log rolling and reaching without twist to reach bottom.  Therapist started pants over R foot and pt able to get left in, used bridging to pull over hips.   Did well sitting EOB for all UB self care.  Pt did not want to get to wc as he had a gap in time until next therapy.  Therefore did not don brace. Pt resting in bed with all needs met and alarm set.   Therapy Documentation Precautions:  Precautions Precautions: Fall,Back Precaution Comments: Back precautions, TLSO EOB Required Braces or Orthoses: Spinal Brace Spinal Brace: Thoracolumbosacral orthotic,Applied in sitting position Restrictions Weight Bearing Restrictions: No   Pain: no c/o pain   ADL: ADL Eating: Independent Grooming: Modified independent Where Assessed-Grooming: Edge of bed Upper Body Bathing: Supervision/safety Where Assessed-Upper Body Bathing: Edge of bed Lower Body Bathing: Minimal assistance Where Assessed-Lower Body Bathing: Bed level Upper Body Dressing: Supervision/safety Where Assessed-Upper Body Dressing: Edge of bed Lower Body Dressing: Minimal assistance Where Assessed-Lower Body Dressing: Bed level Toileting: Unable to assess Toilet Transfer: Unable to assess Tub/Shower Transfer: Unable to assess Tub/Shower Transfer Method: Unable to  assess  Therapy/Group: Individual Therapy  [place] 05/25/2020, 12:41 PM

## 2020-05-26 LAB — OCCULT BLOOD X 1 CARD TO LAB, STOOL: Fecal Occult Bld: NEGATIVE

## 2020-05-26 MED ORDER — BACLOFEN 10 MG PO TABS
15.0000 mg | ORAL_TABLET | Freq: Every day | ORAL | Status: DC
  Administered 2020-05-26 – 2020-05-29 (×4): 15 mg via ORAL
  Filled 2020-05-26 (×4): qty 2

## 2020-05-26 NOTE — Progress Notes (Signed)
Physical Therapy Session Note  Patient Details  Name: Timothy Ware MRN: [id number] Date of Birth: [birth year]  Today's Date: 05/26/2020 PT Individual Time: 0850-0930 PT Individual Time Calculation (min): 40 min   Short Term Goals: Week 3:  PT Short Term Goal 1 (Week 3): STG=LTG due to LOS  Skilled Therapeutic Interventions/Progress Updates:  Pt received sitting in on BSC over toilet and agreeable to PT upon completion of BM. Sit>stand in stedy with min assist for PT to perform pericare. Stedy transfer to [facility] with min assist to power into standing.   WC mobility through hall of rehab unit and hospital x 121f, 1520f [id number] for safety with doorway management and access to elevator.   Sit<>stand with RW after 2 attempts with min-mod assist and cues for LE and UE positioning to push from L arm rest. Pt with noticeable difficulty initating knee extension on the RLE in stance.   pregait stepping with RW x 3 BLE with cues to muscle activation sequence on the RLE and improved knee terminal knee extension in stance on the R. PT applied Blue rocker AFO to assist with knee extension. Sit<>stand in parallel bars x 2 with forward/reverse gait x 3 steps bil for 2 bouts. Poor WB through heel to allow use extensor moment of AFO. Max assist for advancement of the RLE throughout.   Pt returned to room and performed squat pivot transfer to bed with supervision A. Sit>supine completed with min assist at the RLE and left supine in bed with call bell in reach and all needs met.         Therapy Documentation Precautions:  Precautions Precautions: Fall,Back Precaution Comments: Back precautions, TLSO EOB Required Braces or Orthoses: Spinal Brace Spinal Brace: Thoracolumbosacral orthotic,Applied in sitting position Restrictions Weight Bearing Restrictions: No   Pain: Pain Assessment Pain Scale: 0-10 Pain Score: 0-No pain Faces Pain Scale: No hurt Pain Type: Acute pain Pain Location:  Back   Therapy/Group: Individual Therapy  AustLorie Phenix/2022, 9:38 AM

## 2020-05-26 NOTE — Progress Notes (Signed)
Occupational Therapy Session Note  Patient Details  Name: Timothy Ware MRN: [id number] Date of Birth: Mar 20, 1935  Today's Date: 05/26/2020 OT Individual Time: 5427-0623 and 7628-3151 OT Individual Time Calculation (min): 30 min and 30 min   Short Term Goals: Week 3:  OT Short Term Goal 1 (Week 3): STG = LTGs due to remaining LOS  Skilled Therapeutic Interventions/Progress Updates:    1) Treatment session with focus on RLE strengthening and dynamic standing balance.  Pt received supine in bed reporting "weakness all over".  Engaged in RLE hip flexion and heel slides in bed with 2 sets of 5 each with focus on motor control.  Pt transferred to sitting EOB with supervision and use of log roll.  Pt able to don TLSO with setup this session.  Engaged in blocked practice sit > stand in [place] with focus on R knee activation in standing.  Engaged in weight shifting R and L to further challenge RLE activation.  Pt completed sit > stand x3 and tolerated standing 1.5-2 mins in [place] with UE support but not utilizing knee block.  Pt returned to supine after doffing TLSO and left supine in bed with all needs in reach.  2) Treatment session with focus on d/c planning.  Pt received supine in bed reporting "weakness all over".  Engaged in discussion regarding upcoming d/c and routine with bathing, dressing, and toileting.  Pt has focused minimally on self-care specific tasks during rehab stay as pt reports he can do it at home and pt has demonstrated toilet transfers with CGA to Supervision lateral scoot and clothing management EOB and at bed level with assistance to pull pants over hips when pt boosts from EOB or toilet.  Pt reiterates placement of drop arm BSC and typical routine with toileting.  Pt also reports washing up at kitchen table with wife setting up items.  Pt will continue to require min assist to wash buttocks when boosting up from chair and min assist for clothing management in same manner.  Pt reports  frustration with weakness and decreased endurance but hopeful for improved strength prior to d/c home.  Therapy Documentation Precautions:  Precautions Precautions: Fall,Back Precaution Comments: Back precautions, TLSO EOB Required Braces or Orthoses: Spinal Brace Spinal Brace: Thoracolumbosacral orthotic,Applied in sitting position Restrictions Weight Bearing Restrictions: No Pain:  Pt with no c/o pain   Therapy/Group: Individual Therapy  Rosalio Loud 05/26/2020, 1:39 PM

## 2020-05-26 NOTE — Progress Notes (Signed)
PROGRESS NOTE   Subjective/Complaints: Slept poorly last night due to muscle spasms. Will increase Balcofen to 15mg  at night He has no other complaints currently  ROS:  Denies CP, SOB, N/V/D, + insomnia  Objective:   No results found. No results for input(s): WBC, HGB, HCT, PLT in the last 72 hours. No results for input(s): NA, K, CL, CO2, GLUCOSE, BUN, CREATININE, CALCIUM in the last 72 hours.  Intake/Output Summary (Last 24 hours) at 05/26/2020 1019 Last data filed at 05/26/2020 0905 Gross per 24 hour  Intake 600 ml  Output 1425 ml  Net -825 ml        Physical Exam: Vital Signs Blood pressure 120/63, pulse 66, temperature (!) 97.5 F (36.4 C), temperature source Oral, resp. rate 19, height 6\' 1"  (1.854 m), weight 80.5 kg, SpO2 100 %.  Gen: no distress, normal appearing HEENT: oral mucosa pink and moist, NCAT Cardio: Reg rate Chest: normal effort, normal rate of breathing Abd: soft, non-distended Ext: no edema Psych: pleasant, normal affect Skin: intact Musc: No edema in extremities.  No tenderness in extremities. Neuro: Alert Motor: B/l UE: 5/5 proximal to distal LLE: 4+/5 proximal to distal, stable RLE: HF and KE 4-/5 (pain component too), ADF 4+/5, unchanged Sensation remains diminished to light touch RLE, prox more than distal  Assessment/Plan: 1. Functional deficits which require 3+ hours per day of interdisciplinary therapy in a comprehensive inpatient rehab setting.  Physiatrist is providing close team supervision and 24 hour management of active medical problems listed below.  Physiatrist and rehab team continue to assess barriers to discharge/monitor patient progress toward functional and medical goals  Care Tool:  Bathing  Bathing activity did not occur: Refused Body parts bathed by patient: Right arm,Left arm,Chest,Abdomen,Front perineal area,Buttocks,Right upper leg,Left upper leg,Left lower leg    Body parts bathed by helper: Right lower leg     Bathing assist Assist Level: Minimal Assistance - Patient > 75% (bed level)     Upper Body Dressing/Undressing Upper body dressing Upper body dressing/undressing activity did not occur (including orthotics): Refused What is the patient wearing?: Pull over shirt,Button up shirt Orthosis activity level: Performed by helper  Upper body assist Assist Level: Set up assist    Lower Body Dressing/Undressing Lower body dressing    Lower body dressing activity did not occur: Refused What is the patient wearing?: Pants     Lower body assist Assist for lower body dressing: Minimal Assistance - Patient > 75%     Toileting Toileting Toileting Activity did not occur (Clothing management and hygiene only): N/A (no void or bm)  Toileting assist Assist for toileting: Dependent - Patient 0% (using Stedy)     Transfers Chair/bed transfer  Transfers assist  Chair/bed transfer activity did not occur: Refused  Chair/bed transfer assist level: Contact Guard/Touching assist     Locomotion Ambulation   Ambulation assist   Ambulation activity did not occur: Safety/medical concerns (pain and unable to stand)  Assist level: Maximal Assistance - Patient 25 - 49% Assistive device: Maxi Sky Max distance: 19ft   Walk 10 feet activity   Assist  Walk 10 feet activity did not occur: Safety/medical concerns  Walk 50 feet activity   Assist Walk 50 feet with 2 turns activity did not occur: Safety/medical concerns         Walk 150 feet activity   Assist Walk 150 feet activity did not occur: Safety/medical concerns         Walk 10 feet on uneven surface  activity   Assist Walk 10 feet on uneven surfaces activity did not occur: Safety/medical concerns         Wheelchair     Assist Will patient use wheelchair at discharge?: Yes Type of Wheelchair: Manual    Wheelchair assist level: Supervision/Verbal  cueing Max wheelchair distance: >515ft    Wheelchair 50 feet with 2 turns activity    Assist        Assist Level: Supervision/Verbal cueing   Wheelchair 150 feet activity     Assist      Assist Level: Supervision/Verbal cueing   Medical Problem List and Plan: 1.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 Status post right transpedicular decompression microdiscectomy T10-11 with pedicle screw fixation T10-12 with posterior lateral arthrodesis 05/02/2020. Back brace when out of bed  Continue CIR  PRAFO nightly 2. Antithrombotics: -DVT/anticoagulation: Continue Xarelto  Left femoral and popliteal DVT -antiplatelet therapy: N/A 3. Pain Management:Continue Baclofen 10 mg 3 times daily, hydrocodone as needed. Robaxin as needed  Gabapentin 300 nightly started on 2/17  Controlled with meds on 2/25  Waking at night with spasms: increase Baclofen to 15mg .    Monitor with increased mobility 4. Mood:Provide emotional support -antipsychotic agents: N/A 5. Neuropsych: This patientiscapable of making decisions on hisown behalf. 6. Skin/Wound Care: Routine skin care  7. Fluids/Electrolytes/Nutrition:Routine in and outs 8. Hypertension.     Tenormin decreased to 12.5mg   3/2: BP is soft, d/c Tenormin  3/3-4: better controlled, continue to monitor.   Monitor with increased mobility 9. Neurogenic bowel andbladder.   PVRs relatively unremarkable  Flomax 0.4 mg nightly  Bowel meds increased on 3/1 10. Hyperlipidemia. Continue Lipitor 11. CKD stage III. Creatinine baseline 1.27.   Creatinine 1.24 on 2/28 12. Nausea:?  Resolved 13.  Sleep disturbance  Melatonin started on 2/16  ECG reviewed, Trazodone 25qhs d/ced as patient urinated on himself at night- could not wake to urinate 14.  Acute blood loss anemia  Hemoglobin 9.7 on 2/28  Hemoccult negative   LOS: 20 days A FACE TO FACE  EVALUATION WAS PERFORMED  3/28 P Raulkar 05/26/2020, 10:19 AM

## 2020-05-26 NOTE — Progress Notes (Signed)
Physical Therapy Session Note  Patient Details  Name: Timothy Ware MRN: [id number] Date of Birth: [birth year]  Today's Date: 05/26/2020 PT Individual Time: 1505-1600 PT Individual Time Calculation (min): 55 min   Short Term Goals: Week 3:  PT Short Term Goal 1 (Week 3): STG=LTG due to LOS  Skilled Therapeutic Interventions/Progress Updates:    Patient in supine reports needing to toilet.  Has had several BM's since medicated to start.  Performed rolling to L and side to sit with S and increased time.  Patient transferred via [facility] to toilet. Patient performing hygiene, but toilet in the way so sit to stand to Stedy min A and max A for toilet hygiene.  Patient able to get pants partway up.  Assisted back to EOB via Stedy and pt donned brace with min A.  Performed scoot pivot to w/c with S.  Propelled in w/c to therapy gym with S.  Patient in parallel bars sit to stand mod A and performed standing weight shifts with min A with heavy UE support and cues/facilitation for hip extension and R knee extension.  Patient stood again and ambulated in bars x 8' with mod A and cues for safety with turning.  Patient third time standing for taps to 2" beam with UE support 2 x 5 reps.  Propelled in w/c to dayroom and seated for LE therex on Kinetron @ 20-30 cm/sec x 2 bouts of 2 min each.  Patient propelled up to room with S and left in w/c in room with needs accessible.   Therapy Documentation Precautions:  Precautions Precautions: Fall,Back Precaution Comments: Back precautions, TLSO EOB Required Braces or Orthoses: Spinal Brace Spinal Brace: Thoracolumbosacral orthotic,Applied in sitting position Restrictions Weight Bearing Restrictions: No Pain: Pain Assessment Pain Score: 0-No pain   Therapy/Group: Individual Therapy  Elray Mcgregor  Sheran Lawless, PT 05/26/2020, 3:39 PM

## 2020-05-27 MED ORDER — BACLOFEN 10 MG PO TABS
10.0000 mg | ORAL_TABLET | Freq: Two times a day (BID) | ORAL | Status: DC
  Administered 2020-05-27 – 2020-05-30 (×5): 10 mg via ORAL
  Filled 2020-05-27 (×6): qty 1

## 2020-05-27 NOTE — Progress Notes (Addendum)
PROGRESS NOTE   Subjective/Complaints:   Pt slept well without spasms, starting to get more this am   ROS:  Denies CP, SOB, N/V/D, + insomnia  Objective:   No results found. No results for input(s): WBC, HGB, HCT, PLT in the last 72 hours. No results for input(s): NA, K, CL, CO2, GLUCOSE, BUN, CREATININE, CALCIUM in the last 72 hours.  Intake/Output Summary (Last 24 hours) at 05/27/2020 0927 Last data filed at 05/27/2020 0759 Gross per 24 hour  Intake 700 ml  Output 1300 ml  Net -600 ml        Physical Exam: Vital Signs Blood pressure 108/64, pulse 79, temperature 98.3 F (36.8 C), temperature source Oral, resp. rate 14, height 6\' 1"  (1.854 m), weight 80.5 kg, SpO2 98 %.   General: No acute distress Mood and affect are appropriate Heart: Regular rate and rhythm no rubs murmurs or extra sounds Lungs: Clear to auscultation, breathing unlabored, no rales or wheezes Abdomen: Positive bowel sounds, soft nontender to palpation, nondistended Extremities: No clubbing, cyanosis, or edema   Neuro:DTR 1+ patellar, neg clonus at ankle  Alert Motor: B/l UE: 5/5 proximal to distal LLE: 4+/5 proximal to distal, stable RLE: HF and KE 3-/5 (pain component too), ADF 4+/5, unchanged Sensation remains diminished to light touch RLE, prox more than distal  Assessment/Plan: 1. Functional deficits which require 3+ hours per day of interdisciplinary therapy in a comprehensive inpatient rehab setting.  Physiatrist is providing close team supervision and 24 hour management of active medical problems listed below.  Physiatrist and rehab team continue to assess barriers to discharge/monitor patient progress toward functional and medical goals  Care Tool:  Bathing  Bathing activity did not occur: Refused Body parts bathed by patient: Right arm,Left arm,Chest,Abdomen,Front perineal area,Buttocks,Right upper leg,Left upper leg,Left lower  leg   Body parts bathed by helper: Right lower leg     Bathing assist Assist Level: Minimal Assistance - Patient > 75% (bed level)     Upper Body Dressing/Undressing Upper body dressing Upper body dressing/undressing activity did not occur (including orthotics): Refused What is the patient wearing?: Pull over shirt,Button up shirt Orthosis activity level: Performed by helper  Upper body assist Assist Level: Set up assist    Lower Body Dressing/Undressing Lower body dressing    Lower body dressing activity did not occur: Refused What is the patient wearing?: Pants     Lower body assist Assist for lower body dressing: Minimal Assistance - Patient > 75%     Toileting Toileting Toileting Activity did not occur (Clothing management and hygiene only): N/A (no void or bm)  Toileting assist Assist for toileting: Dependent - Patient 0% (using Stedy)     Transfers Chair/bed transfer  Transfers assist  Chair/bed transfer activity did not occur: Refused  Chair/bed transfer assist level: Supervision/Verbal cueing     Locomotion Ambulation   Ambulation assist   Ambulation activity did not occur: Safety/medical concerns (pain and unable to stand)  Assist level: Moderate Assistance - Patient 50 - 74% Assistive device: Parallel bars Max distance: 8'   Walk 10 feet activity   Assist  Walk 10 feet activity did not occur: Safety/medical concerns  Walk 50 feet activity   Assist Walk 50 feet with 2 turns activity did not occur: Safety/medical concerns         Walk 150 feet activity   Assist Walk 150 feet activity did not occur: Safety/medical concerns         Walk 10 feet on uneven surface  activity   Assist Walk 10 feet on uneven surfaces activity did not occur: Safety/medical concerns         Wheelchair     Assist Will patient use wheelchair at discharge?: Yes Type of Wheelchair: Manual    Wheelchair assist level: Supervision/Verbal  cueing Max wheelchair distance: >537ft    Wheelchair 50 feet with 2 turns activity    Assist        Assist Level: Supervision/Verbal cueing   Wheelchair 150 feet activity     Assist      Assist Level: Supervision/Verbal cueing   Medical Problem List and Plan: 1.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 Status post right transpedicular decompression microdiscectomy T10-11 with pedicle screw fixation T10-12 with posterior lateral arthrodesis 05/02/2020. Back brace when out of bed  Continue CIR PT, OT,  PRAFO nightly 2. Antithrombotics: -DVT/anticoagulation: Continue Xarelto  Left femoral and popliteal DVT -antiplatelet therapy: N/A 3. Pain Management:Continue Baclofen 10 mg 3 times daily, hydrocodone as needed. Robaxin as needed  Gabapentin 300 nightly started on 2/17  Controlled with meds on 2/25  Waking at night with spasms: increase Baclofen to 15mg .  Add 10mg  q 8am an dq 1pm , monitor for sedation   Monitor with increased mobility 4. Mood:Provide emotional support -antipsychotic agents: N/A 5. Neuropsych: This patientiscapable of making decisions on hisown behalf. 6. Skin/Wound Care: Routine skin care  7. Fluids/Electrolytes/Nutrition:Routine in and outs 8. Hypertension.     Tenormin decreased to 12.5mg   3/2: BP is soft, d/c Tenormin  3/3-4: better controlled, continue to monitor.   Monitor with increased mobility 9. Neurogenic bowel andbladder.   PVRs relatively unremarkable  Flomax 0.4 mg nightly  Bowel meds increased on 3/1, LBM 3/4 10. Hyperlipidemia. Continue Lipitor 11. CKD stage III. Creatinine baseline 1.27.   Creatinine 1.24 on 2/28 12. Nausea:?  Resolved 13.  Sleep disturbance  Melatonin started on 2/16  ECG reviewed, Trazodone 25qhs d/ced as patient urinated on himself at night- could not wake to urinate 14.  Acute blood loss anemia  Hemoglobin 9.7  on 2/28  Hemoccult negative   LOS: 21 days A FACE TO FACE EVALUATION WAS PERFORMED  3/16 05/27/2020, 9:27 AM

## 2020-05-28 NOTE — Progress Notes (Signed)
Occupational Therapy Session Note  Patient Details  Name: Timothy Ware MRN: [id number] Date of Birth: [birth year]  Today's Date: 05/28/2020 OT Individual Time: 1345-1430 OT Individual Time Calculation (min): 45 min   Short Term Goals: Week 3:  OT Short Term Goal 1 (Week 3): STG = LTGs due to remaining LOS  Skilled Therapeutic Interventions/Progress Updates:    Pt greeted in bed, stating that he had a spell of very intense pain earlier. Was now lying on a heating pad and medicated for pain by RN. Pt requesting bedlevel therapy at this time in order to not aggravate back pain any further. With [facility] elevated, OT guided pt through UB exercises using 1# dumbbells x10-40 reps each exercise per pts tolerance. Mod facilitation for the Lt UE during 75% of exercises due to proximal weakness from past RTC injury. Pt reported exercises did not worsen pain. Also led pt through gentle shoulder stretches. At end of session pt remained in bed with all needs within reach, bed alarm set, and family present.  Therapy Documentation Precautions:  Precautions Precautions: Fall,Back Precaution Comments: Back precautions, TLSO EOB Required Braces or Orthoses: Spinal Brace Spinal Brace: Thoracolumbosacral orthotic,Applied in sitting position Restrictions Weight Bearing Restrictions: No ADL: ADL Eating: Independent Grooming: Modified independent Where Assessed-Grooming: Edge of bed Upper Body Bathing: Supervision/safety Where Assessed-Upper Body Bathing: Edge of bed Lower Body Bathing: Minimal assistance Where Assessed-Lower Body Bathing: Bed level Upper Body Dressing: Supervision/safety Where Assessed-Upper Body Dressing: Edge of bed Lower Body Dressing: Minimal assistance Where Assessed-Lower Body Dressing: Bed level Toileting: Unable to assess Toilet Transfer: Unable to assess Tub/Shower Transfer: Unable to assess Tub/Shower Transfer Method: Unable to assess      Therapy/Group: Individual  Therapy  Maxmillian Carsey A Lawerence Dery 05/28/2020, 3:58 PM

## 2020-05-28 NOTE — Progress Notes (Signed)
Physical Therapy Session Note  Patient Details  Name: Timothy Ware MRN: [id number] Date of Birth: 1934-06-14  Today's Date: 05/28/2020 PT Individual Time: 0900-0954 PT Individual Time Calculation (min): 54 min   Short Term Goals: Week 2:  PT Short Term Goal 1 (Week 2): pt will perform bed mobility with CGA overall PT Short Term Goal 1 - Progress (Week 2): Progressing toward goal PT Short Term Goal 2 (Week 2): pt will be able to tolerate OOB in chair x 2 hours a day PT Short Term Goal 2 - Progress (Week 2): Met PT Short Term Goal 3 (Week 2): pt will verbalize and demonstrate understanding of back precautions PT Short Term Goal 3 - Progress (Week 2): Met Week 3:  PT Short Term Goal 1 (Week 3): STG=LTG due to LOS  Skilled Therapeutic Interventions/Progress Updates:   Received pt supine in bed reporting having a "rough" night. Pt stated he woke up twice in the middle of the night soiled in urine and with no awareness that he wet himself; possibly due to medications. Notified RN. Pt agreeable to therapy, and did not state pain level during session. Session with emphasis on functional mobility/transfers, generalized strengthening, dynamic standing balance/coordination, NMR, and improved activity tolerance. Pt transferred supine<>sitting EOB with supervision and use of bedrails and donned TLSO with set up assist and shoes with total A. Squat<>pivot bed<>WC with close supervision and pt performed WC mobility 160f x 1 and 1069fx 1 using BUE and supervision to 4W therapy gym. Pt able to se tup transfer to mat mod I but required supervision for squat<>pivot transfer to mat. Pt transferred sit<>stand with RW and min A x 2 trials and worked on LLE toe taps to 3in step with BUE support on RW 1x10 and 1x6 reps to fatigue. Stopped activity due to reports of "giving out" and increased fatigue. Pt able to extend R knee without assist (but therapist still guarding knee). Pt with R knee flexed in stance but pt able  to prevent any true buckling independently. Sit<>supine on wedge with min A for RLE management while maintaining spinal precautions. Pt performed the following exercises in supine: -SAQ 2x12 on LLE with 2.5lb ankle weight and unweighted on RLE 3x8 using mirror for visual feedback -pt pleased that he was able to actively perform motion on RLE. -active assisted heel slides with blue TB 2x5 using mirror for visual feedback  Supine<>R sidelying<>sitting on mat with mod A and squat<>pivot mat<>WC with supervision. [facility] Concluded session with pt sitting in WC, needs within reach, and chair pad alarm on.   Therapy Documentation Precautions:  Precautions Precautions: Fall,Back Precaution Comments: Back precautions, TLSO EOB Required Braces or Orthoses: Spinal Brace Spinal Brace: Thoracolumbosacral orthotic,Applied in sitting position Restrictions Weight Bearing Restrictions: No   Therapy/Group: Individual Therapy AnAlfonse AlpersT, DPT   05/28/2020, 7:16 AM

## 2020-05-29 NOTE — Progress Notes (Signed)
Patient ID: Minor Iden, male   DOB: 10-Oct-1934, 86 y.o.   MRN: [id number]  Spoke with [facility] to make new referral since pt had been discharged since being re-admitted to the hospital. Has all equipment from previous admission. Work toward discharge Wed.

## 2020-05-29 NOTE — Progress Notes (Signed)
Occupational Therapy Session Note  Patient Details  Name: Timothy Ware MRN: [id number] Date of Birth: [birth year]  Today's Date: 05/29/2020 OT Individual Time: 6629-4765 OT Individual Time Calculation (min): 35 min  and Today's Date: 05/29/2020 OT Missed Time: 10 Minutes Missed Time Reason: Patient fatigue   Short Term Goals: Week 3:  OT Short Term Goal 1 (Week 3): STG = LTGs due to remaining LOS  Skilled Therapeutic Interventions/Progress Updates:    Limited treatment session with focus on self-care retraining and d/c planning.  Pt semi-reclined in bed reporting that he keeps dozing off and is having difficulty remaining awake.  Pt reports feeling like he "can't focus".  Engaged in discussion regarding upcoming d/c and pt progress towards goals.  Pt reports changing pants frequently overnight due to frequent incontinence.  Pt declined any bathing/dressing this AM but agreeable to completing tomorrow in preparation for d/c.  Pt continues to report inability to focus and requesting to rest until next session.  Pt missed 10 mins due to fatigue.  Therapy Documentation Precautions:  Precautions Precautions: Fall,Back Precaution Comments: Back precautions, TLSO EOB Required Braces or Orthoses: Spinal Brace Spinal Brace: Thoracolumbosacral orthotic,Applied in sitting position Restrictions Weight Bearing Restrictions: No Pain: Pain Assessment Pain Scale: 0-10 Pain Score: 0-No pain   Therapy/Group: Individual Therapy  Rosalio Loud 05/29/2020, 12:14 PM

## 2020-05-29 NOTE — Discharge Summary (Addendum)
Physician Discharge Summary  Patient ID: Timothy LungOtis Ware MRN: [id number] DOB/AGE: [birth year] 84 y.o.  Admit date: 05/06/2020 Discharge date: 05/31/2020  Discharge Diagnoses:  Principal Problem:   Thoracic myelopathy Active Problems:   Stage 3a chronic kidney disease (HCC)   Neurogenic bowel   Acute deep vein thrombosis (DVT) of femoral vein of left lower extremity (HCC)   Sleep disturbance   Benign essential HTN Pain management Hyperlipidemia Acute blood loss anemia CAD with angioplasty  Discharged Condition: Stable  Significant Diagnostic Studies: DG Thoracic Spine 2 View  Result Date: 05/02/2020 CLINICAL DATA:  Surgery, elective. Additional history provided: Surgery: Right transpedicular decompression/micro discectomy at thoracic 10-11 with fixation thoracic 10 to thoracic 12. Provided fluoroscopy time 20 seconds (11.18 mGy). EXAM: THORACIC SPINE 2 VIEWS; DG C-ARM 1-60 MIN COMPARISON:  MRI of the thoracic spine 04/26/2020. FINDINGS: PA and lateral view intraoperative fluoroscopic images of the thoracic spine are submitted, 3 images total. The images demonstrate bilateral pedicle screws at what are presumed to be the T10 and T12 levels. Vertical interconnecting rods were not present at the time the images were taken. Overlying retractors. IMPRESSION: 3 intraoperative fluoroscopic images of the thoracic spine, as described. Electronically Signed   By: Jackey LogeKyle  Golden DO   On: 05/02/2020 17:10   DG C-Arm 1-60 Min  Result Date: 05/02/2020 CLINICAL DATA:  Surgery, elective. Additional history provided: Surgery: Right transpedicular decompression/micro discectomy at thoracic 10-11 with fixation thoracic 10 to thoracic 12. Provided fluoroscopy time 20 seconds (11.18 mGy). EXAM: THORACIC SPINE 2 VIEWS; DG C-ARM 1-60 MIN COMPARISON:  MRI of the thoracic spine 04/26/2020. FINDINGS: PA and lateral view intraoperative fluoroscopic images of the thoracic spine are submitted, 3 images total. The images  demonstrate bilateral pedicle screws at what are presumed to be the T10 and T12 levels. Vertical interconnecting rods were not present at the time the images were taken. Overlying retractors. IMPRESSION: 3 intraoperative fluoroscopic images of the thoracic spine, as described. Electronically Signed   By: Jackey LogeKyle  Golden DO   On: 05/02/2020 17:10   VAS US LOWER EXTREMITY VENOUS (DVT)  Result Date: 05/08/2020  Lower Venous DVT Study Indications: Swelling.  Risk Factors: None identified. Limitations: Poor ultrasound/tissue interface. Comparison Study: No prior studies. Performing Technologist: Chanda BusingGregory Collins RVT  Examination Guidelines: A complete evaluation includes B-mode imaging, spectral Doppler, color Doppler, and power Doppler as needed of all accessible portions of each vessel. Bilateral testing is considered an integral part of a complete examination. Limited examinations for reoccurring indications may be performed as noted. The reflux portion of the exam is performed with the patient in reverse Trendelenburg.  +---------+---------------+---------+-----------+----------+--------------+ RIGHT    CompressibilityPhasicitySpontaneityPropertiesThrombus Aging +---------+---------------+---------+-----------+----------+--------------+ CFV      Full           Yes      Yes                                 +---------+---------------+---------+-----------+----------+--------------+ SFJ      Full                                                        +---------+---------------+---------+-----------+----------+--------------+ FV Prox  Full                                                        +---------+---------------+---------+-----------+----------+--------------+  FV Mid   Full                                                        +---------+---------------+---------+-----------+----------+--------------+ FV DistalFull                                                         +---------+---------------+---------+-----------+----------+--------------+ PFV      Full                                                        +---------+---------------+---------+-----------+----------+--------------+ POP      Full           Yes      Yes                                 +---------+---------------+---------+-----------+----------+--------------+ PTV      Full                                                        +---------+---------------+---------+-----------+----------+--------------+ PERO     Full                                                        +---------+---------------+---------+-----------+----------+--------------+   +---------+---------------+---------+-----------+----------+-------------------+ LEFT     CompressibilityPhasicitySpontaneityPropertiesThrombus Aging      +---------+---------------+---------+-----------+----------+-------------------+ CFV      Full           Yes      Yes                                      +---------+---------------+---------+-----------+----------+-------------------+ SFJ      Full                                                             +---------+---------------+---------+-----------+----------+-------------------+ FV Prox  Full                                                             +---------+---------------+---------+-----------+----------+-------------------+ FV Mid   Partial        No       No  Age Indeterminate   +---------+---------------+---------+-----------+----------+-------------------+ FV DistalPartial        Yes      Yes                  Age Indeterminate   +---------+---------------+---------+-----------+----------+-------------------+ PFV      Full                                                             +---------+---------------+---------+-----------+----------+-------------------+ POP      Partial        No       No                    Age Indeterminate   +---------+---------------+---------+-----------+----------+-------------------+ PTV      Full                                                             +---------+---------------+---------+-----------+----------+-------------------+ PERO                                                  Not well visualized +---------+---------------+---------+-----------+----------+-------------------+ Gastroc  Full                                                             +---------+---------------+---------+-----------+----------+-------------------+    Summary: RIGHT: - There is no evidence of deep vein thrombosis in the lower extremity.  - No cystic structure found in the popliteal fossa.  LEFT: - Findings consistent with age indeterminate deep vein thrombosis involving the left femoral vein, and left popliteal vein. - No cystic structure found in the popliteal fossa.  *See table(s) above for measurements and observations. Electronically signed by Fabienne Bruns MD on 05/08/2020 at 4:26:58 PM.    Final     Labs:  Basic Metabolic Panel: No results for input(s): NA, K, CL, CO2, GLUCOSE, BUN, CREATININE, CALCIUM, MG, PHOS in the last 168 hours.  CBC: No results for input(s): WBC, NEUTROABS, HGB, HCT, MCV, PLT in the last 168 hours.  CBG: No results for input(s): GLUCAP in the last 168 hours.  Family history.  Positive for hypertension and hyperlipidemia.  Denies any colon cancer esophageal cancer or rectal cancer  Brief HPI:   Timothy Ware is a 86 y.o. right-handed male with history of hypertension, CAD with angioplasty, CKD stage III as well as chronic back pain with multiple back surgeries status post radiculopathy status post transpedicular microdiscectomy T10-11 early December 2021 and received inpatient rehab services 02/28/2020 to 03/22/2020.  Per chart review patient lives with spouse.  Patient reported transferring bed to wheelchair drop arm bedside  commode only very limited ambulation.  Needed assist for ADLs.  Prior to initial back surgery in December reportedly independent.  Two-level home bed and bath main level.  One-step  to entry.  Son and wife with good support.  Presented 04/25/2020 with increasing right lower extremity weakness and numbness.  Latest chemistries showed a hemoglobin 11.6 hematocrit 36.9.  X-rays imaging showed thoracic spinal stenosis with recurrent disc herniation with spinal instability.  Underwent right transpedicular decompression microdiscectomy T10-11 with pedicle screw fixation.  Thoracic 10-12 with posterior lateral arthrodesis 05/02/2020 per Dr. Venetia Maxon.  Placed on Lovenox for DVT prophylaxis.  Back brace applied when out of bed in sitting position.  Therapy evaluations completed due to patient decreased functional mobility was admitted for a comprehensive rehab program.   Hospital Course: Timothy Ware was admitted to rehab 05/06/2020 for inpatient therapies to consist of PT, ST and OT at least three hours five days a week. Past admission physiatrist, therapy team and rehab RN have worked together to provide customized collaborative inpatient rehab.  Pertaining to patient's thoracic stenosis with myelopathy recurrent disc herniation paraparesis.  Status post right transpedicular decompression microdiscectomy T10-11 with pedicle screw fixation T10-12 posterior lateral arthrodesis 05/02/2020.  Back brace when out of bed.  Patient would follow-up neurosurgery.  Back incision clean and dry.  Hospital course findings of left femoral-popliteal DVT neurosurgery consulted and patient placed on Xarelto.  He had been on subcutaneous Lovenox for DVT prophylaxis.  Pain managed with the use of scheduled baclofen 10 mg qhs  Neurontin 300 mg nightly hydrocodone and Robaxin for breakthrough pain and muscle spasms.  Lipitor ongoing for hyperlipidemia.  Blood pressure somewhat soft his Tenormin was discontinued and monitored.  Neurogenic bowel bladder  Flomax nightly PVRs unremarkable.  CKD stage III creatinine 1.27 baseline latest creatinine 1.24.  Acute blood loss anemia hemoglobin 9.7 with occult blood negative.   Blood pressures were monitored on TID basis and soft and monitored     Rehab course: During patient's stay in rehab weekly team conferences were held to monitor patient's progress, set goals and discuss barriers to discharge. At admission, patient required moderate assist side-lying to sitting moderate assist sit to side-lying max assist squat pivot transfers.  Moderate assist upper body bathing max is lower body bathing max is upper body dressing max is lower body dressing +2 physical assist toilet transfers.  Physical exam.  Blood pressure 134/51 pulse 72 temperature 97.8 respirations 15 oxygen saturations 100% room air Constitutional.  No acute distress HEENT Head.  Normocephalic and atraumatic Eyes.  Pupils round and reactive to light no discharge without nystagmus Neck.  Supple nontender no JVD without thyromegaly Cardiac regular rate and rhythm without extra sounds or murmur heard Abdomen.  Soft nontender positive bowel sounds without rebound Respiratory effort normal no respiratory distress without wheeze Skin.  Honeycomb dressing was initially in place to back incision Neurologic.  Alert no acute distress oriented x3 follows commands.  Decreased sensation below the waist.  Right lower extremity affected more than left.  Decreased rectal sensation.  Upper extremity motor 5/5.  Left lower extremity 4 - proximal to 4/5 distally.  Right lower extremity 2+ hip flexors, knee extension and 3/5 ankle dorsi plantarflexion.  DTRs 1+  He/She  has had improvement in activity tolerance, balance, postural control as well as ability to compensate for deficits. He/She has had improvement in functional use RUE/LUE  and RLE/LLE as well as improvement in awareness.  Sessions with emphasis on functional mobility transfers generalized  strengthening dynamic standing balance coordination.  Patient transferred supine to sitting edge of bed with supervision and use of bed rails and donned TLSO with set up assist in shoes  with total assist.  Squat pivot bed to wheelchair close supervision and perform wheelchair mobility 125 feet supervision.  Patient able to set up transfer to mat modified independent but required supervision for squat pivot transfers to mat.  Patient transferred sit to stand rolling walker minimal assist x2.  Required supervision upper body bathing minimal assist lower body bathing supervision upper body dressing minimal assist lower body dressing.  Full family teaching completed plan discharge to home       Disposition: Discharge to home    Diet: Regular  Special Instructions: No driving smoking or alcohol  Back brace when out of bed  Medications at discharge 1.  Tylenol as needed 2.  Lipitor 20 mg p.o. daily 3.  B complex with vitamin C tablet daily 4.  Baclofen 10 mg QHS 5.  Dulcolax suppository daily as needed 6.  Vitamin D3 2000 units daily 7.  Neurontin 300 mg p.o. nightly 8.  Hydrocodone 1 to 2 tablets every 4 hours as needed pain 9.  Melatonin 3 mg nightly 10.  Magnesium oxide 400 mg p.o. daily 11.  Multivitamin daily 12.  Protonix 40 mg nightly 13.  MiraLAX twice daily hold for loose stools 14.  Xarelto 20 mg daily with supper 15.  Flomax 0.4 mg daily after supper 16.  Senokot S2 tablets p.o. twice daily  30-35 minutes were spent completing discharge summary and discharge planning  Discharge Instructions     Ambulatory referral to Physical Medicine Rehab   Complete by: As directed    Moderate complexity follow-up 1 to 2 weeks thoracic myelopathy        Follow-up Information     Marcello Fennel, MD Follow up.   Specialty: Physical Medicine and Rehabilitation Why: Office to call for appointment Contact information: [address] [place]  [address] [phone]         Maeola Harman, MD Follow up.   Specialty: Neurosurgery Why: Call for appointment Contact information: [address] [place] [address] [phone]                 Signed: Charlton Amor 05/31/2020, 5:06 AM Patient was seen, face-face, and physical exam performed by me on day of discharge, greater than 30 minutes of total time spent.. Please see progress note from day of discharge as well.  Maryla Morrow, MD, ABPMR

## 2020-05-29 NOTE — Progress Notes (Signed)
PROGRESS NOTE   Subjective/Complaints: Patient seen laying in bed this morning.  He states he slept well overnight.  He notes he had some incontinence 2 nights ago, but denies complaints since then.  ROS: Denies CP, SOB, N/V/D  Objective:   No results found. No results for input(s): WBC, HGB, HCT, PLT in the last 72 hours. No results for input(s): NA, K, CL, CO2, GLUCOSE, BUN, CREATININE, CALCIUM in the last 72 hours.  Intake/Output Summary (Last 24 hours) at 05/29/2020 1251 Last data filed at 05/29/2020 1211 Gross per 24 hour  Intake 720 ml  Output 1550 ml  Net -830 ml        Physical Exam: Vital Signs Blood pressure 133/71, pulse 78, temperature (P) 98.4 F (36.9 C), temperature source (P) Oral, resp. rate 14, height 6\' 1"  (1.854 m), weight 80.5 kg, SpO2 98 %.  Constitutional: No distress . Vital signs reviewed. HENT: Normocephalic.  Atraumatic. Eyes: EOMI. No discharge. Cardiovascular: No JVD.  RRR. Respiratory: Normal effort.  No stridor.  Bilateral clear to auscultation. GI: Non-distended.  BS +. Skin: Warm and dry.  Intact. Psych: Normal mood.  Normal behavior. Musc: No edema in extremities.  No tenderness in extremities. Neuro: Alert Motor: B/l UE: 5/5 proximal to distal LLE: 4+/5 proximal to distal, stable RLE: HF and KE 3-/5 (pain component too), ADF 4+/5, stable Sensation remains diminished to light touch RLE, prox more than distal  Assessment/Plan: 1. Functional deficits which require 3+ hours per day of interdisciplinary therapy in a comprehensive inpatient rehab setting.  Physiatrist is providing close team supervision and 24 hour management of active medical problems listed below.  Physiatrist and rehab team continue to assess barriers to discharge/monitor patient progress toward functional and medical goals  Care Tool:  Bathing  Bathing activity did not occur: Refused Body parts bathed by  patient: Right arm,Left arm,Chest,Abdomen,Front perineal area,Buttocks,Right upper leg,Left upper leg,Left lower leg   Body parts bathed by helper: Right lower leg     Bathing assist Assist Level: Minimal Assistance - Patient > 75% (bed level)     Upper Body Dressing/Undressing Upper body dressing Upper body dressing/undressing activity did not occur (including orthotics): Refused What is the patient wearing?: Pull over shirt,Button up shirt Orthosis activity level: Performed by helper  Upper body assist Assist Level: Set up assist    Lower Body Dressing/Undressing Lower body dressing    Lower body dressing activity did not occur: Refused What is the patient wearing?: Pants     Lower body assist Assist for lower body dressing: Minimal Assistance - Patient > 75%     Toileting Toileting Toileting Activity did not occur (Clothing management and hygiene only): N/A (no void or bm)  Toileting assist Assist for toileting: Dependent - Patient 0% (using Stedy)     Transfers Chair/bed transfer  Transfers assist  Chair/bed transfer activity did not occur: Refused  Chair/bed transfer assist level: Supervision/Verbal cueing     Locomotion Ambulation   Ambulation assist   Ambulation activity did not occur: Safety/medical concerns (pain and unable to stand)  Assist level: Moderate Assistance - Patient 50 - 74% Assistive device: Parallel bars Max distance: 8'   Walk  10 feet activity   Assist  Walk 10 feet activity did not occur: Safety/medical concerns        Walk 50 feet activity   Assist Walk 50 feet with 2 turns activity did not occur: Safety/medical concerns         Walk 150 feet activity   Assist Walk 150 feet activity did not occur: Safety/medical concerns         Walk 10 feet on uneven surface  activity   Assist Walk 10 feet on uneven surfaces activity did not occur: Safety/medical concerns         Wheelchair     Assist Will patient  use wheelchair at discharge?: Yes Type of Wheelchair: Manual    Wheelchair assist level: Supervision/Verbal cueing Max wheelchair distance: >573ft    Wheelchair 50 feet with 2 turns activity    Assist        Assist Level: Supervision/Verbal cueing   Wheelchair 150 feet activity     Assist      Assist Level: Supervision/Verbal cueing   Medical Problem List and Plan: 1.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 Status post right transpedicular decompression microdiscectomy T10-11 with pedicle screw fixation T10-12 with posterior lateral arthrodesis 05/02/2020. Back brace when out of bed  Continue CIR  PRAFO nightly 2. Antithrombotics: -DVT/anticoagulation: Continue Xarelto  Left femoral and popliteal DVT -antiplatelet therapy: N/A 3. Pain Management:Continue hydrocodone as needed. Robaxin as needed  Gabapentin 300 nightly started on 2/17  Controlled with meds on 3/7  Increased baclofen to 15mg .  Add 10mg  q 8am an dq 1pm , monitor for sedation   Monitor with increased mobility 4. Mood:Provide emotional support -antipsychotic agents: N/A 5. Neuropsych: This patientiscapable of making decisions on hisown behalf. 6. Skin/Wound Care: Routine skin care  7. Fluids/Electrolytes/Nutrition:Routine in and outs 8. Hypertension.     Tenormin decreased to 12.5mg  DC'd on 3/2  Controlled on 3/2  Monitor with increased mobility 9. Neurogenic bowel andbladder.   PVRs relatively unremarkable  Flomax 0.4 mg nightly  Improved 10. Hyperlipidemia. Continue Lipitor 11. CKD stage III. Creatinine baseline 1.27.   Creatinine 1.24 on 2/28 12. Nausea:?  Resolved 13.  Sleep disturbance  Melatonin started on 2/16  ECG reviewed, Trazodone 25qhs d/ced as patient urinated on himself at night- could not wake to urinate 14.  Acute blood loss anemia  Hemoglobin 9.7 on 2/20  Hemoccult negative    LOS: 23 days A FACE TO FACE EVALUATION WAS PERFORMED  Evee Liska 3/16 05/29/2020, 12:51 PM

## 2020-05-29 NOTE — Progress Notes (Signed)
Physical Therapy Session Note  Patient Details  Name: Timothy Ware MRN: [id number] Date of Birth: 15-Jan-1935  Today's Date: 05/29/2020 PT Individual Time: 9678-9381 and 1445-1526 PT Individual Time Calculation (min): 13 min  and 41 min Today's Date: 05/29/2020 PT Missed Time: 63 Minutes Missed Time Reason: Patient ill (Comment) (queasy)  Short Term Goals: Week 2:  PT Short Term Goal 1 (Week 2): pt will perform bed mobility with CGA overall PT Short Term Goal 1 - Progress (Week 2): Progressing toward goal PT Short Term Goal 2 (Week 2): pt will be able to tolerate OOB in chair x 2 hours a day PT Short Term Goal 2 - Progress (Week 2): Met PT Short Term Goal 3 (Week 2): pt will verbalize and demonstrate understanding of back precautions PT Short Term Goal 3 - Progress (Week 2): Met Week 3:  PT Short Term Goal 1 (Week 3): STG=LTG due to LOS  Skilled Therapeutic Interventions/Progress Updates:   Treatment Session 1: 1045-1058 13 min Received pt sidelying in bed asleep, upon wakening pt reported feeling sleepy and exhausted stating "I'm not worth a hoot" and then closing eyes. Pt reported looking forward to PT session but reported feeling too queasy and too tired to participate. Therapist provided pt with diet ginger ale and crackers (pt able to eat 2 crackers) and pt appreciative. Concluded session with pt sidelying in bed with all needs within reach. RN aware of pt's current status. 63 minutes missed of skilled physical therapy due to fatigue and nausea.   Treatment Session 2: 1445-1526 41 min Received pt supine in bed, pt agreeable to therapy, and did not state pain level during session. Pt reported feeling exhausted but no longer queasy like he was this morning. Pt reported feeling better after lunchtime and then using the restroom but upon returning to bed from restroom stated "I could barely hold onto the [name]". Pt agreeable to attempt OOB mobility and transferred supine<>sitting EOB with  supervision and donned TLSO with set up assist and shoes with total A. Pt performed multiple lateral scoots from bed<>WC with CGA. Performed WC mobility 275f x 1, 1035fx1, and >35071f 1 through [employer] supervision. Pt performed the following exercises sitting in WC with supervision and verbal cues for technique: -LAQ 2x15 on LLE and 2x10 on RLE (with decreased ROM) -hip flexion 2x15 on LLE and x5 on RLE (pt unable to lift LE fully against gravity) At end of session pt stated "I feel better now than I did when you first came in" and agreeable to stay sitting in WC. Concluded session with pt sitting in WC, needs within reach, and chair pad alarm on.   Therapy Documentation Precautions:  Precautions Precautions: Fall,Back Precaution Comments: Back precautions, TLSO EOB Required Braces or Orthoses: Spinal Brace Spinal Brace: Thoracolumbosacral orthotic,Applied in sitting position Restrictions Weight Bearing Restrictions: No  Therapy/Group: Individual Therapy AnnAlfonse Alpers, DPT   05/29/2020, 7:32 AM

## 2020-05-30 ENCOUNTER — Other Ambulatory Visit: Payer: Self-pay | Admitting: Physician Assistant

## 2020-05-30 MED ORDER — MELATONIN 3 MG PO TABS: 3.0000 mg | ORAL_TABLET | Freq: Every day | ORAL | 0 refills | Status: AC

## 2020-05-30 MED ORDER — PANTOPRAZOLE SODIUM 40 MG PO TBEC: 40.0000 mg | DELAYED_RELEASE_TABLET | Freq: Every day | ORAL | 0 refills | Status: AC

## 2020-05-30 MED ORDER — BACLOFEN 10 MG PO TABS: 10.0000 mg | ORAL_TABLET | Freq: Two times a day (BID) | ORAL | 0 refills | Status: DC

## 2020-05-30 MED ORDER — METHOCARBAMOL 500 MG PO TABS
500.0000 mg | ORAL_TABLET | Freq: Four times a day (QID) | ORAL | 0 refills | Status: AC | PRN
Start: 2020-05-30 — End: ?

## 2020-05-30 MED ORDER — HYDROCODONE-ACETAMINOPHEN 5-325 MG PO TABS: 1.0000 | ORAL_TABLET | ORAL | 0 refills | Status: AC | PRN

## 2020-05-30 MED ORDER — BACLOFEN 10 MG PO TABS: 10.0000 mg | ORAL_TABLET | Freq: Every day | ORAL | 0 refills | Status: AC

## 2020-05-30 MED ORDER — BACLOFEN 10 MG PO TABS: ORAL_TABLET | ORAL | 0 refills | Status: DC

## 2020-05-30 MED ORDER — MAGNESIUM OXIDE 400 MG PO TABS: 400.0000 mg | ORAL_TABLET | Freq: Every day | ORAL | 0 refills | Status: AC

## 2020-05-30 MED ORDER — VITAMIN D 50 MCG (2000 UT) PO TABS: 2000.0000 [IU] | ORAL_TABLET | Freq: Every day | ORAL | 0 refills | Status: AC

## 2020-05-30 MED ORDER — BACLOFEN 10 MG PO TABS
10.0000 mg | ORAL_TABLET | Freq: Every day | ORAL | Status: DC
  Administered 2020-05-30: 10 mg via ORAL
  Filled 2020-05-30: qty 1

## 2020-05-30 MED ORDER — ATORVASTATIN CALCIUM 40 MG PO TABS: 20.0000 mg | ORAL_TABLET | Freq: Every day | ORAL | 0 refills | Status: AC

## 2020-05-30 MED ORDER — RIVAROXABAN 20 MG PO TABS: 20.0000 mg | ORAL_TABLET | Freq: Every day | ORAL | 0 refills | Status: AC

## 2020-05-30 MED ORDER — GABAPENTIN 600 MG PO TABS: 300.0000 mg | ORAL_TABLET | Freq: Every day | ORAL | 0 refills | Status: AC

## 2020-05-30 MED ORDER — TAMSULOSIN HCL 0.4 MG PO CAPS: 0.4000 mg | ORAL_CAPSULE | Freq: Every day | ORAL | 0 refills | Status: AC

## 2020-05-30 MED ORDER — B COMPLEX-C PO TABS: 1.0000 | ORAL_TABLET | Freq: Every day | ORAL | 0 refills | Status: AC

## 2020-05-30 MED FILL — MAGNESIUM OXIDE 400 MG TABS: 400 | 30 days supply | Qty: 30 | Fill #0

## 2020-05-30 MED FILL — B COMPLEX TABLET: 30 days supply | Qty: 30 | Fill #0

## 2020-05-30 MED FILL — GABAPENTIN 600 MG TABLET: 600 | 30 days supply | Qty: 30 | Fill #0

## 2020-05-30 MED FILL — MELATONIN 3 MG TABS: 3 | 30 days supply | Qty: 30 | Fill #0

## 2020-05-30 MED FILL — PANTOPRAZOLE SOD DR 40 MG T: 40 | 30 days supply | Qty: 30 | Fill #0

## 2020-05-30 MED FILL — ATORVASTATIN CALCIUM 40 MG: 40 | 60 days supply | Qty: 30 | Fill #0

## 2020-05-30 MED FILL — XARELTO 20 MG TABLET: 20 | 30 days supply | Qty: 30 | Fill #0

## 2020-05-30 MED FILL — METHOCARBAMOL 500 MG TABS: 500 | 15 days supply | Qty: 60 | Fill #0

## 2020-05-30 MED FILL — VITAMIN B6 50 MG TABS: 50 | 30 days supply | Qty: 30 | Fill #0

## 2020-05-30 MED FILL — TAMSULOSIN HCL 0.4 MG CAP: 0.4 | 30 days supply | Qty: 30 | Fill #0

## 2020-05-30 MED FILL — HYDROCODON-APAP 5-325: 5-325 | 3 days supply | Qty: 30 | Fill #0

## 2020-05-30 MED FILL — BACLOFEN 10 MG TABS: 10 | 30 days supply | Qty: 30 | Fill #0

## 2020-05-30 NOTE — Progress Notes (Signed)
Inpatient Rehabilitation Care Coordinator Discharge Note  The overall goal for the admission was met for:   Discharge location: [place] TO ASSIST  Length of Stay: Yes-25 DAYS  Discharge activity level: Yes-SUPERVISION-CGA WHEELCHAIR LEVEL  Home/community participation: Yes  Services provided included: MD, RD, PT, OT, RN, CM, TR, Pharmacy, Neuropsych and SW  Financial Services: Medicare and Private Insurance: Museum/gallery exhibitions officer offered to/list presented to:YES  Follow-up services arranged: Home Health: [place] and Patient/Family request agency HH: [facility], DME: NO NEEDS HAS EQUIPMENT FROM PREVIOUS ADMIT IN 02/2020  Comments (or additional information):PT GOT BACK TO THE LEVEL HE WAS WHEN HE LEFT REHAB IN 02/2020 SUPERVISION WHEELCHAIR LEVEL. WIFE CAN MANAGE AT THIS LEVEL DUE TO HER OWN BACK ISSUES  Patient/Family verbalized understanding of follow-up arrangements: Yes  Individual responsible for coordination of the follow-up plan: LINDA-WIFE [phone]  Confirmed correct DME delivered: Elease Hashimoto 05/30/2020    Jlyn Bracamonte, [place]

## 2020-05-30 NOTE — Progress Notes (Signed)
At 2307pm Pt had a large BM. After getting up from toilet pt stated, " I feel faint." Tech took vitals. Vitals documented and stable. Once in bed pt stated that he felt a lot better.RN notified Jesusita Oka, [place] of the episode. RN will continue to monitor.

## 2020-05-30 NOTE — Progress Notes (Signed)
Physical Therapy Session Note  Patient Details  Name: Timothy Ware MRN: [id number] Date of Birth: July 15, 1934  Today's Date: 05/30/2020 PT Individual Time: 1015-1057 PT Individual Time Calculation (min): 42 min   Short Term Goals: Week 3:  PT Short Term Goal 1 (Week 3): STG=LTG due to LOS  Skilled Therapeutic Interventions/Progress Updates:    Pt received sitting upright in w/c - attempting to use urinal but having trouble producing urine. Provided him a few minutes for further efforts but ultimately unable. Pt propelled himself in w/c from his room to day room gym on 4th floor, >525ft, mod I. Demo's appropriate stroke efficiency with BUE's - offered w/c gloves but patient defer's, stating "you only need gloves if your hands are tender." Friendly reminder the importance of perserving skin integrity as well as assisting with w/c propulsion. Also navigated a ~53ft ramp while in his w/c, completing with supervision and needing min cues for maintaining safe speed during descent. Completed ~12 minutes of Kinetron at 30cm/sec resistance, able to maintain his R knee in proper alignment for ~35% of the time and this worsened with fatigue and distractions. He also needed cues for completing stepping pattern all the way through to promote hip extension. He propelled himself back to his room mod I in w/c, >579ft, and remained seated in his w/c with needs in reach. Education on general home safety, role of f/u therapies - all questions addressed.   Therapy Documentation Precautions:  Precautions Precautions: Fall,Back Precaution Comments: Back precautions, TLSO EOB Required Braces or Orthoses: Spinal Brace Spinal Brace: Thoracolumbosacral orthotic,Applied in sitting position Restrictions Weight Bearing Restrictions: No  Therapy/Group: Individual Therapy  Jenie Parish P Dima Mini PT 05/30/2020, 6:37 AM

## 2020-05-30 NOTE — Progress Notes (Signed)
Occupational Therapy Discharge Summary  Patient Details  Name: Timothy Ware MRN: [id number] Date of Birth: 05-13-1934   Patient has met 6 of 6 long term goals due to improved activity tolerance, improved balance and ability to compensate for deficits.  Patient to discharge at overall Supervision level.  Patient's care partner is independent to provide the necessary physical assistance at discharge.  Patient's wife was unable to come in for hands on family education, however pt to d/c home at similar level to previous d/c from CIR and pt reports wife able to assist with clothing management when he completes boosting or to assist at bed level.  Reasons goals not met: N/A  Recommendation:  Patient will benefit from ongoing skilled OT services in home health setting to continue to advance functional skills in the area of BADL and Reduce care partner burden.  Equipment: No equipment provided  Reasons for discharge: treatment goals met and discharge from hospital  Patient/family agrees with progress made and goals achieved: Yes  OT Discharge Precautions/Restrictions  Precautions Precautions: Fall;Back Precaution Comments: Back precautions, TLSO when OOB Required Braces or Orthoses: Other Brace Spinal Brace: Thoracolumbosacral orthotic;Applied in sitting position Restrictions Weight Bearing Restrictions: No Pain Pain Assessment Pain Scale: 0-10 Pain Score: 0-No pain ADL ADL Eating: Independent Grooming: Modified independent Where Assessed-Grooming: Edge of bed Upper Body Bathing: Supervision/safety Where Assessed-Upper Body Bathing: Edge of bed Lower Body Bathing: Minimal assistance Where Assessed-Lower Body Bathing: Bed level Upper Body Dressing: Supervision/safety Where Assessed-Upper Body Dressing: Edge of bed Lower Body Dressing: Contact guard Where Assessed-Lower Body Dressing: Bed level Toileting: Minimal assistance Where Assessed-Toileting: Bedside Commode Toilet  Transfer: Close supervision Toilet Transfer Method: Engineer, water: Drop arm bedside commode Tub/Shower Transfer: Unable to assess Tub/Shower Transfer Method: Unable to assess Vision Baseline Vision/History: No visual deficits Patient Visual Report: No change from baseline Vision Assessment?: No apparent visual deficits Perception  Perception: Within Functional Limits Praxis Praxis: Intact Cognition Overall Cognitive Status: Within Functional Limits for tasks assessed Arousal/Alertness: Awake/alert Orientation Level: Oriented X4 Selective Attention: Appears intact Memory: Appears intact Awareness: Appears intact Problem Solving: Appears intact Safety/Judgment: Appears intact Sensation Sensation Light Touch: Impaired Detail Peripheral sensation comments: WFL on LLE. Absent sensation at R L1-2, diminished at R L3 and R lateral malleoli, absent at R great toe and medial malleoli Proprioception: Impaired by gross assessment Coordination Gross Motor Movements are Fluid and Coordinated: No Fine Motor Movements are Fluid and Coordinated: Yes Coordination and Movement Description: grossly uncoordinated due to BLE weakness, paraplegia, decreased balance/postural control and decreased endurance Finger Nose Finger Test: [facility] bilaterally Heel Shin Test: WFL on LLE, decreased ROM on RLE Motor    Mobility  Bed Mobility Bed Mobility: Rolling Right;Rolling Left;Sit to Supine;Supine to Sit Rolling Right: Independent with assistive device Rolling Left: Independent with assistive device Supine to Sit: Independent with assistive device Sit to Supine: Minimal Assistance - Patient > 75% Transfers Sit to Stand: Minimal Assistance - Patient > 75% Stand to Sit: Minimal Assistance - Patient > 75%  Trunk/Postural Assessment  Cervical Assessment Cervical Assessment: Within Functional Limits Thoracic Assessment Thoracic Assessment: Exceptions to WFL (TLSO; back  precautions) Lumbar Assessment Lumbar Assessment: Exceptions to [facility] (posterior pelvic tilt) Postural Control Postural Control: Deficits on evaluation  Balance Balance Balance Assessed: Yes Static Sitting Balance Static Sitting - Balance Support: Feet supported;Bilateral upper extremity supported Static Sitting - Level of Assistance: 7: Independent Dynamic Sitting Balance Dynamic Sitting - Balance Support: Feet supported;No upper extremity supported Dynamic Sitting -  Level of Assistance: 6: Modified independent (Device/Increase time) Static Standing Balance Static Standing - Balance Support: Bilateral upper extremity supported (RW) Static Standing - Level of Assistance: 5: Stand by assistance (CGA) Dynamic Standing Balance Dynamic Standing - Balance Support: Bilateral upper extremity supported (RW) Dynamic Standing - Level of Assistance: 4: Min assist Dynamic Standing - Comments: with stand<>pivot transfers Extremity/Trunk Assessment RUE Assessment RUE Assessment: Within Functional Limits LUE Assessment LUE Assessment: Exceptions to [facility] General Strength Comments: Hx of rotator cuff injury, flexion limited to 80 degrees.  Able to get full ROM with increased time   Timothy Ware, [facility] 05/30/2020, 11:33 AM

## 2020-05-30 NOTE — Progress Notes (Signed)
Physical Therapy Discharge Summary  Patient Details  Name: Timothy Ware MRN: [id number] Date of Birth: 01-Nov-1934  Patient has met 6 of 8 long term goals due to improved activity tolerance, improved balance, improved postural control, increased strength, decreased pain, improved awareness and improved coordination. Patient to discharge at a wheelchair level Supervision.  Patient's care partner is independent to provide the necessary physical assistance at discharge. Pt's wife did not attend family education training, however pt is familiar with safety techniques with transfers from previous CIR stay and verbalized and demonstrated confidence with transfers.   Reasons goals not met: Pt did not fully meet bed mobility goal of mod I. Pt is mod I for rolling L/R and transferring supine<>sit, however requires min A for RLE upon transferring sit<>supine in order to maintain spinal precautions. Pt also did not meet car transfer goal of CGA. Pt performed 1 simulated car transfer with min A via squat<>pivot technique but since, pt has politely declined practicing as simulated car is "nothing like his car" and pt reports he and his son have their own technique they use to get in/out of the car that a home health PT taught them.  Recommendation:  Patient will benefit from ongoing skilled PT services in home health setting to continue to advance safe functional mobility, address ongoing impairments in transfers, generalized strengthening, dynamic standing balance/coordination, gait training, endurance, and to minimize fall risk.  Equipment: No equipment provided; pt already has all equipment from previous stay   Reasons for discharge: treatment goals met and discharge from hospital  Patient/family agrees with progress made and goals achieved: Yes  PT Discharge Precautions/Restrictions Precautions Precautions: Fall;Back Precaution Comments: Back precautions, TLSO when OOB Required Braces or Orthoses:  Other Brace Spinal Brace: Thoracolumbosacral orthotic;Applied in sitting position Restrictions Weight Bearing Restrictions: No Cognition Overall Cognitive Status: Within Functional Limits for tasks assessed Arousal/Alertness: Awake/alert Orientation Level: Oriented X4 Memory: Appears intact Awareness: Appears intact Problem Solving: Appears intact Safety/Judgment: Appears intact Sensation Sensation Light Touch: Impaired Detail Peripheral sensation comments: WFL on LLE. Absent sensation at R L1-2, diminished at R L3 and R lateral malleoli, absent at R great toe and medial malleoli Proprioception: Impaired by gross assessment Coordination Gross Motor Movements are Fluid and Coordinated: No Fine Motor Movements are Fluid and Coordinated: Yes Coordination and Movement Description: grossly uncoordinated due to BLE weakness, paraplegia, decreased balance/postural control and decreased endurance Finger Nose Finger Test: [facility] bilaterally Heel Shin Test: WFL on LLE, decreased ROM on RLE Motor  Motor Motor: Paraplegia;Abnormal postural alignment and control Motor - Skilled Clinical Observations: grossly uncoordinated due to paraplegia, RLE weakness, low back pain, decreased balance/postural control, and decreased endurance  Mobility Bed Mobility Bed Mobility: Rolling Right;Rolling Left;Sit to Supine;Supine to Sit Rolling Right: Independent with assistive device Rolling Left: Independent with assistive device Supine to Sit: Independent with assistive device Sit to Supine: Minimal Assistance - Patient > 75% Transfers Transfers: Sit to Stand;Stand to Sit;Stand Pivot Transfers;Squat Pivot Transfers;Lateral/Scoot Transfers Sit to Stand: Minimal Assistance - Patient > 75% Stand to Sit: Minimal Assistance - Patient > 75% Stand Pivot Transfers: Minimal Assistance - Patient > 75% Stand Pivot Transfer Details: Verbal cues for safe use of DME/AE;Verbal cues for technique Stand Pivot Transfer  Details (indicate cue type and reason): verbal cues for RW and RLE management Squat Pivot Transfers: Supervision/Verbal cueing Lateral/Scoot Transfers: Supervision/Verbal cueing Transfer (Assistive device): Rolling walker Locomotion  Gait Ambulation: Yes Gait Assistance: Moderate Assistance - Patient 50-74% Gait Distance (Feet): 8 Feet Assistive device:  Parallel bars Gait Assistance Details: Manual facilitation for weight shifting;Manual facilitation for placement;Verbal cues for technique Gait Assistance Details: total A to advance RLE, guarding R knee in case of buckling, verbal cues for stepping sequence Gait Gait: Yes Gait Pattern: Impaired Gait Pattern: Decreased stance time - right;Decreased dorsiflexion - right;Decreased stride length;Decreased weight shift to right;Decreased step length - right;Decreased step length - left;Decreased hip/knee flexion - right;Poor foot clearance - right;Poor foot clearance - left Gait velocity: significantly decreased Stairs / Additional Locomotion Stairs: No Wheelchair Mobility Wheelchair Mobility: Yes Wheelchair Assistance: Independent with Camera operator: Both upper extremities Wheelchair Parts Management: Independent Distance: >164f  Trunk/Postural Assessment  Cervical Assessment Cervical Assessment: Within Functional Limits Thoracic Assessment Thoracic Assessment: Exceptions to WFL (TLSO; back precautions) Lumbar Assessment Lumbar Assessment: Exceptions to [facility]) Postural Control Postural Control: Deficits on evaluation  Balance Balance Balance Assessed: Yes Static Sitting Balance Static Sitting - Balance Support: Feet supported;Bilateral upper extremity supported Static Sitting - Level of Assistance: 7: Independent Dynamic Sitting Balance Dynamic Sitting - Balance Support: Feet supported;No upper extremity supported Dynamic Sitting - Level of Assistance: 6: Modified independent  (Device/Increase time) Static Standing Balance Static Standing - Balance Support: Bilateral upper extremity supported (RW) Static Standing - Level of Assistance: 5: Stand by assistance (CGA) Dynamic Standing Balance Dynamic Standing - Balance Support: Bilateral upper extremity supported (RW) Dynamic Standing - Level of Assistance: 4: Min assist Dynamic Standing - Comments: with stand<>pivot transfers Extremity Assessment  RLE Assessment RLE Assessment: Exceptions to [facility] Strength Right Hip Flexion: 2+/5 Right Hip ABduction: 4-/5 Right Hip ADduction: 3+/5 Right Knee Flexion: 2+/5 Right Knee Extension: 3-/5 Right Ankle Dorsiflexion: 3/5 Right Ankle Plantar Flexion: 3+/5 LLE Assessment LLE Assessment: Exceptions to [facility] Hospital[facility] Strength Comments: grossly generalized to 4/5  AAlfonse AlpersPT, DPT  05/30/2020, 7:44 AM

## 2020-05-30 NOTE — Progress Notes (Signed)
PROGRESS NOTE   Subjective/Complaints: Patient seen laying in bed this morning.  He states he slept well overnight.  He notes he had severe fatigue yesterday after using the restroom on 2 occasions.  He was also noted to be fatigued yesterday evening, discussed baclofen with nursing.  ROS: Denies CP, SOB, N/V/D  Objective:   No results found. No results for input(s): WBC, HGB, HCT, PLT in the last 72 hours. No results for input(s): NA, K, CL, CO2, GLUCOSE, BUN, CREATININE, CALCIUM in the last 72 hours.  Intake/Output Summary (Last 24 hours) at 05/30/2020 1053 Last data filed at 05/30/2020 0937 Gross per 24 hour  Intake 720 ml  Output 1650 ml  Net -930 ml        Physical Exam: Vital Signs Blood pressure (!) 102/51, pulse 83, temperature 98.5 F (36.9 C), temperature source Oral, resp. rate 16, height 6\' 1"  (1.854 m), weight 80.5 kg, SpO2 94 %.  Constitutional: No distress . Vital signs reviewed. HENT: Normocephalic.  Atraumatic. Eyes: EOMI. No discharge. Cardiovascular: No JVD.  RRR. Respiratory: Normal effort.  No stridor.  Bilateral clear to auscultation. GI: Non-distended.  BS +. Skin: Warm and dry.  Intact. Psych: Normal mood.  Normal behavior. Musc: No edema in extremities.  No tenderness in extremities. Neuro: Alert Motor: B/l UE: 5/5 proximal to distal LLE: 4+/5 proximal to distal, stable RLE: HF and KE 3-/5 (pain component too), ADF 4+/5, unchanged Sensation remains diminished to light touch RLE, prox more than distal  Assessment/Plan: 1. Functional deficits which require 3+ hours per day of interdisciplinary therapy in a comprehensive inpatient rehab setting.  Physiatrist is providing close team supervision and 24 hour management of active medical problems listed below.  Physiatrist and rehab team continue to assess barriers to discharge/monitor patient progress toward functional and medical goals  Care  Tool:  Bathing  Bathing activity did not occur: Refused Body parts bathed by patient: Right arm,Left arm,Chest,Abdomen,Front perineal area,Buttocks,Right upper leg,Left upper leg,Left lower leg   Body parts bathed by helper: Right lower leg     Bathing assist Assist Level: Minimal Assistance - Patient > 75% (bed level)     Upper Body Dressing/Undressing Upper body dressing Upper body dressing/undressing activity did not occur (including orthotics): Refused What is the patient wearing?: Pull over shirt,Button up shirt Orthosis activity level: Performed by helper  Upper body assist Assist Level: Set up assist    Lower Body Dressing/Undressing Lower body dressing    Lower body dressing activity did not occur: Refused What is the patient wearing?: Pants     Lower body assist Assist for lower body dressing: Minimal Assistance - Patient > 75%     Toileting Toileting Toileting Activity did not occur (Clothing management and hygiene only): N/A (no void or bm)  Toileting assist Assist for toileting: Dependent - Patient 0% (using Stedy)     Transfers Chair/bed transfer  Transfers assist  Chair/bed transfer activity did not occur: Refused  Chair/bed transfer assist level: Supervision/Verbal cueing     Locomotion Ambulation   Ambulation assist   Ambulation activity did not occur: Safety/medical concerns (pain and unable to stand)  Assist level: Moderate Assistance - Patient  50 - 74% Assistive device: Parallel bars Max distance: 27ft   Walk 10 feet activity   Assist  Walk 10 feet activity did not occur: Safety/medical concerns (fatigue, decreased balance, RLE weakness, decreased endurance)        Walk 50 feet activity   Assist Walk 50 feet with 2 turns activity did not occur: Safety/medical concerns (fatigue, decreased balance, RLE weakness, decreased endurance)         Walk 150 feet activity   Assist Walk 150 feet activity did not occur: Safety/medical  concerns (fatigue, decreased balance, RLE weakness, decreased endurance)         Walk 10 feet on uneven surface  activity   Assist Walk 10 feet on uneven surfaces activity did not occur: Safety/medical concerns (fatigue, decreased balance, RLE weakness, decreased endurance)         Wheelchair     Assist Will patient use wheelchair at discharge?: Yes Type of Wheelchair: Manual    Wheelchair assist level: Independent Max wheelchair distance: >14ft    Wheelchair 50 feet with 2 turns activity    Assist        Assist Level: Independent   Wheelchair 150 feet activity     Assist      Assist Level: Independent   Medical Problem List and Plan: 1.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 Status post right transpedicular decompression microdiscectomy T10-11 with pedicle screw fixation T10-12 with posterior lateral arthrodesis 05/02/2020. Back brace when out of bed  Continue CIR  PRAFO nightly 2. Antithrombotics: -DVT/anticoagulation: Continue Xarelto  Left femoral and popliteal DVT -antiplatelet therapy: N/A 3. Pain Management:Continue hydrocodone as needed. Robaxin as needed  Gabapentin 300 nightly started on 2/17  Controlled with meds on 3/7  Increased baclofen to 15mg .  Add 10mg  q 8am an dq 1pm , decreased back to 10 nightly on 3/8  Controlled on 3/8  Monitor with increased mobility 4. Mood:Provide emotional support -antipsychotic agents: N/A 5. Neuropsych: This patientiscapable of making decisions on hisown behalf. 6. Skin/Wound Care: Routine skin care  7. Fluids/Electrolytes/Nutrition:Routine in and outs 8. Hypertension.     Tenormin decreased to 12.5mg  DC'd on 3/2  Controlled on 3/8  Monitor with increased mobility 9. Neurogenic bowel andbladder.   PVRs relatively unremarkable  Flomax 0.4 mg nightly  Improved 10. Hyperlipidemia. Continue Lipitor 11.  CKD stage III. Creatinine baseline 1.27.   Creatinine 1.24 on 2/28 12. Nausea:?  Resolved 13.  Sleep disturbance  Melatonin started on 2/16  ECG reviewed, Trazodone 25qhs d/ced as patient urinated on himself at night- could not wake to urinate  Overall improving on 3/8 14.  Acute blood loss anemia  Hemoglobin 9.7 on 2/28  Hemoccult negative   LOS: 24 days A FACE TO FACE EVALUATION WAS PERFORMED  Ankit 5/8 05/30/2020, 10:53 AM

## 2020-05-30 NOTE — Progress Notes (Signed)
Occupational Therapy Session Note  Patient Details  Name: Timothy Ware MRN: [id number] Date of Birth: [birth year]  Today's Date: 05/30/2020 OT Individual Time: 1115-1202 OT Individual Time Calculation (min): 47 min    Short Term Goals: Week 3:  OT Short Term Goal 1 (Week 3): STG = LTGs due to remaining LOS  Skilled Therapeutic Interventions/Progress Updates:    Treatment session with focus on self-care retraining, functional transfers, and sit > stand.  Pt received upright in w/c reporting having just shaved and feeling much better this AM.  Pt agreeable to completing self-care tasks in preparation for d/c home tomorrow.  Pt completed lateral scoot transfers w/c <> drop arm BSC with CGA to close supervision.  Pt is able to boost up from [facility] to allow caregiver to complete clothing management pre and post toileting - and reports this is his preferred method.  Pt has demonstrated ability to maintain standing balance with RW with CGA for standing balance while alternating UE support to pull down pants.  Pt reports preferring to complete by boosting from [facility] - Kmi and wife assisting with clothing.  Pt completed LB dressing this session at bed level, demonstrating improved ability to achieve figure 4 position in supine to simulate washing lower leg and foot and donning pants.  Pt able to complete UB dressing with setup assist.  Engaged in sit> stand x3 from EOB with CGA fading to min assist as he fatigued.  Pt is pleased with progress and reports ready for d/c tomorrow.  Therapy Documentation Precautions:  Precautions Precautions: Fall,Back Precaution Comments: Back precautions, TLSO when OOB Required Braces or Orthoses: Other Brace Spinal Brace: Thoracolumbosacral orthotic,Applied in sitting position Restrictions Weight Bearing Restrictions: No Pain: Pain Assessment Pain Scale: 0-10 Pain Score: 0-No pain   Therapy/Group: Individual Therapy  Rosalio Loud 05/30/2020, 12:16 PM

## 2020-05-30 NOTE — Progress Notes (Addendum)
Physical Therapy Session Note  Patient Details  Name: Timothy Ware MRN: [id number] Date of Birth: 09/24/1934  Today's Date: 05/30/2020 PT Individual Time: 6384-6659 and 9357-0177  PT Individual Time Calculation (min): 40 min and 23 min  Short Term Goals: Week 2:  PT Short Term Goal 1 (Week 2): pt will perform bed mobility with CGA overall PT Short Term Goal 1 - Progress (Week 2): Progressing toward goal PT Short Term Goal 2 (Week 2): pt will be able to tolerate OOB in chair x 2 hours a day PT Short Term Goal 2 - Progress (Week 2): Met PT Short Term Goal 3 (Week 2): pt will verbalize and demonstrate understanding of back precautions PT Short Term Goal 3 - Progress (Week 2): Met Week 3:  PT Short Term Goal 1 (Week 3): STG=LTG due to LOS  Skilled Therapeutic Interventions/Progress Updates:   Treatment Session 1: 9390-3009 40 min Received pt supine in bed, pt agreeable to therapy, and did not state pain level during session. Pt reported feeling much better today and slept well last night. Session with emphasis on discharge planning, functional mobility/transfers, generalized strengthening, dynamic standing balance/coordination, NMR, and improved activity tolerance. Pt transferred supine<>sitting EOB mod I with increased time and donned TLSO with set up assist and shoes with total A for time management purposes. Pt transferred sit<>stand with RW and min A x 3 trials and worked on dynamic standing balance tossing horseshoes alternating using LUE/RUE x 3 trials with CGA for balance. Pt with improved ability to maintain R knee extension with min verbal cues and therapist guarding R knee. Squat<>pivot bed<>WC with supervision. Concluded session with pt sitting in WC, needs within reach, and chair pad alarm on.  Treatment Session 2: 2330-0762 23 min Received pt sidelying, pt agreeable to therapy, and did not state pain level during session. Session with emphasis on functional mobility/transfers,  generalized strengthening, dynamic standing balance/coordination, NMR, and improved activity tolerance. Supine<>sitting EOB with mod I using bedrails and donned TLSO with set up assist and shoes with total A for time management purposes. Pt transferred stand<>pivot with RW and min A x 3 trials and worked on pre-gait stepping with LLE 3x10 reps with emphasis on weight shifting to R and maintaining R knee extension. Pt able to maintain static standing balance without UE support and min A for 15 seconds, but demonstrating L lateral lean due to fear of putting weight on RLE due to buckling. Doffed TLSO and shoes and transferred sit<>supine with supervision. Concluded session with pt supine in bed, needs within reach, and bed alarm on.   Therapy Documentation Precautions:  Precautions Precautions: Fall,Back Precaution Comments: Back precautions, TLSO EOB Required Braces or Orthoses: Spinal Brace Spinal Brace: Thoracolumbosacral orthotic,Applied in sitting position Restrictions Weight Bearing Restrictions: No  Therapy/Group: Individual Therapy Alfonse Alpers PT, DPT   05/30/2020, 7:31 AM

## 2020-05-30 NOTE — Progress Notes (Signed)
Pt has PRAFO boot on. No complaints. RN will continue to monitor.

## 2020-05-30 NOTE — Progress Notes (Signed)
Occupational Therapy Session Note  Patient Details  Name: Timothy Ware MRN: [id number] Date of Birth: 01/11/1935  Today's Date: 05/30/2020 OT Individual Time: 0174-9449 OT Individual Time Calculation (min): 27 min    Short Term Goals: Week 2:  OT Short Term Goal 1 (Week 2): Pt will complete LB dressing with min assist with use of AE as needed OT Short Term Goal 1 - Progress (Week 2): Met OT Short Term Goal 2 (Week 2): Pt will complete toileting with min assist OT Short Term Goal 2 - Progress (Week 2): Progressing toward goal OT Short Term Goal 3 (Week 2): Pt will complete sit > stand with min assist to progress to clothing management at sit> stand level OT Short Term Goal 3 - Progress (Week 2): Met Week 3:  OT Short Term Goal 1 (Week 3): STG = LTGs due to remaining LOS  Skilled Therapeutic Interventions/Progress Updates:    Pt greeted at time of session supine in bed resting, agreeable to OT session. Declined toileting, going to gym, outside, etc. Pt hyperverbal and talking about home life, DC planning home, brace, etc. Also mentioning his past rotator cuff surgery and now feels like limited use of L shoulder around 90* point. Focus of session on patient ed for ROM techniques, wall walks, and eventually strengthening as well with therabands. Pt did supine > sit Supervision with log roll and his own "technique" for sitting EOB for guided shoulder ROM. Sit > supine same manner and alarm on call bell in reach.   Therapy Documentation Precautions:  Precautions Precautions: Fall,Back Precaution Comments: Back precautions, TLSO when OOB Required Braces or Orthoses: Other Brace Spinal Brace: Thoracolumbosacral orthotic,Applied in sitting position Restrictions Weight Bearing Restrictions: No     Therapy/Group: Individual Therapy  Viona Gilmore 05/30/2020, 4:38 PM

## 2020-05-30 NOTE — Progress Notes (Signed)
Pt refused TEDS. PRAFO boot on. Pt had large BM. RN gave maalox for indigestion. Pt stable at this time. RN will continue to monitor.

## 2020-05-31 NOTE — Progress Notes (Signed)
Patient discharged off of unit with all belongings.  Discharge instructions explained by physician assistant to patient and family.  Patient and family received medications from pharmacy.  Patient and family have no further questions at time of discharge.  No complications noted at this time.

## 2020-05-31 NOTE — Progress Notes (Signed)
PROGRESS NOTE   Subjective/Complaints: Patient seen laying in bed this morning.  He states he slept well overnight.  He is?  Discharge medications and follow-up appointments.  ROS: Denies CP, SOB, N/V/D  Objective:   No results found. No results for input(s): WBC, HGB, HCT, PLT in the last 72 hours. No results for input(s): NA, K, CL, CO2, GLUCOSE, BUN, CREATININE, CALCIUM in the last 72 hours.  Intake/Output Summary (Last 24 hours) at 05/31/2020 1050 Last data filed at 05/31/2020 0900 Gross per 24 hour  Intake 720 ml  Output 1800 ml  Net -1080 ml        Physical Exam: Vital Signs Blood pressure 116/74, pulse 86, temperature 98.6 F (37 C), temperature source Oral, resp. rate 16, height 6\' 1"  (1.854 m), weight 80.5 kg, SpO2 92 %.  Constitutional: No distress . Vital signs reviewed. HENT: Normocephalic.  Atraumatic. Eyes: EOMI. No discharge. Cardiovascular: No JVD.  RRR. Respiratory: Normal effort.  No stridor.  Bilateral clear to auscultation. GI: Non-distended.  BS +. Skin: Warm and dry.  Intact. Psych: Normal mood.  Normal behavior. Musc: No edema in extremities.  No tenderness in extremities. Neuro: Alert Motor: B/l UE: 5/5 proximal to distal LLE: 4+/5 proximal to distal, stable RLE: HF and KE 3-/5 (pain component too), ADF 4+/5, stable Sensation remains diminished to light touch RLE, prox more than distal  Assessment/Plan: 1. Functional deficits which require 3+ hours per day of interdisciplinary therapy in a comprehensive inpatient rehab setting.  Physiatrist is providing close team supervision and 24 hour management of active medical problems listed below.  Physiatrist and rehab team continue to assess barriers to discharge/monitor patient progress toward functional and medical goals  Care Tool:  Bathing  Bathing activity did not occur: Refused Body parts bathed by patient: Right arm,Left  arm,Chest,Abdomen,Front perineal area,Buttocks,Right upper leg,Left upper leg,Left lower leg,Right lower leg,Face   Body parts bathed by helper: Right lower leg     Bathing assist Assist Level: Contact Guard/Touching assist     Upper Body Dressing/Undressing Upper body dressing Upper body dressing/undressing activity did not occur (including orthotics): Refused What is the patient wearing?: Pull over shirt,Button up shirt,Orthosis Orthosis activity level: Performed by patient  Upper body assist Assist Level: Set up assist    Lower Body Dressing/Undressing Lower body dressing    Lower body dressing activity did not occur: Refused What is the patient wearing?: Pants     Lower body assist Assist for lower body dressing: Minimal Assistance - Patient > 75%     Toileting Toileting Toileting Activity did not occur and hygiene only): N/A (no void or bm)  Toileting assist Assist for toileting: Minimal Assistance - Patient > 75%     Transfers Chair/bed transfer  Transfers assist  Chair/bed transfer activity did not occur: Refused  Chair/bed transfer assist level: Supervision/Verbal cueing     Locomotion Ambulation   Ambulation assist   Ambulation activity did not occur: Safety/medical concerns (pain and unable to stand)  Assist level: Moderate Assistance - Patient 50 - 74% Assistive device: Parallel bars Max distance: 11ft   Walk 10 feet activity   Assist  Walk 10 feet activity  did not occur: Safety/medical concerns (fatigue, decreased balance, RLE weakness, decreased endurance)        Walk 50 feet activity   Assist Walk 50 feet with 2 turns activity did not occur: Safety/medical concerns (fatigue, decreased balance, RLE weakness, decreased endurance)         Walk 150 feet activity   Assist Walk 150 feet activity did not occur: Safety/medical concerns (fatigue, decreased balance, RLE weakness, decreased endurance)         Walk 10  feet on uneven surface  activity   Assist Walk 10 feet on uneven surfaces activity did not occur: Safety/medical concerns (fatigue, decreased balance, RLE weakness, decreased endurance)         Wheelchair     Assist Will patient use wheelchair at discharge?: Yes Type of Wheelchair: Manual    Wheelchair assist level: Independent Max wheelchair distance: >166ft    Wheelchair 50 feet with 2 turns activity    Assist        Assist Level: Independent   Wheelchair 150 feet activity     Assist      Assist Level: Independent   Medical Problem List and Plan: 1.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 Status post right transpedicular decompression microdiscectomy T10-11 with pedicle screw fixation T10-12 with posterior lateral arthrodesis 05/02/2020. Back brace when out of bed  DC today  Will see patient for hospital follow-up in 1 month post-discharge  PRAFO nightly 2. Antithrombotics: -DVT/anticoagulation: Continue Xarelto  Left femoral and popliteal DVT -antiplatelet therapy: N/A 3. Pain Management:Continue hydrocodone as needed. Robaxin as needed  Gabapentin 300 nightly started on 2/17  Controlled with meds on 3/7  Increased baclofen to 15mg .  Add 10mg  q 8am an dq 1pm , decreased back to 10 nightly on 3/8  Controlled on 3/9  Monitor with increased mobility 4. Mood:Provide emotional support -antipsychotic agents: N/A 5. Neuropsych: This patientiscapable of making decisions on hisown behalf. 6. Skin/Wound Care: Routine skin care  7. Fluids/Electrolytes/Nutrition:Routine in and outs 8. Hypertension.     Tenormin decreased to 12.5mg  DC'd on 3/2  Controlled on 3/9  Monitor with increased mobility 9. Neurogenic bowel andbladder.   PVRs relatively unremarkable  Flomax 0.4 mg nightly  Improved 10. Hyperlipidemia. Continue Lipitor 11. CKD stage III. Creatinine  baseline 1.27.   Creatinine 1.24 on 2/28 12. Nausea:?  Resolved 13.  Sleep disturbance  Melatonin started on 2/16  ECG reviewed, Trazodone 25qhs d/ced as patient urinated on himself at night- could not wake to urinate  Overall improving on 3/9 14.  Acute blood loss anemia  Hemoglobin 9.7 on 2/28  Hemoccult negative  > 30 minutes spent in total in discharge planning between myself and PA regarding aforementioned, as well discussion regarding DME equipment, follow-up appointments, follow-up therapies, discharge medications, discharge recommendations, answering questions.  Please see discharge summary as well.   LOS: 25 days A FACE TO FACE EVALUATION WAS PERFORMED  Yani Lal 5/9 05/31/2020, 10:50 AM

## 2020-06-02 ENCOUNTER — Telehealth: Payer: Self-pay | Admitting: Registered Nurse

## 2020-06-02 NOTE — Telephone Encounter (Signed)
Transitional Care call  Patient name: Timothy Ware DOB: Aug 20, 1934  1. Are you/is patient experiencing any problems since coming home? No a. Are there any questions regarding any aspect of care? No 2. Are there any questions regarding medications administration/dosing? No a. Are meds being taken as prescribed? Yes b. "Patient should review meds with caller to confirm" Medication List Reviewed. 3. Have there been any falls? No 4. Has Home Health been to the house and/or have they contacted you? No. [facility], he was instructed to call on Monday 06/05/2020, if he hasn't heard from [facility], he verbalizes understanding. This provider will call [name] again on Monday 06/05/2020.  a. If not, have you tried to contact them? No b. Can we help you contact them? See Above 5. Are bowels and bladder emptying properly? Yes a. Are there any unexpected incontinence issues? No b. If applicable, is patient following bowel/bladder programs? NA 6. Any fevers, problems with breathing, unexpected pain? No 7. Are there any skin problems or new areas of breakdown? No 8. Has the patient/family member arranged specialty MD follow up (ie cardiology/neurology/renal/surgical/etc.)?  [name] will call Dr Venetia Maxon office on Monday 06/05/2020. a. Can we help arrange? No 9. Does the patient need any other services or support that we can help arrange? No 10. Are caregivers following through as expected in assisting the patient? Yes 11.          Has the patient quit smoking, drinking alcohol, or using drugs as recommended? (                        )  Appointment date/time 06/12/2020  arrival time 1:00 for 1:20 appointment with Jones Bales ANP-C. At [address] [facility] [address]

## 2020-06-08 ENCOUNTER — Telehealth: Payer: Self-pay

## 2020-06-08 NOTE — Telephone Encounter (Signed)
Mr. Mccarry wanted to know how long he will have to take his hospital prescribe medications?   Patient has been advised it will be up to his improvement and his PCP observation.  If refills are need he will contact PCP. If there is an issue he will let [facility] know, ASAP.   Please advise or confirm.

## 2020-06-08 NOTE — Telephone Encounter (Signed)
Timothy Ware cancelled his transitional care visit with PMR. Robbin already instructed him to F/U with PCP, and he verbalize understanding. He will call office if any issues arises.

## 2020-06-12 ENCOUNTER — Encounter: Payer: Medicare Other | Admitting: Registered Nurse

## 2021-08-07 IMAGING — CR DG THORACOLUMBAR SPINE 2V
2 series · 2 of 2 positions shown · non-contrast
Comparison: 03/05/2018

CLINICAL DATA: Intraoperative images for localization

EXAM:
THORACOLUMBAR SPINE 1V

[lateral (1 of 2)]
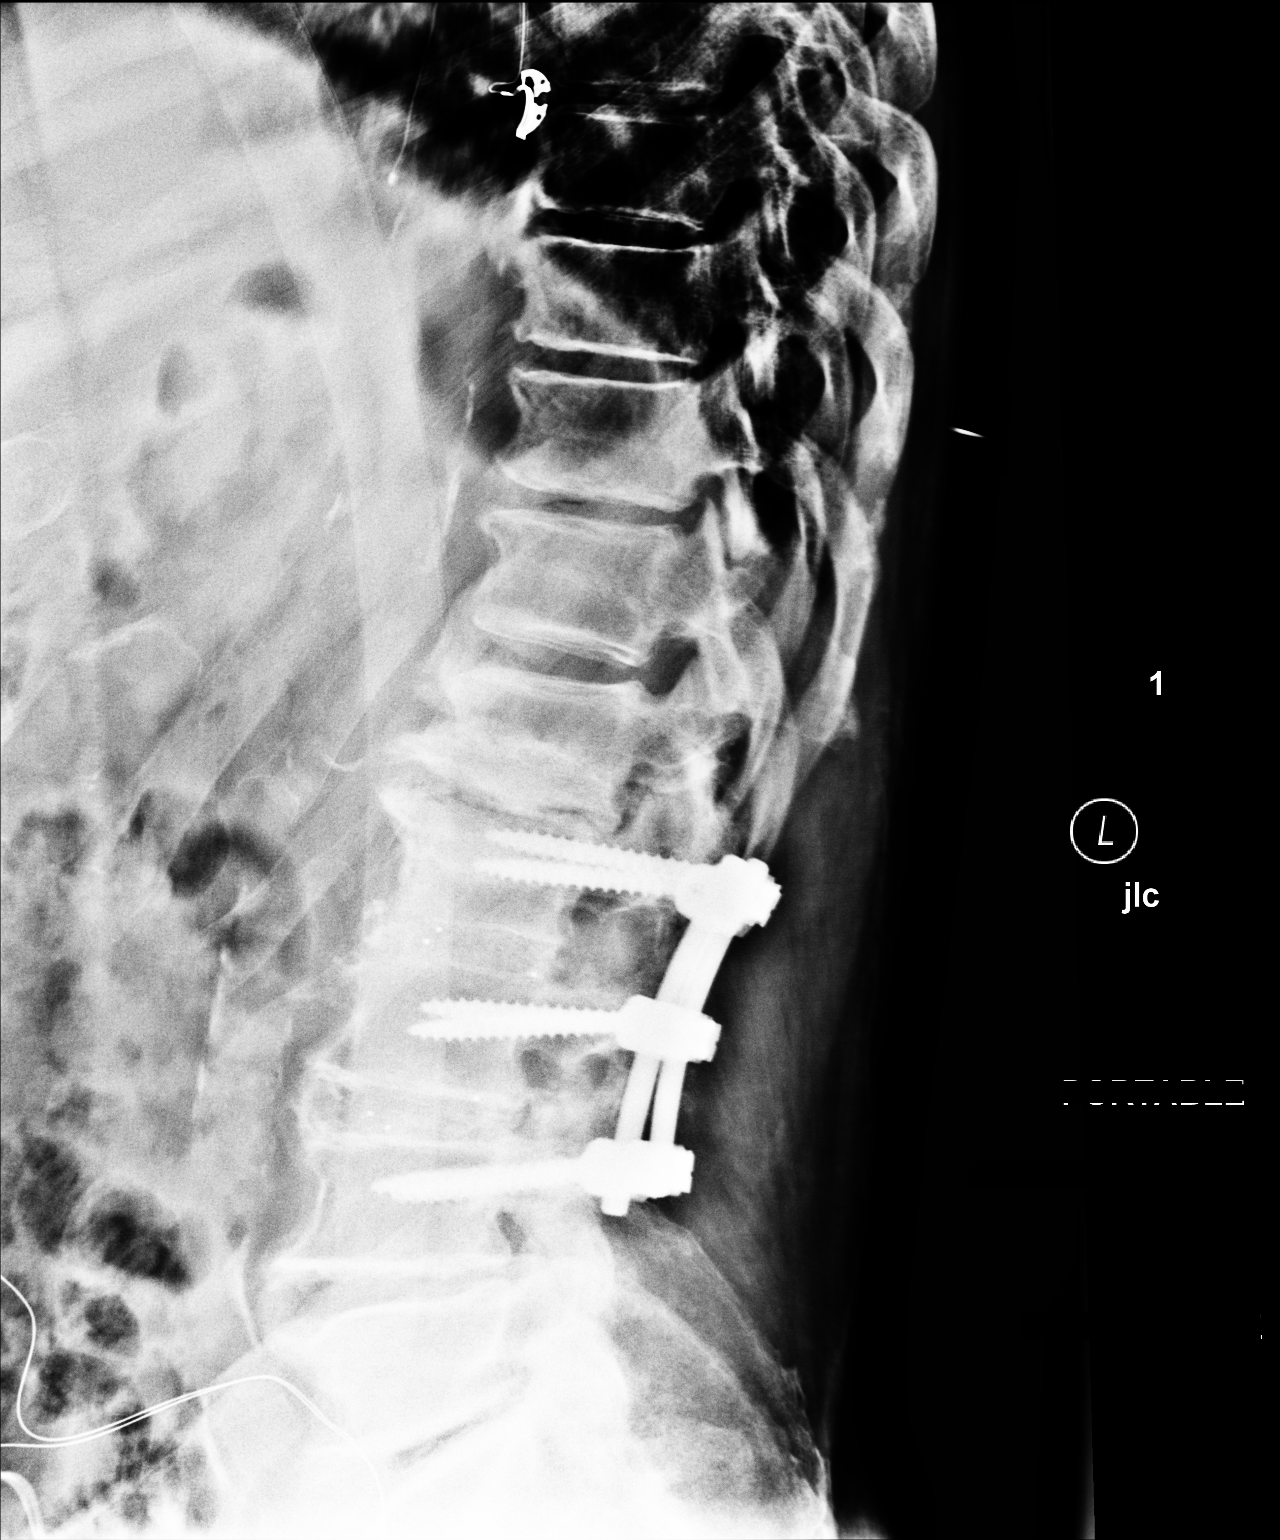

[lateral (2 of 2)]
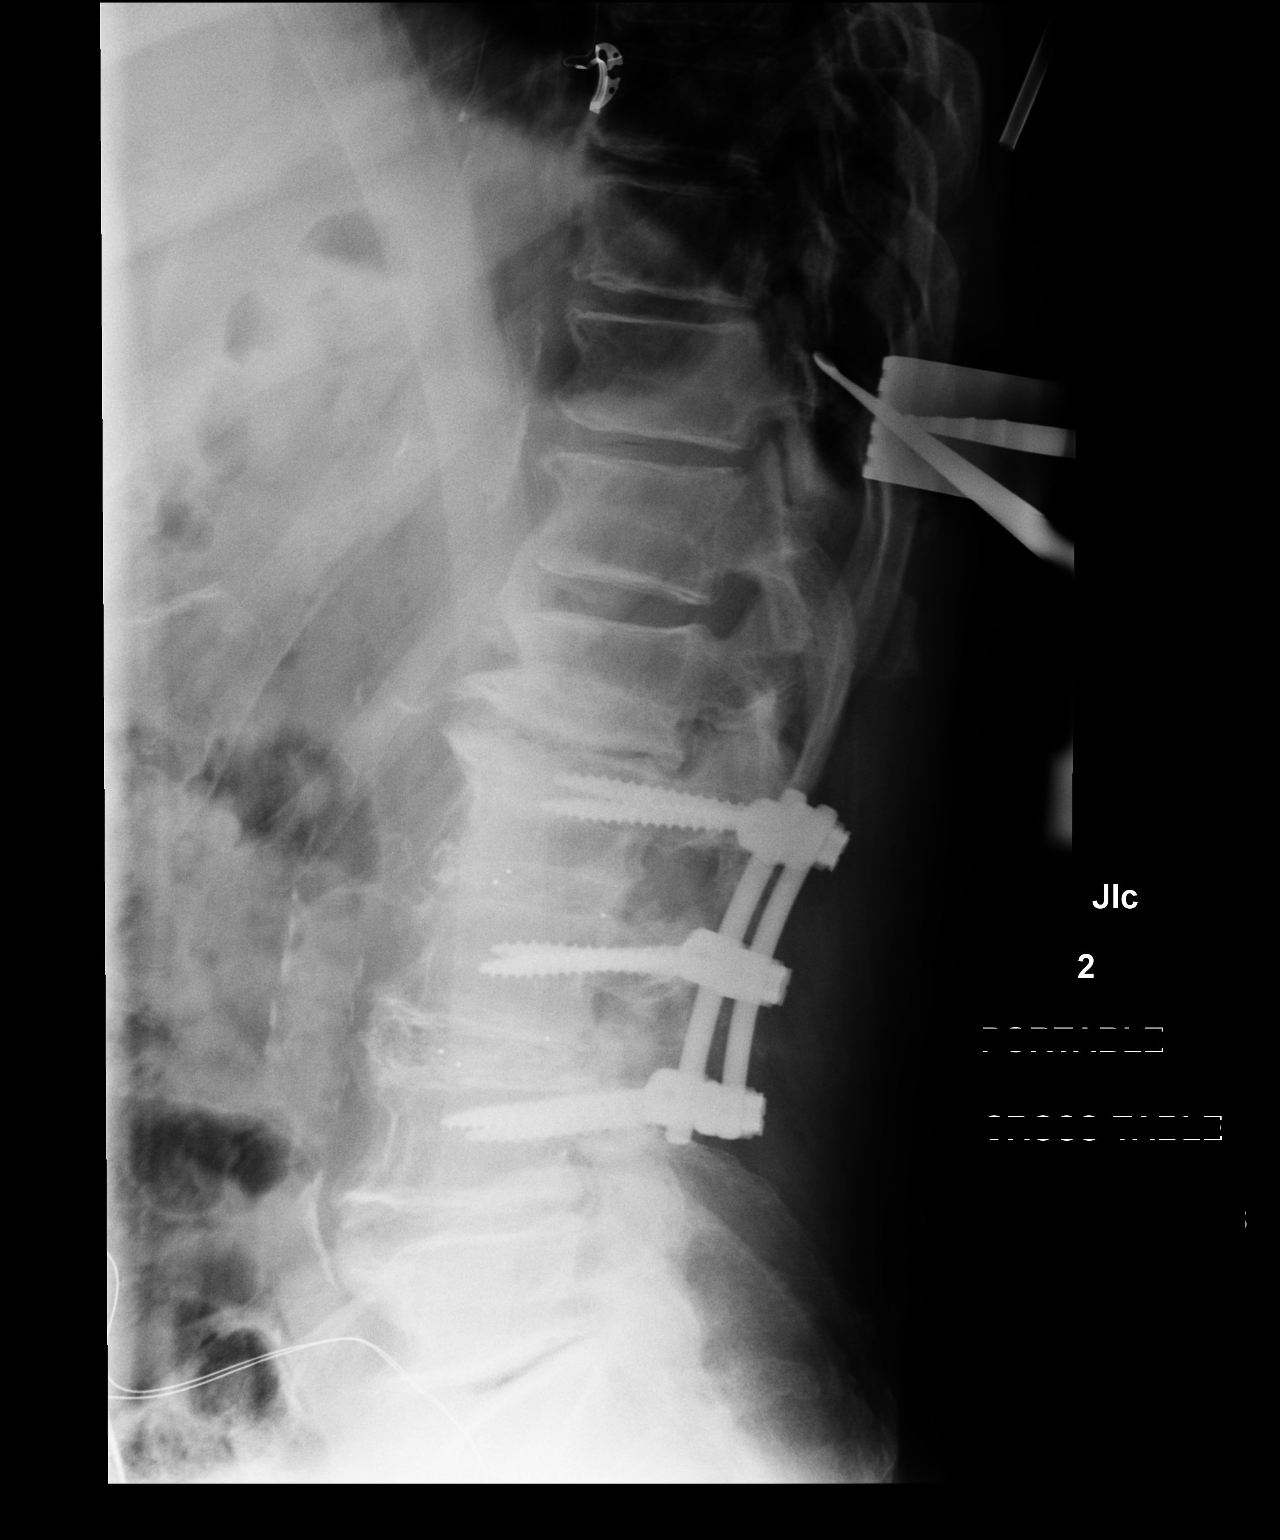

[2 of 2 positions shown; findings below may reference images not displayed]

FINDINGS: Two lateral views of the spine centered at the thoracolumbar
junction are obtained. The first image demonstrates surgical
instrumentation posterior to the T11 level, image limited by
technique. The second image demonstrates surgical instrumentation
posterior to the T11 vertebral body, overlying the expected region
of the central canal. Posterior fusion hardware from L2 through L4
unchanged.
IMPRESSION: 1. Intraoperative exam as above.

## 2023-05-23 ENCOUNTER — Other Ambulatory Visit: Payer: Self-pay
